# Patient Record
Sex: Female | Born: 1960 | Race: White | Hispanic: No | Marital: Married | Smoking: Never smoker
Health system: Southern US, Community
[De-identification: ages and names within clinical notes are randomized; demographics above are authoritative.]

## PROBLEM LIST (undated history)

## (undated) DIAGNOSIS — Z923 Personal history of irradiation: Secondary | ICD-10-CM

## (undated) DIAGNOSIS — Z803 Family history of malignant neoplasm of breast: Secondary | ICD-10-CM

## (undated) DIAGNOSIS — I7300 Raynaud's syndrome without gangrene: Secondary | ICD-10-CM

## (undated) DIAGNOSIS — Z9221 Personal history of antineoplastic chemotherapy: Secondary | ICD-10-CM

## (undated) DIAGNOSIS — F419 Anxiety disorder, unspecified: Secondary | ICD-10-CM

## (undated) DIAGNOSIS — Z808 Family history of malignant neoplasm of other organs or systems: Secondary | ICD-10-CM

## (undated) DIAGNOSIS — J3800 Paralysis of vocal cords and larynx, unspecified: Secondary | ICD-10-CM

## (undated) DIAGNOSIS — E039 Hypothyroidism, unspecified: Secondary | ICD-10-CM

## (undated) DIAGNOSIS — E7800 Pure hypercholesterolemia, unspecified: Secondary | ICD-10-CM

## (undated) HISTORY — DX: Pure hypercholesterolemia, unspecified: E78.00

## (undated) HISTORY — DX: Family history of malignant neoplasm of breast: Z80.3

## (undated) HISTORY — DX: Family history of malignant neoplasm of other organs or systems: Z80.8

## (undated) HISTORY — PX: KNEE SURGERY: SHX244

## (undated) HISTORY — DX: Anxiety disorder, unspecified: F41.9

## (undated) HISTORY — DX: Raynaud's syndrome without gangrene: I73.00

## (undated) HISTORY — DX: Hypothyroidism, unspecified: E03.9

---
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
## 1999-02-02 ENCOUNTER — Other Ambulatory Visit: Admission: RE | Admit: 1999-02-02 | Discharge: 1999-02-02 | Payer: Self-pay | Admitting: *Deleted

## 1999-04-18 ENCOUNTER — Other Ambulatory Visit: Admission: RE | Admit: 1999-04-18 | Discharge: 1999-04-18 | Payer: Self-pay | Admitting: *Deleted

## 2000-12-04 ENCOUNTER — Other Ambulatory Visit: Admission: RE | Admit: 2000-12-04 | Discharge: 2000-12-04 | Payer: Self-pay | Admitting: *Deleted

## 2001-06-25 ENCOUNTER — Emergency Department (HOSPITAL_COMMUNITY): Admission: EM | Admit: 2001-06-25 | Discharge: 2001-06-25 | Payer: Self-pay | Admitting: Emergency Medicine

## 2001-06-28 ENCOUNTER — Ambulatory Visit (HOSPITAL_COMMUNITY): Admission: RE | Admit: 2001-06-28 | Discharge: 2001-06-28 | Payer: Self-pay | Admitting: Emergency Medicine

## 2001-07-02 ENCOUNTER — Ambulatory Visit (HOSPITAL_COMMUNITY): Admission: RE | Admit: 2001-07-02 | Discharge: 2001-07-02 | Payer: Self-pay | Admitting: Emergency Medicine

## 2001-07-09 ENCOUNTER — Ambulatory Visit: Admission: RE | Admit: 2001-07-09 | Discharge: 2001-07-09 | Payer: Self-pay | Admitting: Emergency Medicine

## 2001-07-23 ENCOUNTER — Ambulatory Visit (HOSPITAL_COMMUNITY): Admission: RE | Admit: 2001-07-23 | Discharge: 2001-07-23 | Payer: Self-pay | Admitting: Emergency Medicine

## 2002-01-27 ENCOUNTER — Other Ambulatory Visit: Admission: RE | Admit: 2002-01-27 | Discharge: 2002-01-27 | Payer: Self-pay | Admitting: Obstetrics and Gynecology

## 2003-05-10 ENCOUNTER — Other Ambulatory Visit: Admission: RE | Admit: 2003-05-10 | Discharge: 2003-05-10 | Payer: Self-pay | Admitting: Obstetrics and Gynecology

## 2003-10-24 ENCOUNTER — Emergency Department (HOSPITAL_COMMUNITY): Admission: EM | Admit: 2003-10-24 | Discharge: 2003-10-24 | Payer: Self-pay | Admitting: Emergency Medicine

## 2003-12-16 ENCOUNTER — Encounter: Admission: RE | Admit: 2003-12-16 | Discharge: 2003-12-16 | Payer: Self-pay | Admitting: Obstetrics and Gynecology

## 2004-11-10 ENCOUNTER — Ambulatory Visit (HOSPITAL_BASED_OUTPATIENT_CLINIC_OR_DEPARTMENT_OTHER): Admission: RE | Admit: 2004-11-10 | Discharge: 2004-11-10 | Payer: Self-pay | Admitting: Orthopedic Surgery

## 2004-11-10 ENCOUNTER — Ambulatory Visit (HOSPITAL_COMMUNITY): Admission: RE | Admit: 2004-11-10 | Discharge: 2004-11-10 | Payer: Self-pay | Admitting: Orthopedic Surgery

## 2005-05-18 ENCOUNTER — Encounter: Admission: RE | Admit: 2005-05-18 | Discharge: 2005-05-18 | Payer: Self-pay | Admitting: Obstetrics and Gynecology

## 2006-02-28 ENCOUNTER — Other Ambulatory Visit: Admission: RE | Admit: 2006-02-28 | Discharge: 2006-02-28 | Payer: Self-pay | Admitting: Family Medicine

## 2006-05-24 ENCOUNTER — Encounter: Admission: RE | Admit: 2006-05-24 | Discharge: 2006-05-24 | Payer: Self-pay | Admitting: Family Medicine

## 2007-06-02 ENCOUNTER — Other Ambulatory Visit: Admission: RE | Admit: 2007-06-02 | Discharge: 2007-06-02 | Payer: Self-pay | Admitting: Family Medicine

## 2007-06-13 ENCOUNTER — Encounter: Admission: RE | Admit: 2007-06-13 | Discharge: 2007-06-13 | Payer: Self-pay | Admitting: Family Medicine

## 2008-07-20 ENCOUNTER — Encounter: Admission: RE | Admit: 2008-07-20 | Discharge: 2008-07-20 | Payer: Self-pay | Admitting: Family Medicine

## 2009-07-26 ENCOUNTER — Encounter: Admission: RE | Admit: 2009-07-26 | Discharge: 2009-07-26 | Payer: Self-pay | Admitting: Obstetrics and Gynecology

## 2010-08-14 ENCOUNTER — Encounter
Admission: RE | Admit: 2010-08-14 | Discharge: 2010-08-14 | Payer: Self-pay | Source: Home / Self Care | Attending: Obstetrics and Gynecology | Admitting: Obstetrics and Gynecology

## 2010-10-06 ENCOUNTER — Other Ambulatory Visit: Payer: Self-pay | Admitting: Obstetrics and Gynecology

## 2010-10-06 DIAGNOSIS — Z780 Asymptomatic menopausal state: Secondary | ICD-10-CM

## 2010-10-13 ENCOUNTER — Other Ambulatory Visit: Payer: Self-pay

## 2010-10-30 ENCOUNTER — Other Ambulatory Visit: Payer: Self-pay

## 2010-11-07 ENCOUNTER — Ambulatory Visit
Admission: RE | Admit: 2010-11-07 | Discharge: 2010-11-07 | Disposition: A | Discharge status: Home / Self Care | Payer: BC Managed Care – PPO | Source: Ambulatory Visit | Attending: Obstetrics and Gynecology | Admitting: Obstetrics and Gynecology

## 2010-11-07 DIAGNOSIS — Z780 Asymptomatic menopausal state: Secondary | ICD-10-CM

## 2011-01-12 NOTE — Op Note (Signed)
NAME:  Allison Hanna, Allison Hanna NO.:  [id number]   MEDICAL RECORD NO.:  [id number]          PATIENT TYPE:  AMB   LOCATION:  DSC                          FACILITY:  [facility]   PHYSICIAN:  Thera Flake., M.D.DATE OF BIRTH:  [birth year]   DATE OF PROCEDURE:  11/10/2004  DATE OF DISCHARGE:                                 OPERATIVE REPORT   INDICATIONS FOR PROCEDURE:  50 year old with a symptomatic MRI proven right  medial meniscal tear thought to be amenable to outpatient surgery.   PREOPERATIVE DIAGNOSIS:  Posterior horn complex tear, medial meniscus of the  right knee.   POSTOPERATIVE DIAGNOSIS:  1.  Posterior horn complex tear, medial meniscus of the right knee.  2.  Anterior cruciate ligament laxity.   OPERATION:  Partial medial meniscectomy.   SURGEON:  Dyke Brackett, M.D.   ANESTHESIA:  General.   DESCRIPTION OF PROCEDURE:  The patient was arthroscoped through an  inferomedial and inferolateral portal.  Systematic inspection of the knee  showed the patient to have a normal patellofemoral joint and a normal  lateral compartment with a normal lateral meniscus.  The ACL, I think,  showed some chronic laxity, I would not call this a complete tear, certainly  did not appear to be an acute, estimated under anesthesia, probably a 1-2+  Lachman with a firm end point.  The main pathology versus symptoms is  confined to the medial compartment showing normal articular cartilage with a  complex tear of the posterior horn of the medial meniscus describing  dissection of 30-40% of the meniscus substance back to a good stable rim.  There was, again, no chondral damage whatsoever.  Meniscectomy was checked  both from the inferior medial and inferior lateral portals, the knee drained  free of fluid, the portals were closed with nylon.  The knee was infiltrated  with Marcaine and morphine 20 mL, 4 mg morphine in the joint and an  additional 10 mL of Marcaine into the portals.  A  light compressive sterile  dressing applied and taken to the recovery room in stable condition.      WDC/MEDQ  D:  11/10/2004  T:  11/10/2004  Job:  [id number]

## 2011-03-18 ENCOUNTER — Inpatient Hospital Stay (INDEPENDENT_AMBULATORY_CARE_PROVIDER_SITE_OTHER)
Admission: RE | Admit: 2011-03-18 | Discharge: 2011-03-18 | Disposition: A | Discharge status: Home / Self Care | Payer: BC Managed Care – PPO | Source: Ambulatory Visit | Attending: Family Medicine | Admitting: Family Medicine

## 2011-03-18 DIAGNOSIS — L0100 Impetigo, unspecified: Secondary | ICD-10-CM

## 2011-07-27 ENCOUNTER — Other Ambulatory Visit: Payer: Self-pay | Admitting: Obstetrics and Gynecology

## 2011-07-27 DIAGNOSIS — N644 Mastodynia: Secondary | ICD-10-CM

## 2011-09-04 ENCOUNTER — Ambulatory Visit
Admission: RE | Admit: 2011-09-04 | Discharge: 2011-09-04 | Disposition: A | Discharge status: Home / Self Care | Payer: BC Managed Care – PPO | Source: Ambulatory Visit | Attending: Obstetrics and Gynecology | Admitting: Obstetrics and Gynecology

## 2011-09-04 DIAGNOSIS — N644 Mastodynia: Secondary | ICD-10-CM

## 2012-08-27 DIAGNOSIS — J3800 Paralysis of vocal cords and larynx, unspecified: Secondary | ICD-10-CM

## 2012-08-27 HISTORY — DX: Paralysis of vocal cords and larynx, unspecified: J38.00

## 2012-09-16 ENCOUNTER — Other Ambulatory Visit: Payer: Self-pay | Admitting: Obstetrics and Gynecology

## 2012-09-16 DIAGNOSIS — Z1231 Encounter for screening mammogram for malignant neoplasm of breast: Secondary | ICD-10-CM

## 2012-09-16 DIAGNOSIS — Z803 Family history of malignant neoplasm of breast: Secondary | ICD-10-CM

## 2012-10-13 ENCOUNTER — Ambulatory Visit: Payer: BC Managed Care – PPO

## 2012-12-03 ENCOUNTER — Ambulatory Visit
Admission: RE | Admit: 2012-12-03 | Discharge: 2012-12-03 | Disposition: A | Discharge status: Home / Self Care | Payer: BC Managed Care – PPO | Source: Ambulatory Visit | Attending: Obstetrics and Gynecology | Admitting: Obstetrics and Gynecology

## 2012-12-03 DIAGNOSIS — Z803 Family history of malignant neoplasm of breast: Secondary | ICD-10-CM

## 2012-12-03 DIAGNOSIS — Z1231 Encounter for screening mammogram for malignant neoplasm of breast: Secondary | ICD-10-CM

## 2013-11-10 ENCOUNTER — Ambulatory Visit (INDEPENDENT_AMBULATORY_CARE_PROVIDER_SITE_OTHER): Payer: BC Managed Care – PPO | Admitting: *Deleted

## 2013-11-10 DIAGNOSIS — I83893 Varicose veins of bilateral lower extremities with other complications: Secondary | ICD-10-CM

## 2013-11-10 DIAGNOSIS — I781 Nevus, non-neoplastic: Secondary | ICD-10-CM

## 2013-11-10 NOTE — Progress Notes (Signed)
Patient came in for consultation re her spider and reticular veins which are painful right over them. Options were a bilateral reflux study to r/o venous hypertension/insufficiency or to try sclerotherapy to see if that alone will treat the pain. Patient reports that by the end of the day she does have pain and heaviness. She would like to try the sclerotherapy first as she has had good results in the past (20 years ago). This treatment is medically necessary as she is experiencing pain. I have made an appt for her in a few weeks.

## 2013-11-25 ENCOUNTER — Ambulatory Visit: Payer: BC Managed Care – PPO | Admitting: *Deleted

## 2014-01-13 ENCOUNTER — Other Ambulatory Visit: Payer: Self-pay

## 2014-01-13 DIAGNOSIS — Z1231 Encounter for screening mammogram for malignant neoplasm of breast: Secondary | ICD-10-CM

## 2014-01-29 ENCOUNTER — Encounter (INDEPENDENT_AMBULATORY_CARE_PROVIDER_SITE_OTHER): Payer: Self-pay

## 2014-01-29 ENCOUNTER — Ambulatory Visit
Admission: RE | Admit: 2014-01-29 | Discharge: 2014-01-29 | Disposition: A | Discharge status: Home / Self Care | Payer: BC Managed Care – PPO | Source: Ambulatory Visit

## 2014-01-29 DIAGNOSIS — Z1231 Encounter for screening mammogram for malignant neoplasm of breast: Secondary | ICD-10-CM

## 2014-06-30 ENCOUNTER — Ambulatory Visit: Payer: BC Managed Care – PPO | Admitting: *Deleted

## 2014-07-09 ENCOUNTER — Encounter: Payer: Self-pay | Admitting: Podiatry

## 2014-07-09 ENCOUNTER — Ambulatory Visit (INDEPENDENT_AMBULATORY_CARE_PROVIDER_SITE_OTHER): Payer: BC Managed Care – PPO | Admitting: Podiatry

## 2014-07-09 VITALS — BP 133/71 | HR 73 | Resp 13

## 2014-07-09 DIAGNOSIS — M722 Plantar fascial fibromatosis: Secondary | ICD-10-CM

## 2014-07-09 DIAGNOSIS — M7741 Metatarsalgia, right foot: Secondary | ICD-10-CM

## 2014-07-09 DIAGNOSIS — M7742 Metatarsalgia, left foot: Secondary | ICD-10-CM

## 2014-07-09 NOTE — Progress Notes (Signed)
   Subjective:    Patient ID: Allison Hanna, female    DOB: [birth year], 53 y.o.   MRN: [id number]  HPI 53 year old female presents the office today requesting new orthotics. She presents a has been a patient of Dr. Gara Kroner, who treated her for plantar fasciitis and a possible tear. She has had orthotics made which he states her symptoms in she states that the orthotics are worn out and she is requesting a new pair. She states that she does get some pain intermittently to the heels particularly if she has not worn the orthotics. She previously on that this will therapy procedure performed ultrasounds of the area which seem to alleviate her symptoms. She states that she has an area of scar tissue and the bottom of her foot for which she points to the medial aspect of her foot near the insertion of the plantar fascia. Also states she gets pain to the ball of her foot bilaterally and she feels as if she has no fat pad and the area. She denies any acute injury or trauma to the area. Denies any pain at this time. No other complaints.   Review of Systems  All other systems reviewed and are negative.      Objective:   Physical Exam Objective: AAO x3, NAD DP/PT pulses palpable bilaterally, CRT less than 3 seconds Protective sensation intact with Simms Weinstein monofilament, vibratory sensation intact, Achilles tendon reflex intact Currently there is no tenderness to the plantar medial tubercle of the calcaneus at the insertion the plantar fascia. There is no pain with lateral compression of the calcaneus or with vibratory sensation. No pain on the posterior aspect or along the course of the Achilles tendon.  There is a small area of hypertrophic tissue within the proximal medial band of the plantar fascia which could correlate their scar tissue or a small plantar fibroma. Subjective the area has not changed in size.There is no overlying edema, erythema, increased warmth. MMT 5/5, ROM WNL Prominent  metatarsal heads plantarly 125 with atrophy of the fat pad. There is no specific areas of pinpoint bony tenderness on the metatarsals or the digits and no pain with vibratory sensation. No pain with range of motion of the digits. No calf pain, swelling, warmth, erythema. No open lesions.       Assessment & Plan:  53 year old female with chronic bilateral plantar fasciitis, currently without symptoms. -Patient does not want any x-rays performed at this time. -Treatment options were discussed including alternatives, risks, complications. -At this time the patient was scanned for new orthotic as the patient is requesting new ones. -Continue with stretching exercises as well as ice to the area. -Follow-up once the orthotics are made. The meantime, call the office with any questions, concerns, change in symptoms.

## 2014-07-14 ENCOUNTER — Ambulatory Visit: Payer: BC Managed Care – PPO | Admitting: *Deleted

## 2014-08-05 ENCOUNTER — Encounter: Payer: Self-pay | Admitting: Podiatry

## 2014-09-14 ENCOUNTER — Encounter: Payer: Self-pay | Admitting: *Deleted

## 2014-09-15 ENCOUNTER — Ambulatory Visit (INDEPENDENT_AMBULATORY_CARE_PROVIDER_SITE_OTHER): Payer: BLUE CROSS/BLUE SHIELD | Admitting: *Deleted

## 2014-09-15 DIAGNOSIS — I8393 Asymptomatic varicose veins of bilateral lower extremities: Secondary | ICD-10-CM

## 2014-09-15 NOTE — Progress Notes (Signed)
X=.3% Sotradecol administered with a 27g butterfly.  Patient received a total of 6cc.  Treated all areas of concern with sclero and CL ([place]). Easy access, tol well, follow prn.  Cutaneous Laser:pulsed mode  810j/cm2 400 ms delay  13 ms Duration 0.5 spot  Total pulses: 603 Total energy 954  Total time::)&  Photos: Yes.    Compression stockings applied: Yes.

## 2015-05-16 ENCOUNTER — Other Ambulatory Visit: Payer: Self-pay

## 2015-05-16 DIAGNOSIS — Z1231 Encounter for screening mammogram for malignant neoplasm of breast: Secondary | ICD-10-CM

## 2015-06-16 ENCOUNTER — Ambulatory Visit
Admission: RE | Admit: 2015-06-16 | Discharge: 2015-06-16 | Disposition: A | Discharge status: Home / Self Care | Payer: BLUE CROSS/BLUE SHIELD | Source: Ambulatory Visit

## 2015-06-16 DIAGNOSIS — Z1231 Encounter for screening mammogram for malignant neoplasm of breast: Secondary | ICD-10-CM

## 2015-11-03 ENCOUNTER — Ambulatory Visit: Payer: BLUE CROSS/BLUE SHIELD | Admitting: Sports Medicine

## 2016-06-05 ENCOUNTER — Other Ambulatory Visit: Payer: Self-pay | Admitting: Obstetrics and Gynecology

## 2016-06-05 DIAGNOSIS — Z1231 Encounter for screening mammogram for malignant neoplasm of breast: Secondary | ICD-10-CM

## 2016-06-18 ENCOUNTER — Ambulatory Visit
Admission: RE | Admit: 2016-06-18 | Discharge: 2016-06-18 | Disposition: A | Discharge status: Home / Self Care | Payer: BLUE CROSS/BLUE SHIELD | Source: Ambulatory Visit | Attending: Obstetrics and Gynecology | Admitting: Obstetrics and Gynecology

## 2016-06-18 DIAGNOSIS — Z1231 Encounter for screening mammogram for malignant neoplasm of breast: Secondary | ICD-10-CM

## 2017-05-14 ENCOUNTER — Other Ambulatory Visit: Payer: Self-pay | Admitting: Obstetrics and Gynecology

## 2017-05-14 DIAGNOSIS — Z1231 Encounter for screening mammogram for malignant neoplasm of breast: Secondary | ICD-10-CM

## 2017-05-16 ENCOUNTER — Other Ambulatory Visit: Payer: Self-pay | Admitting: Obstetrics and Gynecology

## 2017-05-16 DIAGNOSIS — N644 Mastodynia: Secondary | ICD-10-CM

## 2017-06-25 ENCOUNTER — Ambulatory Visit
Admission: RE | Admit: 2017-06-25 | Discharge: 2017-06-25 | Disposition: A | Discharge status: Home / Self Care | Payer: BLUE CROSS/BLUE SHIELD | Source: Ambulatory Visit | Attending: Obstetrics and Gynecology | Admitting: Obstetrics and Gynecology

## 2017-06-25 ENCOUNTER — Ambulatory Visit
Admission: RE | Admit: 2017-06-25 | Discharge: 2017-06-25 | Disposition: A | Discharge status: Home / Self Care | Payer: Managed Care, Other (non HMO) | Source: Ambulatory Visit | Attending: Obstetrics and Gynecology | Admitting: Obstetrics and Gynecology

## 2017-06-25 DIAGNOSIS — N644 Mastodynia: Secondary | ICD-10-CM

## 2018-11-04 ENCOUNTER — Other Ambulatory Visit: Payer: Self-pay | Admitting: Obstetrics and Gynecology

## 2018-11-04 DIAGNOSIS — Z1231 Encounter for screening mammogram for malignant neoplasm of breast: Secondary | ICD-10-CM

## 2018-12-02 ENCOUNTER — Ambulatory Visit: Payer: Managed Care, Other (non HMO)

## 2019-01-13 ENCOUNTER — Ambulatory Visit: Payer: Managed Care, Other (non HMO)

## 2019-01-28 ENCOUNTER — Other Ambulatory Visit: Payer: Self-pay | Admitting: Obstetrics and Gynecology

## 2019-01-28 DIAGNOSIS — N6320 Unspecified lump in the left breast, unspecified quadrant: Secondary | ICD-10-CM

## 2019-02-11 ENCOUNTER — Ambulatory Visit
Admission: RE | Admit: 2019-02-11 | Discharge: 2019-02-11 | Disposition: A | Discharge status: Home / Self Care | Payer: Managed Care, Other (non HMO) | Source: Ambulatory Visit | Attending: Obstetrics and Gynecology | Admitting: Obstetrics and Gynecology

## 2019-02-11 ENCOUNTER — Other Ambulatory Visit: Payer: Self-pay

## 2019-02-11 ENCOUNTER — Other Ambulatory Visit: Payer: Self-pay | Admitting: Obstetrics and Gynecology

## 2019-02-11 DIAGNOSIS — N6320 Unspecified lump in the left breast, unspecified quadrant: Secondary | ICD-10-CM

## 2019-02-12 ENCOUNTER — Ambulatory Visit
Admission: RE | Admit: 2019-02-12 | Discharge: 2019-02-12 | Disposition: A | Discharge status: Home / Self Care | Payer: Managed Care, Other (non HMO) | Source: Ambulatory Visit | Attending: Obstetrics and Gynecology | Admitting: Obstetrics and Gynecology

## 2019-02-12 ENCOUNTER — Other Ambulatory Visit: Payer: Self-pay

## 2019-02-12 DIAGNOSIS — N6320 Unspecified lump in the left breast, unspecified quadrant: Secondary | ICD-10-CM

## 2019-02-13 ENCOUNTER — Telehealth: Payer: Self-pay | Admitting: *Deleted

## 2019-02-13 NOTE — Telephone Encounter (Signed)
Confirmed [facility] for 02/18/19 at 1215.  Instructions and contact information given.

## 2019-02-17 ENCOUNTER — Other Ambulatory Visit: Payer: Self-pay | Admitting: *Deleted

## 2019-02-17 DIAGNOSIS — Z170 Estrogen receptor positive status [ER+]: Secondary | ICD-10-CM | POA: Insufficient documentation

## 2019-02-17 DIAGNOSIS — C50512 Malignant neoplasm of lower-outer quadrant of left female breast: Secondary | ICD-10-CM

## 2019-02-18 ENCOUNTER — Encounter: Payer: Self-pay | Admitting: Oncology

## 2019-02-18 ENCOUNTER — Inpatient Hospital Stay (HOSPITAL_BASED_OUTPATIENT_CLINIC_OR_DEPARTMENT_OTHER): Payer: Managed Care, Other (non HMO) | Admitting: Oncology

## 2019-02-18 ENCOUNTER — Encounter: Payer: Self-pay | Admitting: Physical Therapy

## 2019-02-18 ENCOUNTER — Inpatient Hospital Stay: Payer: Managed Care, Other (non HMO)

## 2019-02-18 ENCOUNTER — Ambulatory Visit: Payer: Managed Care, Other (non HMO) | Attending: General Surgery | Admitting: Physical Therapy

## 2019-02-18 ENCOUNTER — Other Ambulatory Visit: Payer: Self-pay

## 2019-02-18 ENCOUNTER — Other Ambulatory Visit: Payer: Self-pay | Admitting: General Surgery

## 2019-02-18 ENCOUNTER — Ambulatory Visit
Admission: RE | Admit: 2019-02-18 | Discharge: 2019-02-18 | Disposition: A | Discharge status: Home / Self Care | Payer: Managed Care, Other (non HMO) | Source: Ambulatory Visit | Attending: Radiation Oncology | Admitting: Radiation Oncology

## 2019-02-18 VITALS — BP 137/74 | HR 71 | Temp 98.2°F | Resp 18 | Ht 66.5 in | Wt 158.7 lb

## 2019-02-18 DIAGNOSIS — C50512 Malignant neoplasm of lower-outer quadrant of left female breast: Secondary | ICD-10-CM | POA: Insufficient documentation

## 2019-02-18 DIAGNOSIS — R293 Abnormal posture: Secondary | ICD-10-CM | POA: Insufficient documentation

## 2019-02-18 DIAGNOSIS — E039 Hypothyroidism, unspecified: Secondary | ICD-10-CM

## 2019-02-18 DIAGNOSIS — Z170 Estrogen receptor positive status [ER+]: Secondary | ICD-10-CM

## 2019-02-18 LAB — CBC WITH DIFFERENTIAL (CANCER CENTER ONLY)
Abs Immature Granulocytes: 0.01 10*3/uL (ref 0.00–0.07)
Basophils Absolute: 0.1 10*3/uL (ref 0.0–0.1)
Basophils Relative: 2 %
Eosinophils Absolute: 0.1 10*3/uL (ref 0.0–0.5)
Eosinophils Relative: 2 %
HCT: 36.7 % (ref 36.0–46.0)
Hemoglobin: 12.3 g/dL (ref 12.0–15.0)
Immature Granulocytes: 0 %
Lymphocytes Relative: 23 %
Lymphs Abs: 1 10*3/uL (ref 0.7–4.0)
MCH: 30.5 pg (ref 26.0–34.0)
MCHC: 33.5 g/dL (ref 30.0–36.0)
MCV: 91.1 fL (ref 80.0–100.0)
Monocytes Absolute: 0.4 10*3/uL (ref 0.1–1.0)
Monocytes Relative: 8 %
Neutro Abs: 2.9 10*3/uL (ref 1.7–7.7)
Neutrophils Relative %: 65 %
Platelet Count: 297 10*3/uL (ref 150–400)
RBC: 4.03 MIL/uL (ref 3.87–5.11)
RDW: 11.9 % (ref 11.5–15.5)
WBC Count: 4.4 10*3/uL (ref 4.0–10.5)
nRBC: 0 % (ref 0.0–0.2)

## 2019-02-18 LAB — CMP (CANCER CENTER ONLY)
ALT: 14 U/L (ref 0–44)
AST: 16 U/L (ref 15–41)
Albumin: 4.1 g/dL (ref 3.5–5.0)
Alkaline Phosphatase: 47 U/L (ref 38–126)
Anion gap: 8 (ref 5–15)
BUN: 16 mg/dL (ref 6–20)
CO2: 29 mmol/L (ref 22–32)
Calcium: 9.2 mg/dL (ref 8.9–10.3)
Chloride: 99 mmol/L (ref 98–111)
Creatinine: 0.95 mg/dL (ref 0.44–1.00)
GFR, Est AFR Am: >60 mL/min (ref >60)
GFR, Estimated: >60 mL/min (ref >60)
Glucose, Bld: 92 mg/dL (ref 70–99)
Potassium: 4 mmol/L (ref 3.5–5.1)
Sodium: 136 mmol/L (ref 135–145)
Total Bilirubin: 0.3 mg/dL (ref 0.3–1.2)
Total Protein: 7.5 g/dL (ref 6.5–8.1)

## 2019-02-18 NOTE — Progress Notes (Signed)
Radiation Oncology         [phone] ________________________________  Multidisciplinary Breast Oncology Clinic [facility]) Initial Outpatient Consultation  Name: Allison Hanna MRN: [id number]  Date: 02/18/2019  DOB: 03-Mar-1961  WE:[id number], Mauro Kaufmann, MD  Fanny Skates, MD   REFERRING PHYSICIAN: Fanny Skates, MD  DIAGNOSIS: The encounter diagnosis was Malignant neoplasm of lower-outer quadrant of left breast of female, estrogen receptor positive ([place]).  Stage IA (T1b, N0) Left Breast LOQ, Invasive Mammary Carcinoma, ER+ / PR- / Her2+, Grade- pending    ICD-10-CM   1. Malignant neoplasm of lower-outer quadrant of left breast of female, estrogen receptor positive ([place])  C50.512    Z17.0     HISTORY OF PRESENT ILLNESS::Allison Hanna is a 58 y.o. female who is presenting to the office today for evaluation of her newly diagnosed breast cancer. She is doing well overall.   Pt presented after palpating a mass in the left breast.  She underwent bilateral diagnostic mammography with tomography and left breast ultrasonography at [facility] on 02/11/19 showing: a suspicious left breast mass at 4 o'clock, 5cmfn, measuring 8 x 8 x 5 mm.  Biopsy on 02/12/19 showed: invasive mammary carcinoma in the left breast 4 o'clock position. Prognostic indicators significant for: estrogen receptor, 100% positive with strong staining intensity. Progesterone receptor negative. Proliferation marker Ki67 at 25%. HER2 positive.  Menarche: 58 years old Age at first live birth: 58 years old GP: GxP3 LMP: 2007 Contraceptive: yes, pill for about 8 years, no complications HRT: no   The patient was referred today for presentation in the multidisciplinary conference.  Radiology studies and pathology slides were presented there for review and discussion of treatment options.  A consensus was discussed regarding potential next steps.  PREVIOUS RADIATION THERAPY: No  PAST MEDICAL HISTORY:  has a past  medical history of Anxiety, High cholesterol, Hypothyroid, Paralyzed vocal cords, and Raynauds syndrome.    PAST SURGICAL HISTORY: Past Surgical History:  Procedure Laterality Date   KNEE SURGERY Bilateral    arthroscopy for menicus tears   WRIST SURGERY      FAMILY HISTORY: family history includes Breast cancer in her paternal aunt and paternal aunt; Breast cancer (age of onset: 96) in her mother; Heart attack in her father; Skin cancer in her brother; Uterine cancer in her paternal aunt.  SOCIAL HISTORY:  reports that she has never smoked. She has never used smokeless tobacco. She reports current alcohol use of about 2.0 standard drinks of alcohol per week. She reports that she does not use drugs.  ALLERGIES: Patient has no known allergies.  MEDICATIONS:  Current Outpatient Medications  Medication Sig Dispense Refill   calcium carbonate (OS-CAL) 600 MG TABS tablet Take 1,200 mg by mouth 2 (two) times daily with a meal.     Cholecalciferol (VITAMIN D3) 25 MCG (1000 UT) CAPS Take 2,000 Units by mouth daily.     estradiol (ESTRACE) 0.1 MG/GM vaginal cream Place 1 Applicatorful vaginally at bedtime.     Levothyroxine Sodium (SYNTHROID PO) Take 75 mcg by mouth daily.      Magnesium 250 MG TABS Take 250 mg by mouth daily.     vitamin C (ASCORBIC ACID) 500 MG tablet Take 500 mg by mouth daily.     No current facility-administered medications for this encounter.     REVIEW OF SYSTEMS:  REVIEW OF SYSTEMS: A 10+ POINT REVIEW OF SYSTEMS WAS OBTAINED including neurology, dermatology, psychiatry, cardiac, respiratory, lymph, extremities, GI, GU, musculoskeletal, constitutional, reproductive,  HEENTVinnie Hanna reports loss of sleep, breast soreness and pain with palpable lump, back pain, anxiety, and wearing glasses. The patient denies unusual headaches, visual changes, nausea, vomiting, stiff neck, dizziness, or gait imbalance. There has been no cough, phlegm production, or pleurisy, no chest  pain or pressure, and no change in bowel or bladder habits. The patient denies fever, rash, bleeding, unexplained fatigue or unexplained weight loss. A detailed review of systems was otherwise entirely negative.   PHYSICAL EXAM:  Vitals with BMI 02/18/2019  Height 5' 6.5"  Weight 158 lbs 11 oz  BMI 99.35  Systolic 701  Diastolic 74  Pulse 71  Respirations 18   Lungs are clear to auscultation bilaterally. Heart has regular rate and rhythm. No palpable cervical, supraclavicular, or axillary adenopathy. Abdomen soft, non-tender, normal bowel sounds. Breast: Right breast with no palpable mass, nipple discharge, or bleeding. LOQ of left breast pt has some bruising from biopsy as well as a small palpable mass measuring approximately 1 cm, which is superficial on exam but no skin involvement.     ECOG = 0  LABORATORY DATA:  Lab Results  Component Value Date   WBC 4.4 02/18/2019   HGB 12.3 02/18/2019   HCT 36.7 02/18/2019   MCV 91.1 02/18/2019   PLT 297 02/18/2019   Lab Results  Component Value Date   NA 136 02/18/2019   K 4.0 02/18/2019   CL 99 02/18/2019   CO2 29 02/18/2019   Lab Results  Component Value Date   ALT 14 02/18/2019   AST 16 02/18/2019   ALKPHOS 47 02/18/2019   BILITOT 0.3 02/18/2019    PULMONARY FUNCTION TEST:   Recent Review Flowsheet Data    There is no flowsheet data to display.      RADIOGRAPHY: US Breast Ltd Uni Left Inc Axilla  Result Date: 02/11/2019 CLINICAL DATA:  Patient complains of a palpable mass in the left breast. EXAM: DIGITAL DIAGNOSTIC BILATERAL MAMMOGRAM WITH CAD AND TOMO ULTRASOUND LEFT BREAST COMPARISON:  Previous exam(s). ACR Breast Density Category b: There are scattered areas of fibroglandular density. FINDINGS: There is a spiculated mass in the lower outer quadrant of the left breast. There are no malignant type microcalcifications in the left breast. No suspicious mass or malignant type microcalcifications identified in the right  breast. Mammographic images were processed with CAD. On physical exam, I palpate a discrete mass in the left breast at 4 o'clock 5 cm from the nipple. Targeted ultrasound is performed, showing an irregular hypoechoic mass in the left breast at 4 o'clock 5 cm from the nipple measuring 8 x 8 x 5 mm. Sonographic evaluation of the left axilla does not show any enlarged adenopathy. IMPRESSION: Suspicious left breast mass. RECOMMENDATION: Ultrasound-guided core biopsy of the mass in the 4 o'clock region of the left breast is recommended. The biopsy will be scheduled the patient's convenience. I have discussed the findings and recommendations with the patient. Results were also provided in writing at the conclusion of the visit. If applicable, a reminder letter will be sent to the patient regarding the next appointment. BI-RADS CATEGORY  5: Highly suggestive of malignancy. Electronically Signed   By: Lillia Mountain M.D.   On: 02/11/2019 13:52   Mm Diag Breast Tomo Bilateral  Result Date: 02/11/2019 CLINICAL DATA:  Patient complains of a palpable mass in the left breast. EXAM: DIGITAL DIAGNOSTIC BILATERAL MAMMOGRAM WITH CAD AND TOMO ULTRASOUND LEFT BREAST COMPARISON:  Previous exam(s). ACR Breast Density Category b: There are scattered  areas of fibroglandular density. FINDINGS: There is a spiculated mass in the lower outer quadrant of the left breast. There are no malignant type microcalcifications in the left breast. No suspicious mass or malignant type microcalcifications identified in the right breast. Mammographic images were processed with CAD. On physical exam, I palpate a discrete mass in the left breast at 4 o'clock 5 cm from the nipple. Targeted ultrasound is performed, showing an irregular hypoechoic mass in the left breast at 4 o'clock 5 cm from the nipple measuring 8 x 8 x 5 mm. Sonographic evaluation of the left axilla does not show any enlarged adenopathy. IMPRESSION: Suspicious left breast mass.  RECOMMENDATION: Ultrasound-guided core biopsy of the mass in the 4 o'clock region of the left breast is recommended. The biopsy will be scheduled the patient's convenience. I have discussed the findings and recommendations with the patient. Results were also provided in writing at the conclusion of the visit. If applicable, a reminder letter will be sent to the patient regarding the next appointment. BI-RADS CATEGORY  5: Highly suggestive of malignancy. Electronically Signed   By: Lillia Mountain M.D.   On: 02/11/2019 13:52   Mm Clip Placement Left  Result Date: 02/12/2019 CLINICAL DATA:  Post ultrasound-guided biopsy of the mass in the left breast at the 4 o'clock position. EXAM: DIAGNOSTIC LEFT MAMMOGRAM POST ULTRASOUND BIOPSY COMPARISON:  Previous exam(s). FINDINGS: Mammographic images were obtained following ultrasound guided biopsy of a mass in the left breast at the 4 o'clock position. A ribbon shaped biopsy marking clip is present at the site of the biopsied mass in the left breast at the 4 o'clock position. IMPRESSION: Ribbon shaped biopsy marking clip at site of biopsied mass in the left breast at the 4 o'clock position. Final Assessment: Post Procedure Mammograms for Marker Placement Electronically Signed   By: Everlean Alstrom M.D.   On: 02/12/2019 14:47   [place] Lt Breast Bx W Loc Dev 1st Lesion Img Bx Spec US Guide  Addendum Date: 02/17/2019   ADDENDUM REPORT: 02/17/2019 15:11 ADDENDUM: Pathology revealed INVASIVE MAMMARY CARCINOMA of the LEFT breast, 4 o'clock position. This was found to be concordant by Dr. Everlean Alstrom. Pathology results were discussed with the patient by telephone. The patient reported doing well after the biopsy with tenderness at the site. Post biopsy instructions and care were reviewed and questions were answered. The patient was encouraged to call [facility] for any additional concerns. The patient was referred to [facility] on February 18, 2019. Pathology results reported by Stacie Acres, RN on 02/17/2019. Electronically Signed   By: Everlean Alstrom M.D.   On: 02/17/2019 15:11   Result Date: 02/17/2019 CLINICAL DATA:  58 year old female with a suspicious mass in the left breast at the 4 o'clock position. EXAM: ULTRASOUND GUIDED LEFT BREAST CORE NEEDLE BIOPSY COMPARISON:  Previous exam(s). FINDINGS: I met with the patient and we discussed the procedure of ultrasound-guided biopsy, including benefits and alternatives. We discussed the high likelihood of a successful procedure. We discussed the risks of the procedure, including infection, bleeding, tissue injury, clip migration, and inadequate sampling. Informed written consent was given. The usual time-out protocol was performed immediately prior to the procedure. Lesion quadrant: Lower outer Using sterile technique and 1% Lidocaine as local anesthetic, under direct ultrasound visualization, a 12 gauge spring-loaded device was used to perform biopsy of the mass in the left breast at the 4 o'clock  position using a lateral to medial approach. At the conclusion of the procedure a ribbon shaped tissue marker clip was deployed into the biopsy cavity. Follow up 2 view mammogram was performed and dictated separately. IMPRESSION: Ultrasound guided biopsy of the mass in the left breast at the 4 o'clock position. No apparent complications. Electronically Signed: By: Everlean Alstrom M.D. On: 02/12/2019 14:49      IMPRESSION: Stage IA (T1b, N0) Left Breast LOQ, Invasive Mammary (ductal) Carcinoma, ER+ / PR- / Her2+, Grade- pending  Patient will be a good candidate for breast conservation with radiotherapy to left breast. We discussed the general course of radiation, potential side effects, and toxicities with radiation and the patient is interested in this approach.    PLAN:  1. Left lumpectomy with SLN 2. Adjuvant chemotherapy  with paclitaxel and trastuzumab (trastuzumab to be continued to complete 1 year) 3. Adjuvant radiation therapy 4. Aromatase inhibitor 5. Genetics testing   ------------------------------------------------  Blair Promise, PhD, MD This document serves as a record of services personally performed by Gery Pray, MD. It was created on his behalf by Mary-Margaret Loma Messing, a trained medical scribe. The creation of this record is based on the scribe's personal observations and the provider's statements to them. This document has been checked and approved by the attending provider.

## 2019-02-18 NOTE — Progress Notes (Signed)
START ON PATHWAY REGIMEN - Breast   Paclitaxel Weekly + Trastuzumab Weekly:   Administer weekly:     Paclitaxel      Trastuzumab-xxxx      Trastuzumab-xxxx   **Always confirm dose/schedule in your pharmacy ordering system**  Trastuzumab (Maintenance - NO Loading Dose):   A cycle is every 21 days:     Trastuzumab-xxxx   **Always confirm dose/schedule in your pharmacy ordering system**  Patient Characteristics: Postoperative without Neoadjuvant Therapy (Pathologic Staging), Invasive Disease, Adjuvant Therapy, HER2 Positive, ER Positive, Node Negative, pT1b, pN0/N46m, Chemotherapy Indicated Therapeutic Status: Postoperative without Neoadjuvant Therapy (Pathologic Staging) AJCC Grade: G2 AJCC N Category: pN0 AJCC M Category: cM0 ER Status: Positive (+) AJCC 8 Stage Grouping: IA HER2 Status: Positive (+) Oncotype Dx Recurrence Score: Not Appropriate AJCC T Category: pT1 PR Status: Negative (-) Intervention Indicated: Chemotherapy Intent of Therapy: Curative Intent, Not Discussed with Patient

## 2019-02-18 NOTE — Progress Notes (Signed)
[facility]  [phone] [phone]     ID: BOBBI YOUNT DOB: 1960/09/21  MR#: [id number]  CBJ#:[id number]  Patient Care Team: Merrilee Seashore, MD as PCP - General (Internal Medicine) Mauro Kaufmann, RN as Oncology Nurse Navigator Rockwell Germany, RN as Oncology Nurse Navigator Yerick Eggebrecht, Virgie Dad, MD as Consulting Physician (Oncology) Gery Pray, MD as Consulting Physician (Radiation Oncology) Fanny Skates, MD as Consulting Physician (General Surgery) Brien Few, MD as Consulting Physician (Obstetrics and Gynecology) Chauncey Cruel, MD OTHER MD:  CHIEF COMPLAINT: Estrogen receptor positive breast cancer  CURRENT TREATMENT: Awaiting definitive surgery   HISTORY OF CURRENT ILLNESS: "Katrinka" presented with a palpable left breast mass. She initially experienced soreness to her left breast around April 2020 and tried to get a mammogram at this point, but due to pandemic concerns, the scan was delayed.  She did not actually feel a mass in the breast until June 1.  She underwent bilateral diagnostic mammography with tomography and left breast ultrasonography at [facility] on 02/11/2019 showing: breast density category B; irregular hyperechoic mass in the left breast at 4 o'clock 5 cm from the nipple measuring 8 mm; no enlarged adenopathy in the left axilla.  Accordingly on 02/12/2019 she proceeded to biopsy of the left breast area in question. The pathology from this procedure ([id number]) showed: invasive mammary carcinoma, e-cadherin positive. Prognostic indicators significant for: estrogen receptor, 100% positive with strong staining intensity and progesterone receptor, 0% negative. Proliferation marker Ki67 at 25%. HER2 positive by immunohistochemistry, (3+).  The patient's subsequent history is as detailed below.   INTERVAL HISTORY: Lu was evaluated in the multidisciplinary breast cancer clinic on 02/18/2019. Her case  was also presented at the multidisciplinary breast cancer conference on the same day. At that time a preliminary plan was proposed: Breast conserving surgery with sentinel lymph node sampling, port placement, adjuvant chemotherapy to consist of paclitaxel and trastuzumab, adjuvant radiation, antiestrogens, and genetics   REVIEW OF SYSTEMS: Beata reports loss of sleep, breast soreness and pain with palpable lump, back pain, anxiety, and wearing glasses. The patient denies unusual headaches, visual changes, nausea, vomiting, stiff neck, dizziness, or gait imbalance. There has been no cough, phlegm production, or pleurisy, no chest pain or pressure, and no change in bowel or bladder habits. The patient denies fever, rash, bleeding, unexplained fatigue or unexplained weight loss. A detailed review of systems was otherwise entirely negative.   PAST MEDICAL HISTORY: Past Medical History:  Diagnosis Date   Anxiety    High cholesterol    Hypothyroid    Paralyzed vocal cords    Raynauds syndrome   History of palpitations (resolved)   PAST SURGICAL HISTORY: Past Surgical History:  Procedure Laterality Date   KNEE SURGERY Bilateral    arthroscopy for menicus tears   WRIST SURGERY      FAMILY HISTORY: Family History  Problem Relation Age of Onset   Breast cancer Mother 51   Heart attack Father    Skin cancer Brother    Breast cancer Paternal Aunt    Breast cancer Paternal Aunt    Uterine cancer Paternal 65   Patient's father was 60 years old when he died from heart attack. Patient's mother died at age 75. She was diagnosed with breast cancer at age 66. Two paternal aunts were diagnosed with breast cancer at older ages, one of which also had uterine cancer. She has 1 brother. He has a history of skin cancer.    GYNECOLOGIC  HISTORY:  No LMP recorded. Patient is postmenopausal. Menarche: 58 years old Age at first live birth: 58 years old [place] P 3 LMP 2007 Contraceptive:  yes, pill for about 8 years, no complications HRT no  Hysterectomy? no BSO? 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 She is married. [name] is in charge of operations for [employer]. She lives at home with her husband. [name], age 24, lives in [place], [place] as a Research scientist (life sciences). [name], age 44, lives in [place] as a Careers adviser. [name], age 21, lives in [place] as a Ship broker and works part-time in [employer]. She has no grandchildren.     ADVANCED DIRECTIVES: [name] is automatically her HCPOA.   HEALTH MAINTENANCE: Social History   Tobacco Use   Smoking status: Never Smoker   Smokeless tobacco: Never Used  Substance Use Topics   Alcohol use: Yes    Alcohol/week: 2.0 standard drinks    Types: 2 Standard drinks or equivalent per week   Drug use: Never     Colonoscopy: 2010  PAP: 08/2018  Bone density: 2019, osteopenia   No Known Allergies  Current Outpatient Medications  Medication Sig Dispense Refill   calcium carbonate (OS-CAL) 600 MG TABS tablet Take 1,200 mg by mouth 2 (two) times daily with a meal.     Cholecalciferol (VITAMIN D3) 25 MCG (1000 UT) CAPS Take 2,000 Units by mouth daily.     estradiol (ESTRACE) 0.1 MG/GM vaginal cream Place 1 Applicatorful vaginally at bedtime.     Levothyroxine Sodium (SYNTHROID PO) Take 75 mcg by mouth daily.      Magnesium 250 MG TABS Take 250 mg by mouth daily.     vitamin C (ASCORBIC ACID) 500 MG tablet Take 500 mg by mouth daily.     No current facility-administered medications for this visit.     OBJECTIVE: Middle-aged white woman in no acute distress  Vitals:   02/18/19 1256  BP: 137/74  Pulse: 71  Resp: 18  Temp: 98.2 F (36.8 C)  SpO2: 98%     Body mass index is 25.23 kg/m.   Wt Readings from Last 3 Encounters:  02/18/19 158 lb 11.2 oz (72 kg)      ECOG FS:1 -  Symptomatic but completely ambulatory  Ocular: Sclerae unicteric, pupils round and equal Ear-nose-throat: Oropharynx clear and moist Lymphatic: No cervical or supraclavicular adenopathy Lungs no rales or rhonchi Heart regular rate and rhythm Abd soft, nontender, positive bowel sounds MSK no focal spinal tenderness, no joint edema Neuro: non-focal, well-oriented, appropriate affect Breasts: The right breast is status post recent biopsy.  There is a minimal ecchymosis.  There is no skin or nipple change of concern.  The left breast is benign.  Both axillae are benign.  LAB RESULTS:  CMP     Component Value Date/Time   NA 136 02/18/2019 1242   K 4.0 02/18/2019 1242   CL 99 02/18/2019 1242   CO2 29 02/18/2019 1242   GLUCOSE 92 02/18/2019 1242   BUN 16 02/18/2019 1242   CREATININE 0.95 02/18/2019 1242   CALCIUM 9.2 02/18/2019 1242   PROT 7.5 02/18/2019 1242   ALBUMIN 4.1 02/18/2019 1242   AST 16 02/18/2019 1242   ALT 14 02/18/2019 1242   ALKPHOS 47 02/18/2019 1242   BILITOT 0.3 02/18/2019 1242   GFRNONAA >60 02/18/2019 1242   GFRAA >60 02/18/2019 1242  No results found for: TOTALPROTELP, ALBUMINELP, A1GS, A2GS, BETS, BETA2SER, GAMS, MSPIKE, SPEI  No results found for: KPAFRELGTCHN, LAMBDASER, [facility]  Lab Results  Component Value Date   WBC 4.4 02/18/2019   NEUTROABS 2.9 02/18/2019   HGB 12.3 02/18/2019   HCT 36.7 02/18/2019   MCV 91.1 02/18/2019   PLT 297 02/18/2019    @LASTCHEMISTRY @  No results found for: LABCA2  No components found for: [id number]  No results for input(s): INR in the last 168 hours.  No results found for: LABCA2  No results found for: NFA213  No results found for: YQM578  No results found for: ION629  No results found for: CA2729  No components found for: HGQUANT  No results found for: CEA1 / No results found for: CEA1   No results found for: AFPTUMOR  No results found for: CHROMOGRNA  No results found for:  PSA1  Appointment on 02/18/2019  Component Date Value Ref Range Status   Sodium 02/18/2019 136  135 - 145 mmol/L Final   Potassium 02/18/2019 4.0  3.5 - 5.1 mmol/L Final   Chloride 02/18/2019 99  98 - 111 mmol/L Final   CO2 02/18/2019 29  22 - 32 mmol/L Final   Glucose, Bld 02/18/2019 92  70 - 99 mg/dL Final   BUN 02/18/2019 16  6 - 20 mg/dL Final   Creatinine 02/18/2019 0.95  0.44 - 1.00 mg/dL Final   Calcium 02/18/2019 9.2  8.9 - 10.3 mg/dL Final   Total Protein 02/18/2019 7.5  6.5 - 8.1 g/dL Final   Albumin 02/18/2019 4.1  3.5 - 5.0 g/dL Final   AST 02/18/2019 16  15 - 41 U/L Final   ALT 02/18/2019 14  0 - 44 U/L Final   Alkaline Phosphatase 02/18/2019 47  38 - 126 U/L Final   Total Bilirubin 02/18/2019 0.3  0.3 - 1.2 mg/dL Final   GFR, Est Non Af Am 02/18/2019 >60  >60 mL/min Final   GFR, Est AFR Am 02/18/2019 >60  >60 mL/min Final   Anion gap 02/18/2019 8  5 - 15 Final   Performed at [facility], [place] [address] [place], [place] [address]   WBC Count 02/18/2019 4.4  4.0 - 10.5 K/uL Final   RBC 02/18/2019 4.03  3.87 - 5.11 MIL/uL Final   Hemoglobin 02/18/2019 12.3  12.0 - 15.0 g/dL Final   HCT 02/18/2019 36.7  36.0 - 46.0 % Final   MCV 02/18/2019 91.1  80.0 - 100.0 fL Final   MCH 02/18/2019 30.5  26.0 - 34.0 pg Final   MCHC 02/18/2019 33.5  30.0 - 36.0 g/dL Final   RDW 02/18/2019 11.9  11.5 - 15.5 % Final   Platelet Count 02/18/2019 297  150 - 400 K/uL Final   nRBC 02/18/2019 0.0  0.0 - 0.2 % Final   Neutrophils Relative % 02/18/2019 65  % Final   Neutro Abs 02/18/2019 2.9  1.7 - 7.7 K/uL Final   Lymphocytes Relative 02/18/2019 23  % Final   Lymphs Abs 02/18/2019 1.0  0.7 - 4.0 K/uL Final   Monocytes Relative 02/18/2019 8  % Final   Monocytes Absolute 02/18/2019 0.4  0.1 - 1.0 K/uL Final   Eosinophils Relative 02/18/2019 2  % Final   Eosinophils Absolute 02/18/2019 0.1  0.0 - 0.5 K/uL Final   Basophils Relative  02/18/2019 2  % Final   Basophils Absolute 02/18/2019 0.1  0.0 - 0.1 K/uL Final   Immature Granulocytes 02/18/2019 0  %  Final   Abs Immature Granulocytes 02/18/2019 0.01  0.00 - 0.07 K/uL Final   Performed at [facility], [place] [address], [place], [place] [address]    (this displays the last labs from the last 3 days)  No results found for: TOTALPROTELP, ALBUMINELP, A1GS, A2GS, BETS, BETA2SER, GAMS, MSPIKE, SPEI (this displays SPEP labs)  No results found for: KPAFRELGTCHN, LAMBDASER, KAPLAMBRATIO (kappa/lambda light chains)  No results found for: HGBA, HGBA2QUANT, HGBFQUANT, HGBSQUAN (Hemoglobinopathy evaluation)   No results found for: LDH  No results found for: IRON, TIBC, IRONPCTSAT (Iron and TIBC)  No results found for: FERRITIN  Urinalysis No results found for: COLORURINE, APPEARANCEUR, LABSPEC, PHURINE, GLUCOSEU, HGBUR, BILIRUBINUR, KETONESUR, PROTEINUR, UROBILINOGEN, NITRITE, LEUKOCYTESUR   STUDIES: US Breast Ltd Uni Left Inc Axilla  Result Date: 02/11/2019 CLINICAL DATA:  Patient complains of a palpable mass in the left breast. EXAM: DIGITAL DIAGNOSTIC BILATERAL MAMMOGRAM WITH CAD AND TOMO ULTRASOUND LEFT BREAST COMPARISON:  Previous exam(s). ACR Breast Density Category b: There are scattered areas of fibroglandular density. FINDINGS: There is a spiculated mass in the lower outer quadrant of the left breast. There are no malignant type microcalcifications in the left breast. No suspicious mass or malignant type microcalcifications identified in the right breast. Mammographic images were processed with CAD. On physical exam, I palpate a discrete mass in the left breast at 4 o'clock 5 cm from the nipple. Targeted ultrasound is performed, showing an irregular hypoechoic mass in the left breast at 4 o'clock 5 cm from the nipple measuring 8 x 8 x 5 mm. Sonographic evaluation of the left axilla does not show any enlarged adenopathy. IMPRESSION: Suspicious  left breast mass. RECOMMENDATION: Ultrasound-guided core biopsy of the mass in the 4 o'clock region of the left breast is recommended. The biopsy will be scheduled the patient's convenience. I have discussed the findings and recommendations with the patient. Results were also provided in writing at the conclusion of the visit. If applicable, a reminder letter will be sent to the patient regarding the next appointment. BI-RADS CATEGORY  5: Highly suggestive of malignancy. Electronically Signed   By: Lillia Mountain M.D.   On: 02/11/2019 13:52   Mm Diag Breast Tomo Bilateral  Result Date: 02/11/2019 CLINICAL DATA:  Patient complains of a palpable mass in the left breast. EXAM: DIGITAL DIAGNOSTIC BILATERAL MAMMOGRAM WITH CAD AND TOMO ULTRASOUND LEFT BREAST COMPARISON:  Previous exam(s). ACR Breast Density Category b: There are scattered areas of fibroglandular density. FINDINGS: There is a spiculated mass in the lower outer quadrant of the left breast. There are no malignant type microcalcifications in the left breast. No suspicious mass or malignant type microcalcifications identified in the right breast. Mammographic images were processed with CAD. On physical exam, I palpate a discrete mass in the left breast at 4 o'clock 5 cm from the nipple. Targeted ultrasound is performed, showing an irregular hypoechoic mass in the left breast at 4 o'clock 5 cm from the nipple measuring 8 x 8 x 5 mm. Sonographic evaluation of the left axilla does not show any enlarged adenopathy. IMPRESSION: Suspicious left breast mass. RECOMMENDATION: Ultrasound-guided core biopsy of the mass in the 4 o'clock region of the left breast is recommended. The biopsy will be scheduled the patient's convenience. I have discussed the findings and recommendations with the patient. Results were also provided in writing at the conclusion of the visit. If applicable, a reminder letter will be sent to the patient regarding the next appointment. BI-RADS  CATEGORY  5: Highly suggestive of malignancy. Electronically Signed   By: Lillia Mountain M.D.   On: 02/11/2019 13:52   Mm Clip Placement Left  Result Date: 02/12/2019 CLINICAL DATA:  Post ultrasound-guided biopsy of the mass in the left breast at the 4 o'clock position. EXAM: DIAGNOSTIC LEFT MAMMOGRAM POST ULTRASOUND BIOPSY COMPARISON:  Previous exam(s). FINDINGS: Mammographic images were obtained following ultrasound guided biopsy of a mass in the left breast at the 4 o'clock position. A ribbon shaped biopsy marking clip is present at the site of the biopsied mass in the left breast at the 4 o'clock position. IMPRESSION: Ribbon shaped biopsy marking clip at site of biopsied mass in the left breast at the 4 o'clock position. Final Assessment: Post Procedure Mammograms for Marker Placement Electronically Signed   By: Everlean Alstrom M.D.   On: 02/12/2019 14:47   [place] Lt Breast Bx W Loc Dev 1st Lesion Img Bx Spec US Guide  Addendum Date: 02/17/2019   ADDENDUM REPORT: 02/17/2019 15:11 ADDENDUM: Pathology revealed INVASIVE MAMMARY CARCINOMA of the LEFT breast, 4 o'clock position. This was found to be concordant by Dr. Everlean Alstrom. Pathology results were discussed with the patient by telephone. The patient reported doing well after the biopsy with tenderness at the site. Post biopsy instructions and care were reviewed and questions were answered. The patient was encouraged to call [facility] for any additional concerns. The patient was referred to [facility] on February 18, 2019. Pathology results reported by Stacie Acres, RN on 02/17/2019. Electronically Signed   By: Everlean Alstrom M.D.   On: 02/17/2019 15:11   Result Date: 02/17/2019 CLINICAL DATA:  58 year old female with a suspicious mass in the left breast at the 4 o'clock position. EXAM: ULTRASOUND GUIDED LEFT BREAST CORE NEEDLE BIOPSY COMPARISON:   Previous exam(s). FINDINGS: I met with the patient and we discussed the procedure of ultrasound-guided biopsy, including benefits and alternatives. We discussed the high likelihood of a successful procedure. We discussed the risks of the procedure, including infection, bleeding, tissue injury, clip migration, and inadequate sampling. Informed written consent was given. The usual time-out protocol was performed immediately prior to the procedure. Lesion quadrant: Lower outer Using sterile technique and 1% Lidocaine as local anesthetic, under direct ultrasound visualization, a 12 gauge spring-loaded device was used to perform biopsy of the mass in the left breast at the 4 o'clock position using a lateral to medial approach. At the conclusion of the procedure a ribbon shaped tissue marker clip was deployed into the biopsy cavity. Follow up 2 view mammogram was performed and dictated separately. IMPRESSION: Ultrasound guided biopsy of the mass in the left breast at the 4 o'clock position. No apparent complications. Electronically Signed: By: Everlean Alstrom M.D. On: 02/12/2019 14:49    ELIGIBLE FOR AVAILABLE RESEARCH PROTOCOL: no  ASSESSMENT: 58 y.o. [place] woman status post left breast biopsy 02/12/2019 for a clinical T1b N0, stage IA invasive ductal carcinoma, estrogen receptor positive, progesterone receptor negative, HER-2 amplified, with an MIB-1-1 of 25%  (1) definitive surgery pending  (2) adjuvant chemotherapy with paclitaxel and trastuzumab  (3) trastuzumab to be continued to complete 1 year  (4) adjuvant radiation  (5) antiestrogens  (6) genetics    PLAN: I spent approximately 60 minutes face to face with Psalm with more than 50% of that time spent in counseling and coordination of care. Specifically we reviewed the biology of the patient's diagnosis and  the specifics of her situation.  We first reviewed the fact that cancer is not one disease but more than 100 different diseases  and that it is important to keep them separate-- otherwise when friends and relatives discuss their own cancer experiences with [place] confusion can result. Similarly we explained that if breast cancer spreads to the bone or liver, the patient would not have bone cancer or liver cancer, but breast cancer in the bone and breast cancer in the liver: one cancer in three places-- not 3 different cancers which otherwise would have to be treated in 3 different ways.  We discussed the difference between local and systemic therapy. In terms of loco-regional treatment, lumpectomy plus radiation is equivalent to mastectomy as far as survival is concerned. For this reason, and because the cosmetic results are generally superior, we recommend breast conserving surgery.   We then discussed the rationale for systemic therapy. There is some risk that this cancer may have already spread to other parts of her body. Patients frequently ask at this point about bone scans, CAT scans and PET scans to find out if they have occult breast cancer somewhere else. The problem is that in early stage disease we are much more likely to find false positives then true cancers and this would expose the patient to unnecessary procedures as well as unnecessary radiation. Scans cannot answer the question the patient really would like to know, which is whether she has microscopic disease elsewhere in her body. For those reasons we do not recommend them.  Of course we would proceed to aggressive evaluation of any symptoms that might suggest metastatic disease, but that is not the case here.  Next we went over the options for systemic therapy which are anti-estrogens, anti-HER-2 immunotherapy, and chemotherapy. Penina is a good candidate for all 3, which is my recommendation.  She understands that because of the algebra of chemo/immunotherapy/antiestrogen therapy if her risk of recurrence after optimal local treatment is 30% it would be down to  5% with these agents.  Assuming no surprises, and specifically no lymph node positivity, I would recommend weekly paclitaxel x12 together with trastuzumab weekly x12, followed by trastuzumab every 21 days to complete 1 year.  We discussed the possible toxicities side effects and complications of these agents including concerns regarding peripheral neuropathy with Taxol and weakening of the heart muscle with trastuzumab.  In addition she meets criteria for genetics. In patients who carry a deleterious mutation [for example in a  BRCA gene], the risk of a new breast cancer developing in the future may be sufficiently great that the patient may choose bilateral mastectomies. However if she wishes to keep her breasts in that situation it is safe to do so. That would require intensified screening, which generally means not only yearly mammography but a yearly breast MRI as well.  The plan then is to start with breast conserving surgery and sentinel lymph node sampling, with port placement, to be followed by chemotherapy, with anti-HER-2 immunotherapy, then radiation after chemotherapy, and antiestrogens.  Makai has a good understanding of the overall plan. She agrees with it. She knows the goal of treatment in her case is cure. She will call with any problems that may develop before her next visit here.   Chauncey Cruel, MD   02/18/2019 2:49 PM Medical Oncology and Hematology [facility] [address] [place], [place] [address] Tel. [phone]    Fax. [phone]   This document serves as a  record of services personally performed by [name], MD. It was created on his behalf by Wilburn Mylar, a trained medical scribe. The creation of this record is based on the scribe's personal observations and the provider's statements to them.   I, [name] MD, have reviewed the above documentation for accuracy and completeness, and I agree with the above.

## 2019-02-18 NOTE — Therapy (Signed)
[place], [place], [address] Phone: [phone]   Fax:  [phone]  Physical Therapy Evaluation  Patient Details  Name: Allison Hanna MRN: [id number] Date of Birth: [birth year] Referring Provider (PT): Dr. Fanny Skates   Encounter Date: 02/18/2019  PT End of Session - 02/18/19 1512    Visit Number  1    Number of Visits  2    Date for PT Re-Evaluation  04/15/19    PT Start Time  1545    PT Stop Time  1612    PT Time Calculation (min)  27 min    Activity Tolerance  Patient tolerated treatment well    Behavior During Therapy  [facility] for tasks assessed/performed       Past Medical History:  Diagnosis Date  . Anxiety   . High cholesterol   . Hypothyroid   . Paralyzed vocal cords   . Raynauds syndrome     Past Surgical History:  Procedure Laterality Date  . KNEE SURGERY Bilateral    arthroscopy for menicus tears  . WRIST SURGERY      There were no vitals filed for this visit.   Subjective Assessment - 02/18/19 1457    Subjective  Patient reports she is here today to be seen by her medical team for her newly diagnosed left breast cancer.    Pertinent History  Patient was diagnosed on 02/11/2019 with left invasive ductal carcinoma breast cancer. It measures 8 mm and is located in the lower outer quadrant. It is ER positive, PR negative, and HER2 positive with a Ki67 of 25%. She is otherwise healthy.    Patient Stated Goals  Reduce lymphedema risk and learn post op shoulder ROM HEP    Currently in Pain?  No/denies         [facility] PT Assessment - 02/18/19 0001      Assessment   Medical Diagnosis  Left breast cancer    Referring Provider (PT)  Dr. Fanny Skates    Onset Date/Surgical Date  02/11/19    Hand Dominance  Right    Prior Therapy  none      Precautions   Precautions  Other (comment)    Precaution Comments  Active cancer      Restrictions   Weight Bearing Restrictions  No       Balance Screen   Has the patient fallen in the past 6 months  No    Has the patient had a decrease in activity level because of a fear of falling?   No    Is the patient reluctant to leave their home because of a fear of falling?   No      Home Environment   Living Environment  Private residence    Living Arrangements  Spouse/significant other    Available Help at Discharge  Family      Prior Function   Level of Independence  Independent    Vocation  Full time employment    Vocation Requirements  VP of Forensic psychologist at [employer]    Leisure  She does not exercise      Cognition   Overall Cognitive Status  Within Functional Limits for tasks assessed      Posture/Postural Control   Posture/Postural Control  Postural limitations    Postural Limitations  Rounded Shoulders;Forward head      ROM / Strength   AROM / PROM / Strength  AROM;Strength  AROM   AROM Assessment Site  Shoulder;Cervical    Right/Left Shoulder  Right;Left    Right Shoulder Extension  50 Degrees    Right Shoulder Flexion  153 Degrees    Right Shoulder ABduction  173 Degrees    Right Shoulder Internal Rotation  57 Degrees    Right Shoulder External Rotation  87 Degrees    Left Shoulder Extension  39 Degrees    Left Shoulder Flexion  154 Degrees    Left Shoulder ABduction  172 Degrees    Left Shoulder Internal Rotation  58 Degrees    Left Shoulder External Rotation  90 Degrees    Cervical Flexion  WNL    Cervical Extension  WNL    Cervical - Right Side Bend  50% limited    Cervical - Left Side Bend  25% limited    Cervical - Right Rotation  25% limited    Cervical - Left Rotation  25% limited      Strength   Overall Strength  Within functional limits for tasks performed        LYMPHEDEMA/ONCOLOGY QUESTIONNAIRE - 02/18/19 1510      Type   Cancer Type  Left breast cancer      Lymphedema Assessments   Lymphedema Assessments  Upper extremities      Right Upper Extremity  Lymphedema   10 cm Proximal to Olecranon Process  26.9 cm    Olecranon Process  24.6 cm    10 cm Proximal to Ulnar Styloid Process  22 cm    Just Proximal to Ulnar Styloid Process  15.6 cm    Across Hand at [employer]  19.3 cm    At [place] of 2nd Digit  6.2 cm      Left Upper Extremity Lymphedema   10 cm Proximal to Olecranon Process  25.7 cm    Olecranon Process  23.9 cm    10 cm Proximal to Ulnar Styloid Process  20.9 cm    Just Proximal to Ulnar Styloid Process  14.6 cm    Across Hand at [employer]  18.3 cm    At [place] of 2nd Digit  5.7 cm             Objective measurements completed on examination: See above findings.       Patient was instructed today in a home exercise program today for post op shoulder range of motion. These included active assist shoulder flexion in sitting, scapular retraction, wall walking with shoulder abduction, and hands behind head external rotation.  She was encouraged to do these twice a day, holding 3 seconds and repeating 5 times when permitted by her physician.           PT Education - 02/18/19 1510    Education Details  Lymphedema risk reduction and post op shoulder ROM HEP    Person(s) Educated  Patient    Methods  Explanation;Demonstration;Handout    Comprehension  Returned demonstration;Verbalized understanding          PT Long Term Goals - 02/18/19 1518      PT LONG TERM GOAL #1   Title  Patient will demonstrate she has regained full shoulder ROM and function post operatively compared to baselines.    Time  8    Period  Weeks    Status  New      Breast Clinic Goals - 02/18/19 1518      Patient will be able to verbalize understanding of  pertinent lymphedema risk reduction practices relevant to her diagnosis specifically related to skin care.   Time  1    Period  Weeks    Status  Achieved      Patient will be able to return demonstrate and/or verbalize understanding of the post-op home exercise program  related to regaining shoulder range of motion.   Time  1    Period  Days    Status  Achieved      Patient will be able to verbalize understanding of the importance of attending the postoperative After Breast Cancer Class for further lymphedema risk reduction education and therapeutic exercise.   Time  1    Period  Days    Status  Achieved            Plan - 02/18/19 1514    Clinical Impression Statement  Patient was diagnosed on 02/11/2019 with left invasive ductal carcinoma breast cancer. It measures 8 mm and is located in the lower outer quadrant. It is ER positive, PR negative, and HER2 positive with a Ki67 of 25%. She is otherwise healthy. Her multidisciplinary medical team met prior to her assessments to determine a recommended treatment plan. She is planning to have a left lumpectomy and sentinel node biopsy followed by chemotherapy, radiation, and anti-estrogen therapy. She will benefit from a post op PT assessment to determine needs.    Stability/Clinical Decision Making  Stable/Uncomplicated    Clinical Decision Making  Low    Rehab Potential  Excellent    PT Frequency  --   Eval and 1 f/u visit   PT Treatment/Interventions  ADLs/Self Care Home Management;Therapeutic exercise;Patient/family education    PT Next Visit Plan  Will reassess 3-4 weeks post op to determine needs    PT Home Exercise Plan  Post op shoulder ROM HEP    Consulted and Agree with Plan of Care  Patient       Patient will benefit from skilled therapeutic intervention in order to improve the following deficits and impairments:  Pain, Impaired UE functional use, Postural dysfunction, Decreased range of motion, Decreased knowledge of precautions  Visit Diagnosis: 1. Malignant neoplasm of lower-outer quadrant of left breast of female, estrogen receptor positive ([place])   2. Abnormal posture      Patient will follow up at outpatient cancer rehab 3-4 weeks following surgery.  If the patient requires physical  therapy at that time, a specific plan will be dictated and sent to the referring physician for approval. The patient was educated today on appropriate basic range of motion exercises to begin post operatively and the importance of attending the After Breast Cancer class following surgery.  Patient was educated today on lymphedema risk reduction practices as it pertains to recommendations that will benefit the patient immediately following surgery.  She verbalized good understanding.     Problem List Patient Active Problem List   Diagnosis Date Noted  . Malignant neoplasm of lower-outer quadrant of left breast of female, estrogen receptor positive ([place]) 02/17/2019   Annia Friendly, PT 02/18/19 4:15 PM  [place], [place], [address] Phone: [phone]   Fax:  [phone]  Name: Allison Hanna MRN: [id number] Date of Birth: 06/28/1961

## 2019-02-18 NOTE — Patient Instructions (Signed)

## 2019-02-19 ENCOUNTER — Ambulatory Visit: Payer: Managed Care, Other (non HMO) | Admitting: Genetic Counselor

## 2019-02-19 ENCOUNTER — Ambulatory Visit (HOSPITAL_BASED_OUTPATIENT_CLINIC_OR_DEPARTMENT_OTHER): Payer: Managed Care, Other (non HMO) | Admitting: Genetic Counselor

## 2019-02-19 DIAGNOSIS — Z808 Family history of malignant neoplasm of other organs or systems: Secondary | ICD-10-CM | POA: Diagnosis not present

## 2019-02-19 DIAGNOSIS — Z1379 Encounter for other screening for genetic and chromosomal anomalies: Secondary | ICD-10-CM

## 2019-02-19 DIAGNOSIS — C50512 Malignant neoplasm of lower-outer quadrant of left female breast: Secondary | ICD-10-CM | POA: Diagnosis not present

## 2019-02-19 DIAGNOSIS — Z170 Estrogen receptor positive status [ER+]: Secondary | ICD-10-CM

## 2019-02-19 DIAGNOSIS — Z803 Family history of malignant neoplasm of breast: Secondary | ICD-10-CM

## 2019-02-20 ENCOUNTER — Encounter: Payer: Self-pay | Admitting: Genetic Counselor

## 2019-02-20 ENCOUNTER — Telehealth: Payer: Self-pay | Admitting: *Deleted

## 2019-02-20 DIAGNOSIS — Z808 Family history of malignant neoplasm of other organs or systems: Secondary | ICD-10-CM | POA: Insufficient documentation

## 2019-02-20 DIAGNOSIS — Z803 Family history of malignant neoplasm of breast: Secondary | ICD-10-CM | POA: Insufficient documentation

## 2019-02-20 NOTE — Progress Notes (Signed)
REFERRING PROVIDER: Chauncey Cruel, MD [address] [place],  [place] [address]  PRIMARY PROVIDER:  Merrilee Seashore, MD  PRIMARY REASON FOR VISIT:  1. Malignant neoplasm of lower-outer quadrant of left breast of female, estrogen receptor positive ([place])   2. Family history of breast cancer   3. Family history of melanoma      HISTORY OF PRESENT ILLNESS:    I connected with Allison Hanna on 02/20/2019 at 1 PM EDT by [facility] video conference and verified that I am speaking with the correct person using two identifiers.   Patient location: Home Provider location: Office  Allison Hanna, a 58 y.o. female, was seen for a [place] cancer genetics consultation at the request of Dr. Jana Hakim due to a personal and family history of breast cancer.  Allison Hanna presents to clinic today to discuss the possibility of a hereditary predisposition to cancer, genetic testing, and to further clarify her future cancer risks, as well as potential cancer risks for family members.   In June 2020, at the age of 74, Allison Hanna was diagnosed with invasive ductal carcinoma of the left breast. The treatment plan lumpectomy, chemotherapy, radiation and herceptin.    CANCER HISTORY:  Oncology History  Malignant neoplasm of lower-outer quadrant of left breast of female, estrogen receptor positive ([place])  02/17/2019 Initial Diagnosis   Malignant neoplasm of lower-outer quadrant of left breast of female, estrogen receptor positive ([place])   03/17/2019 -  Chemotherapy   The patient had PALONOSETRON HCL INJECTION 0.25 MG/5ML, 0.25 mg, Intravenous,  Once, 0 of 3 cycles CARBOplatin (PARAPLATIN) in sodium chloride 0.9 % 100 mL chemo infusion, , Intravenous,  Once, 0 of 3 cycles PACLitaxel (TAXOL) 150 mg in sodium chloride 0.9 % 250 mL chemo infusion (</= 14m/m2), 80 mg/m2, Intravenous,  Once, 0 of 3 cycles  for chemotherapy treatment.       RISK FACTORS:  Menarche was at age 58  First live birth at age  58  OCP use for approximately 0 years.  Ovaries intact: yes.  Hysterectomy: no.  Menopausal status: postmenopausal.  HRT use: 0 years. Colonoscopy: yes; normal. Mammogram within the last year: yes. Number of breast biopsies: 1. Up to date with pelvic exams: yes. Any excessive radiation exposure in the past: no  Past Medical History:  Diagnosis Date  . Anxiety   . Family history of breast cancer   . Family history of melanoma   . High cholesterol   . Hypothyroid   . Paralyzed vocal cords   . Raynauds syndrome     Past Surgical History:  Procedure Laterality Date  . KNEE SURGERY Bilateral    arthroscopy for menicus tears  . WRIST SURGERY      Social History   Socioeconomic History  . Marital status: Married    Spouse name: Not on file  . Number of children: Not on file  . Years of education: Not on file  . Highest education level: Not on file  Occupational History  . Not on file  Social Needs  . Financial resource strain: Not on file  . Food insecurity    Worry: Not on file    Inability: Not on file  . Transportation needs    Medical: Not on file    Non-medical: Not on file  Tobacco Use  . Smoking status: Never Smoker  . Smokeless tobacco: Never Used  Substance and Sexual Activity  . Alcohol use: Yes    Alcohol/week: 2.0 standard drinks  Types: 2 Standard drinks or equivalent per week  . Drug use: Never  . Sexual activity: Not on file  Lifestyle  . Physical activity    Days per week: Not on file    Minutes per session: Not on file  . Stress: Not on file  Relationships  . Social Herbalist on phone: Not on file    Gets together: Not on file    Attends religious service: Not on file    Active member of club or organization: Not on file    Attends meetings of clubs or organizations: Not on file    Relationship status: Not on file  Other Topics Concern  . Not on file  Social History Narrative  . Not on file     FAMILY HISTORY:  We  obtained a detailed, 4-generation family history.  Significant diagnoses are listed below: Family History  Problem Relation Age of Onset  . Breast cancer Mother 48  . Cervical cancer Mother   . Heart attack Father   . Skin cancer Brother   . Breast cancer Paternal Aunt        dx over 79s  . Breast cancer Paternal Aunt        dx over 57  . Uterine cancer Paternal Aunt 26  . Cervical cancer Maternal Aunt   . Liver cancer Paternal Uncle 75  . Stroke Maternal Grandmother   . Parkinson's disease Paternal Grandfather   . Breast cancer Cousin 75       mother's maternal first cousin  . Melanoma Cousin 86       pat first cousin    The patient has three children who are cancer free.  She has a brother who had skin cancer on the top of his head.  Both parents are deceased.  The patient's mother had breast cancer at 31, and cervical cancer at 19.  She had two sisters, one sister also had cervical cancer.  The maternal grandparents died of non cancer related issues. The patient reports that her grandmother's sibling had a daughter with breast cancer around age 49.  The patient's father died of a heart attack at 23.  He had a brother and two sisters.  The brother had liver cancer at 66.  The sisters both had breast cancer, one died of her breast cancer the other is living and was just diagnosed with uterine cancer at 67.  This sister has a daughter with melanoma.  The paternal grandparents are deceased.  Allison Hanna is unaware of previous family history of genetic testing for hereditary cancer risks. Patient's maternal ancestors are of [place], [place] and [place] descent, and paternal ancestors are of [place] and [place] descent. There is no reported Ashkenazi Jewish ancestry. There is no known consanguinity.  GENETIC COUNSELING ASSESSMENT: Ms. Greening is a 58 y.o. female with a personal and family history of breast cancer which is somewhat suggestive of a hereditary cancer syndrome and predisposition to  cancer. We, therefore, discussed and recommended the following at today's visit.   DISCUSSION: We discussed that 5 - 10% of breast cancer is hereditary, with most cases associated with BRCA mutations.  There are other genes that can be associated with hereditary breast cancer syndromes.  These include ATM, CHEK2 and PALB2.  We discussed that melanoma can sometimes be seen with hereditary breast cancer syndromes, specifically in BRCA2 or PTEN.  However, there are about 5-10% of melanomas that are hereditary and due to other genes, such  as CDKN2A, MITF or POT1.  We discussed that testing is beneficial for several reasons including knowing how to follow individuals after completing their treatment, identifying whether potential treatment options such as PARP inhibitors would be beneficial, and understand if other family members could be at risk for cancer and allow them to undergo genetic testing.   We reviewed the characteristics, features and inheritance patterns of hereditary cancer syndromes. We also discussed genetic testing, including the appropriate family members to test, the process of testing, insurance coverage and turn-around-time for results. We discussed the implications of a negative, positive and/or variant of uncertain significant result. In order to get genetic test results in a timely manner so that Ms. Limpert can use these genetic test results for surgical decisions, we recommended Ms. Hoar pursue genetic testing for the STAT panel. Once complete, we recommend Ms. Mcshea pursue reflex genetic testing to the Multi cancer gene panel. The Common Hereditary Gene Panel offered by [facility] includes sequencing and/or deletion duplication testing of the following 48 genes: APC, ATM, AXIN2, BARD1, BMPR1A, BRCA1, BRCA2, BRIP1, CDH1, CDK4, CDKN2A (p14ARF), CDKN2A (p16INK4a), CHEK2, CTNNA1, DICER1, EPCAM (Deletion/duplication testing only), GREM1 (promoter region deletion/duplication testing only), KIT,  MEN1, MLH1, MSH2, MSH3, MSH6, MUTYH, NBN, NF1, NHTL1, PALB2, PDGFRA, PMS2, POLD1, POLE, PTEN, RAD50, RAD51C, RAD51D, RNF43, SDHB, SDHC, SDHD, SMAD4, SMARCA4. STK11, TP53, TSC1, TSC2, and VHL.  The following genes were evaluated for sequence changes only: SDHA and HOXB13 c.251G>A variant only.   Based on Ms. Lantry's personal and family history of cancer, she meets medical criteria for genetic testing. Despite that she meets criteria, she may still have an out of pocket cost.   PLAN: After considering the risks, benefits, and limitations, Ms. Honeycutt provided informed consent to pursue genetic testing and the blood sample was sent to [facility] for analysis of the STAT and Multi-cancer gene panel. Results should be available within approximately 5-10 days for the stat panel and up to an additional  2 weeks' time, at which point they will be disclosed by telephone to Ms. Scritchfield, as will any additional recommendations warranted by these results. Ms. Nygard will receive a summary of her genetic counseling visit and a copy of her results once available. This information will also be available in Epic.   Lastly, we encouraged Ms. Andreoni to remain in contact with cancer genetics annually so that we can continuously update the family history and inform her of any changes in cancer genetics and testing that may be of benefit for this family.   Ms. Vittitow questions were answered to her satisfaction today. Our contact information was provided should additional questions or concerns arise. Thank you for the referral and allowing [place] to share in the care of your patient.   Britzy Graul P. Florene Glen, [place], [facility] Certified Genetic Counselor [email or web address] phone: [phone]  The patient was seen for a total of 45 minutes in face-to-face genetic counseling.  This patient was discussed with Drs. Magrinat, Lindi Adie and/or Burr Medico who agrees with the above.     _______________________________________________________________________ For Office Staff:  Number of people involved in session: 1 Was an Intern/ student involved with case: no

## 2019-02-20 NOTE — Telephone Encounter (Signed)
  Oncology Nurse Navigator Documentation  Navigator Location: [facility] (02/20/19 1900)   )Navigator Encounter Type: [place] Follow-up;Telephone (02/20/19 1900) Telephone: [facility] Follow-up;Incoming Call (02/20/19 1900)       Genetic Counseling Date: 02/19/19 (02/20/19 1900) Genetic Counseling Type: Urgent (02/20/19 1900)                                        Time Spent with Patient: 15 (02/20/19 1900)

## 2019-02-23 ENCOUNTER — Other Ambulatory Visit: Payer: Self-pay

## 2019-02-23 ENCOUNTER — Other Ambulatory Visit: Payer: Self-pay | Admitting: General Surgery

## 2019-02-23 ENCOUNTER — Encounter (HOSPITAL_BASED_OUTPATIENT_CLINIC_OR_DEPARTMENT_OTHER): Payer: Self-pay

## 2019-02-23 ENCOUNTER — Encounter: Payer: Self-pay | Admitting: General Practice

## 2019-02-23 DIAGNOSIS — C50512 Malignant neoplasm of lower-outer quadrant of left female breast: Secondary | ICD-10-CM

## 2019-02-23 NOTE — Progress Notes (Signed)
[place] Psychosocial Distress Screening Spiritual Care  Met with Allison Hanna by phone following Breast Multidisciplinary Clinic to introduce [facility] team/resources, reviewing distress screen per protocol.  The patient scored a 8 on the Psychosocial Distress Thermometer which indicates severe distress. Also assessed for distress and other psychosocial needs.   ONCBCN DISTRESS SCREENING 02/23/2019  Screening Type Initial Screening  Distress experienced in past week (1-10) 8  Emotional problem type Depression;Nervousness/Anxiety;Adjusting to illness;Feeling hopeless  Spiritual/Religous concerns type Facing my mortality  Information Concerns Type Lack of info about diagnosis;Lack of info about treatment;Lack of info about complementary therapy choices;Lack of info about maintaining fitness  Physical Problem type Sleep/insomnia;Constipation/diarrhea;Changes in urination  Referral to support programs Yes   Allison Hanna states that she is overall feeling better after [facility] (distress now at 4), though understandably she has some anxiety about treatment and recurrence. Per pt, her husband is very supportive. Her biggest current stress is preparing work for her absence. We talked in detail about mindfulness and coping with stress by returning to the present moment, where our living actually happens.    Follow up needed: No.  Per pt, no other needs now. She is aware of ongoing [facility] team and programming resources and plans to reach out to chaplain as needed/desired.  [place], [place], [facility] Pager [phone] Voicemail [phone]

## 2019-02-24 ENCOUNTER — Telehealth: Payer: Self-pay | Admitting: *Deleted

## 2019-02-24 NOTE — Telephone Encounter (Signed)
Left detailed vm with instructions for wig as well as contact information for [facility].

## 2019-02-25 HISTORY — PX: BREAST LUMPECTOMY: SHX2

## 2019-02-25 NOTE — Progress Notes (Signed)
Ensure pre surgery drink given with instructions to complete by 0800 dos, pt verbalized understanding.

## 2019-02-26 ENCOUNTER — Ambulatory Visit (HOSPITAL_COMMUNITY)
Admission: RE | Admit: 2019-02-26 | Discharge: 2019-02-26 | Disposition: A | Discharge status: Home / Self Care | Payer: Managed Care, Other (non HMO) | Source: Ambulatory Visit | Attending: Oncology | Admitting: Oncology

## 2019-02-26 ENCOUNTER — Telehealth: Payer: Self-pay | Admitting: Genetic Counselor

## 2019-02-26 ENCOUNTER — Other Ambulatory Visit (HOSPITAL_COMMUNITY): Payer: Managed Care, Other (non HMO)

## 2019-02-26 ENCOUNTER — Ambulatory Visit: Payer: Self-pay | Admitting: Genetic Counselor

## 2019-02-26 ENCOUNTER — Other Ambulatory Visit: Payer: Self-pay

## 2019-02-26 ENCOUNTER — Encounter: Payer: Self-pay | Admitting: Genetic Counselor

## 2019-02-26 DIAGNOSIS — E785 Hyperlipidemia, unspecified: Secondary | ICD-10-CM | POA: Diagnosis not present

## 2019-02-26 DIAGNOSIS — Z170 Estrogen receptor positive status [ER+]: Secondary | ICD-10-CM | POA: Insufficient documentation

## 2019-02-26 DIAGNOSIS — C50512 Malignant neoplasm of lower-outer quadrant of left female breast: Secondary | ICD-10-CM | POA: Diagnosis not present

## 2019-02-26 DIAGNOSIS — I7300 Raynaud's syndrome without gangrene: Secondary | ICD-10-CM | POA: Insufficient documentation

## 2019-02-26 DIAGNOSIS — Z1379 Encounter for other screening for genetic and chromosomal anomalies: Secondary | ICD-10-CM | POA: Insufficient documentation

## 2019-02-26 NOTE — Progress Notes (Signed)
  Echocardiogram 2D Echocardiogram has been performed.  Darlina Sicilian M 02/26/2019, 10:54 AM

## 2019-02-26 NOTE — Telephone Encounter (Signed)
Revealed negative genetic testing.  Discussed that we do not know why she has breast cancer or why there is cancer in the family. It could be due to a different gene that we are not testing, or maybe our current technology may not be able to pick something up.  It will be important for her to keep in contact with genetics to keep up with whether additional testing may be needed. 

## 2019-02-26 NOTE — Progress Notes (Signed)
HPI:  Ms. Rhines was previously seen in the [place] clinic due to a personal and family history of cancer and concerns regarding a hereditary predisposition to cancer. Please refer to our prior cancer genetics clinic note for more information regarding our discussion, assessment and recommendations, at the time. Ms. Loring recent genetic test results were disclosed to her, as were recommendations warranted by these results. These results and recommendations are discussed in more detail below.  CANCER HISTORY:  Oncology History  Malignant neoplasm of lower-outer quadrant of left breast of female, estrogen receptor positive ([place])  02/17/2019 Initial Diagnosis   Malignant neoplasm of lower-outer quadrant of left breast of female, estrogen receptor positive ([place])   02/25/2019 Genetic Testing   Negative genetic testing on the multicancer panel.  The Multi-Gene Panel offered by [facility] includes sequencing and/or deletion duplication testing of the following 85 genes: AIP, ALK, APC, ATM, AXIN2,BAP1,  BARD1, BLM, BMPR1A, BRCA1, BRCA2, BRIP1, CASR, CDC73, CDH1, CDK4, CDKN1B, CDKN1C, CDKN2A (p14ARF), CDKN2A (p16INK4a), CEBPA, CHEK2, CTNNA1, DICER1, DIS3L2, EGFR (c.2369C>T, p.Thr790Met variant only), EPCAM (Deletion/duplication testing only), FH, FLCN, GATA2, GPC3, GREM1 (Promoter region deletion/duplication testing only), HOXB13 (c.251G>A, p.Gly84Glu), HRAS, KIT, MAX, MEN1, MET, MITF (c.952G>A, p.Glu318Lys variant only), MLH1, MSH2, MSH3, MSH6, MUTYH, NBN, NF1, NF2, NTHL1, PALB2, PDGFRA, PHOX2B, PMS2, POLD1, POLE, POT1, PRKAR1A, PTCH1, PTEN, RAD50, RAD51C, RAD51D, RB1, RECQL4, RET, RNF43, RUNX1, SDHAF2, SDHA (sequence changes only), SDHB, SDHC, SDHD, SMAD4, SMARCA4, SMARCB1, SMARCE1, STK11, SUFU, TERC, TERT, TMEM127, TP53, TSC1, TSC2, VHL, WRN and WT1.  The report date is February 25, 2019.   03/17/2019 -  Chemotherapy   The patient had PALONOSETRON HCL INJECTION 0.25 MG/5ML, 0.25 mg, Intravenous,   Once, 0 of 3 cycles CARBOplatin (PARAPLATIN) in sodium chloride 0.9 % 100 mL chemo infusion, , Intravenous,  Once, 0 of 3 cycles PACLitaxel (TAXOL) 150 mg in sodium chloride 0.9 % 250 mL chemo infusion (</= 66m/m2), 80 mg/m2, Intravenous,  Once, 0 of 3 cycles  for chemotherapy treatment.      FAMILY HISTORY:  We obtained a detailed, 4-generation family history.  Significant diagnoses are listed below: Family History  Problem Relation Age of Onset  . Breast cancer Mother 522 . Cervical cancer Mother   . Heart attack Father   . Skin cancer Brother   . Breast cancer Paternal Aunt        dx over 5101s . Breast cancer Paternal Aunt        dx over 523 . Uterine cancer Paternal Aunt 834 . Cervical cancer Maternal Aunt   . Liver cancer Paternal Uncle 948 . Stroke Maternal Grandmother   . Parkinson's disease Paternal Grandfather   . Breast cancer Cousin 479      mother's maternal first cousin  . Melanoma Cousin 424      pat first cousin    The patient has three children who are cancer free.  She has a brother who had skin cancer on the top of his head.  Both parents are deceased.  The patient's mother had breast cancer at 569 and cervical cancer at 69  She had two sisters, one sister also had cervical cancer.  The maternal grandparents died of non cancer related issues. The patient reports that her grandmother's sibling had a daughter with breast cancer around age 60  The patient's father died of a heart attack at 566  He had a brother and two sisters.  The brother had liver cancer  at 64.  The sisters both had breast cancer, one died of her breast cancer the other is living and was just diagnosed with uterine cancer at 65.  This sister has a daughter with melanoma.  The paternal grandparents are deceased.  Ms. Gangwer is unaware of previous family history of genetic testing for hereditary cancer risks. Patient's maternal ancestors are of [place], [place] and [place] descent, and paternal  ancestors are of [place] and [place] descent. There is no reported Ashkenazi Jewish ancestry. There is no known consanguinity.    GENETIC TEST RESULTS: Genetic testing reported out on February 25, 2019 through the multi-cancer panel found no pathogenic mutations. The Multi-Gene Panel offered by [facility] includes sequencing and/or deletion duplication testing of the following 85 genes: AIP, ALK, APC, ATM, AXIN2,BAP1,  BARD1, BLM, BMPR1A, BRCA1, BRCA2, BRIP1, CASR, CDC73, CDH1, CDK4, CDKN1B, CDKN1C, CDKN2A (p14ARF), CDKN2A (p16INK4a), CEBPA, CHEK2, CTNNA1, DICER1, DIS3L2, EGFR (c.2369C>T, p.Thr790Met variant only), EPCAM (Deletion/duplication testing only), FH, FLCN, GATA2, GPC3, GREM1 (Promoter region deletion/duplication testing only), HOXB13 (c.251G>A, p.Gly84Glu), HRAS, KIT, MAX, MEN1, MET, MITF (c.952G>A, p.Glu318Lys variant only), MLH1, MSH2, MSH3, MSH6, MUTYH, NBN, NF1, NF2, NTHL1, PALB2, PDGFRA, PHOX2B, PMS2, POLD1, POLE, POT1, PRKAR1A, PTCH1, PTEN, RAD50, RAD51C, RAD51D, RB1, RECQL4, RET, RNF43, RUNX1, SDHAF2, SDHA (sequence changes only), SDHB, SDHC, SDHD, SMAD4, SMARCA4, SMARCB1, SMARCE1, STK11, SUFU, TERC, TERT, TMEM127, TP53, TSC1, TSC2, VHL, WRN and WT1.  The test report has been scanned into EPIC and is located under the Molecular Pathology section of the Results Review tab.  A portion of the result report is included below for reference.     We discussed with Ms. Hoots that because current genetic testing is not perfect, it is possible there may be a gene mutation in one of these genes that current testing cannot detect, but that chance is small.  We also discussed, that there could be another gene that has not yet been discovered, or that we have not yet tested, that is responsible for the cancer diagnoses in the family. It is also possible there is a hereditary cause for the cancer in the family that Ms. Patient did not inherit and therefore was not identified in her testing.  Therefore, it is  important to remain in touch with cancer genetics in the future so that we can continue to offer Ms. Cosey the most up to date genetic testing.   ADDITIONAL GENETIC TESTING: We discussed with Ms. Gruner that her genetic testing was fairly extensive.  If there are genes identified to increase cancer risk that can be analyzed in the future, we would be happy to discuss and coordinate this testing at that time.    CANCER SCREENING RECOMMENDATIONS: Ms. Sanderford test result is considered negative (normal).  This means that we have not identified a hereditary cause for her personal and family history of cancer at this time. Most cancers happen by chance and this negative test suggests that her cancer may fall into this category.    While reassuring, this does not definitively rule out a hereditary predisposition to cancer. It is still possible that there could be genetic mutations that are undetectable by current technology. There could be genetic mutations in genes that have not been tested or identified to increase cancer risk.  Therefore, it is recommended she continue to follow the cancer management and screening guidelines provided by her oncology and primary healthcare provider.   An individual's cancer risk and medical management are not determined by genetic test results alone. Overall  cancer risk assessment incorporates additional factors, including personal medical history, family history, and any available genetic information that may result in a personalized plan for cancer prevention and surveillance  RECOMMENDATIONS FOR FAMILY MEMBERS:  Individuals in this family might be at some increased risk of developing cancer, over the general population risk, simply due to the family history of cancer.  We recommended women in this family have a yearly mammogram beginning at age 81, or 63 years younger than the earliest onset of cancer, an annual clinical breast exam, and perform monthly breast  self-exams. Women in this family should also have a gynecological exam as recommended by their primary provider. All family members should have a colonoscopy by age 53.  FOLLOW-UP: Lastly, we discussed with Ms. Oelkers that cancer genetics is a rapidly advancing field and it is possible that new genetic tests will be appropriate for her and/or her family members in the future. We encouraged her to remain in contact with cancer genetics on an annual basis so we can update her personal and family histories and let her know of advances in cancer genetics that may benefit this family.   Our contact number was provided. Ms. Mcquown questions were answered to her satisfaction, and she knows she is welcome to call us at anytime with additional questions or concerns.   Roma Kayser, MS, [facility] Certified Genetic Counselor [email or web address]

## 2019-03-01 NOTE — H&P (Signed)
Allison Hanna Location: [facility] Patient #: [id number] DOB: 1961/03/12 Undefined / Language: Cleophus Molt / Race: White Female       History of Present Illness The patient is a 58 year old female who presents with breast cancer. This is a pleasant 58 year old female from [place]. Dr. Gayla Doss is her PCP. Dr. Ronita Hipps is her gynecologist. She was referred to the [facility] today by Dr. Ike Bene at the the [facility] for evaluation of left breast cancer. She is seen in the [facility] today Dr. Jana Hakim, Dr. Sondra Come, and me. Iris Pert, RN served as my Producer, television/film/video. Her husband was present throughout the encounter by phone conference.      Last mammogram was October 2018. She has no prior breast problems. She noticed a little soreness in her left breast laterally for a few months. She noticed a small lump in her left breast on June 1, 3 weeks ago. Mammogram and ultrasound were performed showing category B density. 8 mm mass in the left breast at the 4 o'clock position, 5 cm from the nipple. Spiculated and palpable. Looks fairly superficial on imaging. 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 No grade was given. ER 100%. PR 0. HER-2 positive. Cast 6725% Past history reveals she is healthy. Hypothyroidism. Family history reveals mother was diagnosed with breast cancer at age 23. She developed a recurrence at age 65. Also a paternal aunt had breast cancer in her 57s. Social history reveals she is married. 2 daughters and 1 son. 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 Denies tobacco.      We had a long discussion about management of breast cancer in general and especially surgical management. Dr. Jana Hakim has advised adjuvant chemotherapy with Taxol and Herceptin and she is agreeable to that.  She is interested in genetics and has had blood drawn today. She does not think that results of her genetic testing  would influence her decisions regarding extent of surgery, however.. We spent a long time discussing the options for surgery including lumpectomy, sentinel lymph node biopsy, Port-A-Cath, adjuvant chemotherapy and radiation therapy. We compared that to mastectomy and sentinel lymph node biopsy with or without reconstruction. She is in favor of breast conservation surgery and think she is a good candidate for that. She knows that she can always change her mind if she desires more risk reducing surgery in the future, in the setting of a deleterious mutation. Right now she just wants to go ahead and schedule surgery as soon as possible      She'll be scheduled for Port-A-Cath insertion with ultrasound, inject blue dye left breast, left breast lumpectomy with radioactive seed localization, left axillary sentinel lymph node biopsy. I discussed the indications, details, techniques, numerous risk of the surgery with her and her husband. She is aware of the risk of bleeding, infection, cosmetic deformity, arm swelling, arm numbness, reoperation for positive margins or multiple positive nodes. She is aware of the risk of pneumothorax, cardiac arrhythmia, bilateral testis or port, failed attempts and other unforeseen problems. She understands all these issues. All her questions are answered. She agrees with this plan.  Her main goal is to get on the operating room schedule as soon as possible    Addendum Note: Genetic testing negative   Past Surgical History  Breast Biopsy  Left. Knee Surgery  Bilateral. Oral Surgery   Diagnostic Studies History  Colonoscopy  5-10 years ago Mammogram  within last year Pap  Smear  1-5 years ago  Medication History  Medications Reconciled  Social History  Caffeine use  Coffee. No drug use  Tobacco use  Never smoker.  Family History  Arthritis  Mother. Breast Cancer  Family Members In General, Mother. Cancer  Family Members In General,  Mother. Cerebrovascular Accident  Family Members In General. Cervical Cancer  Mother. Depression  Mother. Heart Disease  Father, Mother. Hypertension  Father, Mother. Ischemic Bowel Disease  Family Members In General, Mother. Melanoma  Brother. Migraine Headache  Son. Seizure disorder  Family Members In General.  Pregnancy / Birth History Age at menarche  80 years. Age of menopause  43-50 Gravida  4 Irregular periods  Length (months) of breastfeeding  >26 Maternal age  86-35 Para  3  Other Problems  Hemorrhoids  Hypercholesterolemia  Other disease, cancer, significant illness  Thyroid Disease     Review of Systems  General Not Present- Appetite Loss, Chills, Fatigue, Fever, Night Sweats, Weight Gain and Weight Loss. Skin Not Present- Change in Wart/Mole, Dryness, Hives, Jaundice, New Lesions, Non-Healing Wounds, Rash and Ulcer. HEENT Not Present- Earache, Hearing Loss, Hoarseness, Nose Bleed, Oral Ulcers, Ringing in the Ears, Seasonal Allergies, Sinus Pain, Sore Throat, Visual Disturbances, Wears glasses/contact lenses and Yellow Eyes. Respiratory Not Present- Bloody sputum, Chronic Cough, Difficulty Breathing, Snoring and Wheezing. Breast Not Present- Breast Mass, Breast Pain, Nipple Discharge and Skin Changes. Cardiovascular Not Present- Chest Pain, Difficulty Breathing Lying Down, Leg Cramps, Palpitations, Rapid Heart Rate, Shortness of Breath and Swelling of Extremities. Gastrointestinal Present- Change in Bowel Habits. Not Present- Abdominal Pain, Bloating, Bloody Stool, Chronic diarrhea, Constipation, Difficulty Swallowing, Excessive gas, Gets full quickly at meals, Hemorrhoids, Indigestion, Nausea, Rectal Pain and Vomiting. Female Genitourinary Present- Frequency. Not Present- Nocturia, Painful Urination, Pelvic Pain and Urgency. Musculoskeletal Not Present- Back Pain, Joint Pain, Joint Stiffness, Muscle Pain, Muscle Weakness and Swelling of  Extremities. Neurological Not Present- Decreased Memory, Fainting, Headaches, Numbness, Seizures, Tingling, Tremor, Trouble walking and Weakness. Psychiatric Present- Anxiety. Not Present- Bipolar, Change in Sleep Pattern, Depression, Fearful and Frequent crying. Endocrine Not Present- Cold Intolerance, Excessive Hunger, Hair Changes, Heat Intolerance, Hot flashes and New Diabetes. Hematology Not Present- Blood Thinners, Easy Bruising, Excessive bleeding, Gland problems, HIV and Persistent Infections.   Physical Exam General Mental Status-Alert. General Appearance-Consistent with stated age. Hydration-Well hydrated. Voice-Normal.  Head and Neck Head-normocephalic, atraumatic with no lesions or palpable masses. Trachea-midline. Thyroid Gland Characteristics - normal size and consistency.  Eye Eyeball - Bilateral-Extraocular movements intact. Sclera/Conjunctiva - Bilateral-No scleral icterus.  Chest and Lung Exam Chest and lung exam reveals -quiet, even and easy respiratory effort with no use of accessory muscles and on auscultation, normal breath sounds, no adventitious sounds and normal vocal resonance. Inspection Chest Wall - Normal. Back - normal.  Breast Note: Breasts are symmetrical. Medium size. Bra size 30 6B to C according to her. In the left breast at the 4 o'clock position I can feel a small palpable density. There is no overlying skin change. There is no other mass or skin changes in either breast. There is no axillary adenopathy on either side.   Cardiovascular Cardiovascular examination reveals -normal heart sounds, regular rate and rhythm with no murmurs and normal pedal pulses bilaterally.  Abdomen Inspection Inspection of the abdomen reveals - No Hernias. Skin - Scar - no surgical scars. Palpation/Percussion Palpation and Percussion of the abdomen reveal - Soft, Non Tender, No Rebound tenderness, No Rigidity (guarding) and No  hepatosplenomegaly. Auscultation Auscultation of the  abdomen reveals - Bowel sounds normal.  Neurologic Neurologic evaluation reveals -alert and oriented x 3 with no impairment of recent or remote memory. Mental Status-Normal.  Musculoskeletal Normal Exam - Left-Upper Extremity Strength Normal and Lower Extremity Strength Normal. Normal Exam - Right-Upper Extremity Strength Normal and Lower Extremity Strength Normal.  Lymphatic Head & Neck  General Head & Neck Lymphatics: Bilateral - Description - Normal. Axillary  General Axillary Region: Bilateral - Description - Normal. Tenderness - Non Tender. Femoral & Inguinal  Generalized Femoral & Inguinal Lymphatics: Bilateral - Description - Normal. Tenderness - Non Tender.    Assessment & Plan   PRIMARY CANCER OF LOWER OUTER QUADRANT OF LEFT FEMALE BREAST (C50.512)   You recently felt a lump in your left breast laterally. Recent imaging studies and biopsy show an 8 mm cancer in the lower outer quadrant of the left breast The biopsy shows invasive ductal carcinoma. Estrogen receptor 100%, progesterone receptor negative. HER-2 positive. Proliferation index 25%. Ultrasound of the left axilla shows no abnormal lymph nodes  Because this tumor is HER-2 positive, you will benefit from chemotherapy, as described by Dr. Jana Hakim Because your mother and paternal aunt had breast cancer, you have had blood drawn for genetic testing We have discussed how the information from genetic testing may be used both in your personal risk assessment and in siblings and offspring  We've discussed surgical options which include lumpectomy and sentinel lymph node biopsy and we compared that to mastectomy with or without reconstruction. you will also need to have a Port-A-Cath inserted you state that your preference is breast conservation surgery and I think you are a good candidate for that  you will be scheduled for insertion of Port-A-Cath  with ultrasound, inject blue dye left breast, left breast lumpectomy with radioactive seed localization, left axillary sentinel lymph node biopsy I discussed the indications, techniques, and risks of the surgery with you and your husband    HYPOTHYROIDISM, ADULT (E03.9)  FAMILY HISTORY OF BREAST CANCER IN MOTHER (Z80.3)     Edsel Petrin. Dalbert Batman, M.D., [facility] General and Minimally invasive Surgery Breast and Colorectal Surgery Office:   [phone] Pager:   [phone]

## 2019-03-02 ENCOUNTER — Ambulatory Visit (HOSPITAL_COMMUNITY): Payer: Managed Care, Other (non HMO)

## 2019-03-02 ENCOUNTER — Other Ambulatory Visit (HOSPITAL_COMMUNITY)
Admission: RE | Admit: 2019-03-02 | Discharge: 2019-03-02 | Disposition: A | Discharge status: Home / Self Care | Payer: Managed Care, Other (non HMO) | Source: Ambulatory Visit | Attending: General Surgery | Admitting: General Surgery

## 2019-03-02 DIAGNOSIS — Z1159 Encounter for screening for other viral diseases: Secondary | ICD-10-CM | POA: Insufficient documentation

## 2019-03-03 LAB — SARS CORONAVIRUS 2 (TAT 6-24 HRS): SARS Coronavirus 2: NEGATIVE

## 2019-03-04 ENCOUNTER — Other Ambulatory Visit: Payer: Self-pay | Admitting: Oncology

## 2019-03-04 ENCOUNTER — Other Ambulatory Visit: Payer: Self-pay

## 2019-03-04 ENCOUNTER — Telehealth: Payer: Self-pay

## 2019-03-04 ENCOUNTER — Ambulatory Visit
Admission: RE | Admit: 2019-03-04 | Discharge: 2019-03-04 | Disposition: A | Discharge status: Home / Self Care | Payer: Managed Care, Other (non HMO) | Source: Ambulatory Visit | Attending: General Surgery | Admitting: General Surgery

## 2019-03-04 DIAGNOSIS — Z170 Estrogen receptor positive status [ER+]: Secondary | ICD-10-CM

## 2019-03-04 DIAGNOSIS — C50512 Malignant neoplasm of lower-outer quadrant of left female breast: Secondary | ICD-10-CM

## 2019-03-04 NOTE — Telephone Encounter (Signed)
Nutrition  Patient with new diagnosis of breast cancer.  Patient attended [facility] on 6/24 and received nutrition packet of information.   RD working remotely.  Chart reviewed  Called patient to introduce self and service at [facility].  Patient with questions about nutrition.  Questions answered.  Encouraged patient to reach out with further questions or concerns.      Symphani Eckstrom B. Zenia Resides, [place], [place] Registered Dietitian [phone] (pager)

## 2019-03-05 ENCOUNTER — Ambulatory Visit (HOSPITAL_BASED_OUTPATIENT_CLINIC_OR_DEPARTMENT_OTHER): Payer: Managed Care, Other (non HMO) | Admitting: Anesthesiology

## 2019-03-05 ENCOUNTER — Ambulatory Visit (HOSPITAL_COMMUNITY): Payer: Managed Care, Other (non HMO)

## 2019-03-05 ENCOUNTER — Ambulatory Visit (HOSPITAL_BASED_OUTPATIENT_CLINIC_OR_DEPARTMENT_OTHER)
Admission: RE | Admit: 2019-03-05 | Discharge: 2019-03-05 | Disposition: A | Discharge status: Home / Self Care | Payer: Managed Care, Other (non HMO) | Source: Ambulatory Visit | Attending: General Surgery | Admitting: General Surgery

## 2019-03-05 ENCOUNTER — Encounter (HOSPITAL_BASED_OUTPATIENT_CLINIC_OR_DEPARTMENT_OTHER): Payer: Self-pay | Admitting: *Deleted

## 2019-03-05 ENCOUNTER — Encounter (HOSPITAL_COMMUNITY)
Admission: RE | Admit: 2019-03-05 | Discharge: 2019-03-05 | Disposition: A | Discharge status: Home / Self Care | Payer: Managed Care, Other (non HMO) | Source: Ambulatory Visit | Attending: General Surgery | Admitting: General Surgery

## 2019-03-05 ENCOUNTER — Other Ambulatory Visit: Payer: Self-pay

## 2019-03-05 ENCOUNTER — Ambulatory Visit
Admission: RE | Admit: 2019-03-05 | Discharge: 2019-03-05 | Disposition: A | Discharge status: Home / Self Care | Payer: Managed Care, Other (non HMO) | Source: Ambulatory Visit | Attending: General Surgery | Admitting: General Surgery

## 2019-03-05 ENCOUNTER — Encounter (HOSPITAL_BASED_OUTPATIENT_CLINIC_OR_DEPARTMENT_OTHER)
Admission: RE | Disposition: A | Discharge status: Home / Self Care | Payer: Self-pay | Source: Ambulatory Visit | Attending: General Surgery

## 2019-03-05 DIAGNOSIS — C50512 Malignant neoplasm of lower-outer quadrant of left female breast: Secondary | ICD-10-CM

## 2019-03-05 DIAGNOSIS — Z79890 Hormone replacement therapy (postmenopausal): Secondary | ICD-10-CM | POA: Diagnosis not present

## 2019-03-05 DIAGNOSIS — E039 Hypothyroidism, unspecified: Secondary | ICD-10-CM | POA: Insufficient documentation

## 2019-03-05 DIAGNOSIS — Z170 Estrogen receptor positive status [ER+]: Secondary | ICD-10-CM | POA: Insufficient documentation

## 2019-03-05 DIAGNOSIS — Z95828 Presence of other vascular implants and grafts: Secondary | ICD-10-CM

## 2019-03-05 DIAGNOSIS — Z8049 Family history of malignant neoplasm of other genital organs: Secondary | ICD-10-CM | POA: Insufficient documentation

## 2019-03-05 DIAGNOSIS — Z803 Family history of malignant neoplasm of breast: Secondary | ICD-10-CM | POA: Diagnosis not present

## 2019-03-05 HISTORY — PX: BREAST LUMPECTOMY WITH RADIOACTIVE SEED AND SENTINEL LYMPH NODE BIOPSY: SHX6550

## 2019-03-05 HISTORY — PX: PORTACATH PLACEMENT: SHX2246

## 2019-03-05 SURGERY — INSERTION, TUNNELED CENTRAL VENOUS DEVICE, WITH PORT
Anesthesia: General | Site: Chest | Laterality: Right

## 2019-03-05 MED ORDER — FENTANYL CITRATE (PF) 100 MCG/2ML IJ SOLN
INTRAMUSCULAR | Status: AC
  Filled 2019-03-05: qty 2

## 2019-03-05 MED ORDER — SODIUM CHLORIDE 0.9% FLUSH: 3.0000 mL | INTRAVENOUS | Status: DC | PRN

## 2019-03-05 MED ORDER — BUPIVACAINE HCL (PF) 0.5 % IJ SOLN
INTRAMUSCULAR | Status: DC | PRN
  Administered 2019-03-05: 15 mL

## 2019-03-05 MED ORDER — LIDOCAINE 2% (20 MG/ML) 5 ML SYRINGE
INTRAMUSCULAR | Status: DC | PRN
  Administered 2019-03-05: 80 mg via INTRAVENOUS

## 2019-03-05 MED ORDER — ACETAMINOPHEN 500 MG PO TABS
ORAL_TABLET | ORAL | Status: AC
  Filled 2019-03-05: qty 2

## 2019-03-05 MED ORDER — OXYCODONE HCL 5 MG PO TABS
ORAL_TABLET | ORAL | Status: AC
  Filled 2019-03-05: qty 1

## 2019-03-05 MED ORDER — HYDROCODONE-ACETAMINOPHEN 5-325 MG PO TABS: 1.0000 | ORAL_TABLET | Freq: Four times a day (QID) | ORAL | 0 refills | Status: DC | PRN

## 2019-03-05 MED ORDER — EPHEDRINE 5 MG/ML INJ
INTRAVENOUS | Status: AC
  Filled 2019-03-05: qty 10

## 2019-03-05 MED ORDER — PROMETHAZINE HCL 25 MG/ML IJ SOLN: 6.2500 mg | INTRAMUSCULAR | Status: DC | PRN

## 2019-03-05 MED ORDER — TECHNETIUM TC 99M SULFUR COLLOID FILTERED
1.0000 | Freq: Once | INTRAVENOUS | Status: AC | PRN
  Administered 2019-03-05: 1 via INTRADERMAL

## 2019-03-05 MED ORDER — ACETAMINOPHEN 325 MG PO TABS: 650.0000 mg | ORAL_TABLET | ORAL | Status: DC | PRN

## 2019-03-05 MED ORDER — ONDANSETRON HCL 4 MG/2ML IJ SOLN
INTRAMUSCULAR | Status: DC | PRN
  Administered 2019-03-05: 4 mg via INTRAVENOUS

## 2019-03-05 MED ORDER — BUPIVACAINE LIPOSOME 1.3 % IJ SUSP
INTRAMUSCULAR | Status: DC | PRN
  Administered 2019-03-05: 10 mL

## 2019-03-05 MED ORDER — CELECOXIB 200 MG PO CAPS
ORAL_CAPSULE | ORAL | Status: AC
  Filled 2019-03-05: qty 1

## 2019-03-05 MED ORDER — CHLORHEXIDINE GLUCONATE CLOTH 2 % EX PADS: 6.0000 | MEDICATED_PAD | Freq: Once | CUTANEOUS | Status: DC

## 2019-03-05 MED ORDER — LACTATED RINGERS IV SOLN
INTRAVENOUS | Status: DC
  Administered 2019-03-05 (×2): via INTRAVENOUS

## 2019-03-05 MED ORDER — METHYLENE BLUE 0.5 % INJ SOLN
INTRAVENOUS | Status: AC
  Filled 2019-03-05: qty 10

## 2019-03-05 MED ORDER — HEPARIN SOD (PORK) LOCK FLUSH 100 UNIT/ML IV SOLN
INTRAVENOUS | Status: DC | PRN
  Administered 2019-03-05: 500 [IU] via INTRAVENOUS

## 2019-03-05 MED ORDER — OXYCODONE HCL 5 MG PO TABS
5.0000 mg | ORAL_TABLET | Freq: Once | ORAL | Status: AC | PRN
  Administered 2019-03-05: 5 mg via ORAL

## 2019-03-05 MED ORDER — DEXAMETHASONE SODIUM PHOSPHATE 10 MG/ML IJ SOLN
INTRAMUSCULAR | Status: DC | PRN
  Administered 2019-03-05: 5 mg via INTRAVENOUS

## 2019-03-05 MED ORDER — BUPIVACAINE-EPINEPHRINE 0.5% -1:200000 IJ SOLN
INTRAMUSCULAR | Status: DC | PRN
  Administered 2019-03-05: 16 mL

## 2019-03-05 MED ORDER — ONDANSETRON HCL 4 MG/2ML IJ SOLN
INTRAMUSCULAR | Status: AC
  Filled 2019-03-05: qty 2

## 2019-03-05 MED ORDER — CEFAZOLIN SODIUM-DEXTROSE 2-4 GM/100ML-% IV SOLN
2.0000 g | INTRAVENOUS | Status: AC
  Administered 2019-03-05: 13:00:00 2 g via INTRAVENOUS

## 2019-03-05 MED ORDER — LACTATED RINGERS IV SOLN: INTRAVENOUS | Status: DC

## 2019-03-05 MED ORDER — CELECOXIB 200 MG PO CAPS
200.0000 mg | ORAL_CAPSULE | ORAL | Status: AC
  Administered 2019-03-05: 200 mg via ORAL

## 2019-03-05 MED ORDER — PROPOFOL 10 MG/ML IV BOLUS
INTRAVENOUS | Status: DC | PRN
  Administered 2019-03-05: 170 mg via INTRAVENOUS

## 2019-03-05 MED ORDER — GABAPENTIN 300 MG PO CAPS
ORAL_CAPSULE | ORAL | Status: AC
  Filled 2019-03-05: qty 1

## 2019-03-05 MED ORDER — GABAPENTIN 300 MG PO CAPS
300.0000 mg | ORAL_CAPSULE | ORAL | Status: AC
  Administered 2019-03-05: 300 mg via ORAL

## 2019-03-05 MED ORDER — SODIUM CHLORIDE 0.9 % IV SOLN: 250.0000 mL | INTRAVENOUS | Status: DC | PRN

## 2019-03-05 MED ORDER — HYDROMORPHONE HCL 1 MG/ML IJ SOLN: 0.2500 mg | INTRAMUSCULAR | Status: DC | PRN

## 2019-03-05 MED ORDER — FENTANYL CITRATE (PF) 100 MCG/2ML IJ SOLN
50.0000 ug | INTRAMUSCULAR | Status: DC | PRN
  Administered 2019-03-05: 100 ug via INTRAVENOUS

## 2019-03-05 MED ORDER — EPHEDRINE SULFATE-NACL 50-0.9 MG/10ML-% IV SOSY
PREFILLED_SYRINGE | INTRAVENOUS | Status: DC | PRN
  Administered 2019-03-05 (×3): 10 mg via INTRAVENOUS

## 2019-03-05 MED ORDER — SODIUM CHLORIDE 0.9% FLUSH: 3.0000 mL | Freq: Two times a day (BID) | INTRAVENOUS | Status: DC

## 2019-03-05 MED ORDER — OXYCODONE HCL 5 MG PO TABS: 5.0000 mg | ORAL_TABLET | ORAL | Status: DC | PRN

## 2019-03-05 MED ORDER — DEXAMETHASONE SODIUM PHOSPHATE 10 MG/ML IJ SOLN
INTRAMUSCULAR | Status: AC
  Filled 2019-03-05: qty 1

## 2019-03-05 MED ORDER — HEPARIN (PORCINE) IN NACL 2-0.9 UNITS/ML
INTRAMUSCULAR | Status: AC | PRN
  Administered 2019-03-05: 50 mL via INTRAVENOUS

## 2019-03-05 MED ORDER — FENTANYL CITRATE (PF) 250 MCG/5ML IJ SOLN
INTRAMUSCULAR | Status: DC | PRN
  Administered 2019-03-05: 25 ug via INTRAVENOUS

## 2019-03-05 MED ORDER — BUPIVACAINE-EPINEPHRINE (PF) 0.5% -1:200000 IJ SOLN
INTRAMUSCULAR | Status: AC
  Filled 2019-03-05: qty 30

## 2019-03-05 MED ORDER — HEPARIN (PORCINE) IN NACL 1000-0.9 UT/500ML-% IV SOLN
INTRAVENOUS | Status: AC
  Filled 2019-03-05: qty 500

## 2019-03-05 MED ORDER — ACETAMINOPHEN 650 MG RE SUPP: 650.0000 mg | RECTAL | Status: DC | PRN

## 2019-03-05 MED ORDER — SODIUM CHLORIDE (PF) 0.9 % IJ SOLN
INTRAMUSCULAR | Status: AC
  Filled 2019-03-05: qty 10

## 2019-03-05 MED ORDER — OXYCODONE HCL 5 MG/5ML PO SOLN: 5.0000 mg | Freq: Once | ORAL | Status: AC | PRN

## 2019-03-05 MED ORDER — SODIUM CHLORIDE (PF) 0.9 % IJ SOLN
INTRAVENOUS | Status: DC | PRN
  Administered 2019-03-05: 14:00:00 5 mL

## 2019-03-05 MED ORDER — MIDAZOLAM HCL 2 MG/2ML IJ SOLN
INTRAMUSCULAR | Status: AC
  Filled 2019-03-05: qty 2

## 2019-03-05 MED ORDER — CEFAZOLIN SODIUM-DEXTROSE 2-4 GM/100ML-% IV SOLN
INTRAVENOUS | Status: AC
  Filled 2019-03-05: qty 100

## 2019-03-05 MED ORDER — FENTANYL CITRATE (PF) 100 MCG/2ML IJ SOLN: 25.0000 ug | INTRAMUSCULAR | Status: DC | PRN

## 2019-03-05 MED ORDER — HEPARIN SOD (PORK) LOCK FLUSH 100 UNIT/ML IV SOLN
INTRAVENOUS | Status: AC
  Filled 2019-03-05: qty 5

## 2019-03-05 MED ORDER — MIDAZOLAM HCL 2 MG/2ML IJ SOLN
1.0000 mg | INTRAMUSCULAR | Status: DC | PRN
  Administered 2019-03-05: 2 mg via INTRAVENOUS

## 2019-03-05 MED ORDER — MEPERIDINE HCL 25 MG/ML IJ SOLN: 6.2500 mg | INTRAMUSCULAR | Status: DC | PRN

## 2019-03-05 MED ORDER — ACETAMINOPHEN 500 MG PO TABS
1000.0000 mg | ORAL_TABLET | ORAL | Status: AC
  Administered 2019-03-05: 1000 mg via ORAL

## 2019-03-05 SURGICAL SUPPLY — 83 items
APPLIER CLIP 11 MED OPEN (CLIP) ×4
BAG DECANTER FOR FLEXI CONT (MISCELLANEOUS) ×4 IMPLANT
BANDAGE ACE 6X5 VEL STRL LF (GAUZE/BANDAGES/DRESSINGS) IMPLANT
BENZOIN TINCTURE PRP APPL 2/3 (GAUZE/BANDAGES/DRESSINGS) IMPLANT
BINDER BREAST LRG (GAUZE/BANDAGES/DRESSINGS) ×2 IMPLANT
BINDER BREAST MEDIUM (GAUZE/BANDAGES/DRESSINGS) IMPLANT
BINDER BREAST XLRG (GAUZE/BANDAGES/DRESSINGS) IMPLANT
BINDER BREAST XXLRG (GAUZE/BANDAGES/DRESSINGS) IMPLANT
BLADE HEX COATED 2.75 (ELECTRODE) ×4 IMPLANT
BLADE SURG 15 STRL LF DISP TIS (BLADE) ×4 IMPLANT
BLADE SURG 15 STRL SS (BLADE) ×4
CANISTER SUCT 1200ML W/VALVE (MISCELLANEOUS) ×4 IMPLANT
CHLORAPREP W/TINT 26 (MISCELLANEOUS) ×6 IMPLANT
CLIP APPLIE 11 MED OPEN (CLIP) ×2 IMPLANT
CLOSURE WOUND 1/2 X4 (GAUZE/BANDAGES/DRESSINGS)
COVER BACK TABLE REUSABLE LG (DRAPES) ×4 IMPLANT
COVER MAYO STAND REUSABLE (DRAPES) ×4 IMPLANT
COVER PROBE 5X48 (MISCELLANEOUS) ×2
COVER PROBE W GEL 5X96 (DRAPES) ×4 IMPLANT
COVER WAND RF STERILE (DRAPES) IMPLANT
DECANTER SPIKE VIAL GLASS SM (MISCELLANEOUS) IMPLANT
DERMABOND ADVANCED (GAUZE/BANDAGES/DRESSINGS) ×4
DERMABOND ADVANCED .7 DNX12 (GAUZE/BANDAGES/DRESSINGS) ×2 IMPLANT
DRAPE C-ARM 42X72 X-RAY (DRAPES) ×4 IMPLANT
DRAPE LAPAROSCOPIC ABDOMINAL (DRAPES) ×6 IMPLANT
DRAPE UTILITY XL STRL (DRAPES) ×6 IMPLANT
DRSG PAD ABDOMINAL 8X10 ST (GAUZE/BANDAGES/DRESSINGS) ×4 IMPLANT
DRSG TEGADERM 2-3/8X2-3/4 SM (GAUZE/BANDAGES/DRESSINGS) IMPLANT
DRSG TEGADERM 4X10 (GAUZE/BANDAGES/DRESSINGS) IMPLANT
DRSG TEGADERM 4X4.75 (GAUZE/BANDAGES/DRESSINGS) IMPLANT
ELECT REM PT RETURN 9FT ADLT (ELECTROSURGICAL) ×4
ELECTRODE REM PT RTRN 9FT ADLT (ELECTROSURGICAL) ×2 IMPLANT
GAUZE SPONGE 4X4 12PLY STRL (GAUZE/BANDAGES/DRESSINGS) ×4 IMPLANT
GAUZE SPONGE 4X4 12PLY STRL LF (GAUZE/BANDAGES/DRESSINGS) IMPLANT
GLOVE BIOGEL PI IND STRL 6.5 (GLOVE) IMPLANT
GLOVE BIOGEL PI INDICATOR 6.5 (GLOVE) ×2
GLOVE EUDERMIC 7 POWDERFREE (GLOVE) ×8 IMPLANT
GLOVE EXAM NITRILE MD LF STRL (GLOVE) ×4 IMPLANT
GLOVE SURG SS PI 6.5 STRL IVOR (GLOVE) ×4 IMPLANT
GOWN STRL REUS W/ TWL LRG LVL3 (GOWN DISPOSABLE) ×2 IMPLANT
GOWN STRL REUS W/ TWL XL LVL3 (GOWN DISPOSABLE) ×2 IMPLANT
GOWN STRL REUS W/TWL LRG LVL3 (GOWN DISPOSABLE) ×2
GOWN STRL REUS W/TWL XL LVL3 (GOWN DISPOSABLE) ×4
ILLUMINATOR WAVEGUIDE N/F (MISCELLANEOUS) IMPLANT
IV CATH PLACEMENT UNIT 16 GA (IV SOLUTION) IMPLANT
IV KIT MINILOC 20X1 SAFETY (NEEDLE) IMPLANT
KIT CVR 48X5XPRB PLUP LF (MISCELLANEOUS) ×2 IMPLANT
KIT MARKER MARGIN INK (KITS) ×4 IMPLANT
KIT PORT POWER 8FR ISP CVUE (Port) ×4 IMPLANT
LIGHT WAVEGUIDE WIDE FLAT (MISCELLANEOUS) IMPLANT
NDL BLUNT 17GA (NEEDLE) IMPLANT
NDL HYPO 25X1 1.5 SAFETY (NEEDLE) ×4 IMPLANT
NDL SAFETY ECLIPSE 18X1.5 (NEEDLE) ×2 IMPLANT
NEEDLE BLUNT 17GA (NEEDLE) IMPLANT
NEEDLE HYPO 18GX1.5 SHARP (NEEDLE)
NEEDLE HYPO 22GX1.5 SAFETY (NEEDLE) ×4 IMPLANT
NEEDLE HYPO 25X1 1.5 SAFETY (NEEDLE) ×8 IMPLANT
NS IRRIG 1000ML POUR BTL (IV SOLUTION) ×4 IMPLANT
PACK BASIN DAY SURGERY FS (CUSTOM PROCEDURE TRAY) ×4 IMPLANT
PAD ALCOHOL SWAB (MISCELLANEOUS) ×4 IMPLANT
PENCIL BUTTON HOLSTER BLD 10FT (ELECTRODE) ×4 IMPLANT
SET SHEATH INTRODUCER 10FR (MISCELLANEOUS) IMPLANT
SHEATH COOK PEEL AWAY SET 9F (SHEATH) IMPLANT
SHEET MEDIUM DRAPE 40X70 STRL (DRAPES) ×4 IMPLANT
SLEEVE SCD COMPRESS KNEE MED (MISCELLANEOUS) ×4 IMPLANT
SPONGE LAP 18X18 RF (DISPOSABLE) IMPLANT
SPONGE LAP 4X18 RFD (DISPOSABLE) ×6 IMPLANT
STRIP CLOSURE SKIN 1/2X4 (GAUZE/BANDAGES/DRESSINGS) IMPLANT
SUT MNCRL AB 4-0 PS2 18 (SUTURE) ×10 IMPLANT
SUT PROLENE 2 0 CT2 30 (SUTURE) ×4 IMPLANT
SUT SILK 2 0 SH (SUTURE) ×4 IMPLANT
SUT VIC AB 2-0 CT1 27 (SUTURE)
SUT VIC AB 2-0 CT1 TAPERPNT 27 (SUTURE) IMPLANT
SUT VIC AB 3-0 SH 27 (SUTURE)
SUT VIC AB 3-0 SH 27X BRD (SUTURE) IMPLANT
SUT VICRYL 3-0 CR8 SH (SUTURE) ×6 IMPLANT
SYR 10ML LL (SYRINGE) ×8 IMPLANT
SYR 5ML LUER SLIP (SYRINGE) ×4 IMPLANT
TOWEL GREEN STERILE FF (TOWEL DISPOSABLE) ×8 IMPLANT
TRAY FAXITRON CT DISP (TRAY / TRAY PROCEDURE) ×4 IMPLANT
TUBE CONNECTING 20'X1/4 (TUBING) ×1
TUBE CONNECTING 20X1/4 (TUBING) ×3 IMPLANT
YANKAUER SUCT BULB TIP NO VENT (SUCTIONS) ×4 IMPLANT

## 2019-03-05 NOTE — Op Note (Signed)
Patient Name:           Allison Hanna   Date of Surgery:        03/05/2019  Pre op Diagnosis:      Invasive ductal carcinoma left breast, lower outer quadrant, receptor positive, HER-2 positive  Post op Diagnosis:    Same  Procedure:                 Insertion PowerPort [place] 8 French tunneled venous vascular access device                                      Use of fluoroscopy for guidance and positioning                                      Inject blue dye left breast                                      Left breast lumpectomy with radioactive seed localization                                      Left axillary deep sentinel lymph node biopsy  Surgeon:                     Edsel Petrin. Dalbert Batman, M.D., FACS  Assistant:                      OR staff  Operative Indications:    This is a pleasant 58 year old female from [place]. Dr. Gayla Doss is her PCP. Dr. Ronita Hipps is her gynecologist. She was referred to the [facility]  by Dr. Maeola Sarah at the the [facility] for evaluation of left breast cancer. She is seen in the [facility] recently byDr. Jana Hakim, Dr. Sondra Come, and me.       Last mammogram was October 2018. She has no prior breast problems. She noticed a little soreness in her left breast laterally for a few months. She noticed a small lump in her left breast on June 1, 3 weeks ago. Mammogram and ultrasound were performed showing category B density. 8 mm mass in the left breast at the 4 o'clock position, 5 cm from the nipple. Spiculated and palpable. Looks fairly superficial on imaging. 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 No grade was given. ER 100%. PR 0. HER-2 positive. Ki- 67-25% Genetic testing is negative. Family history reveals mother was diagnosed with breast cancer at age 57. She developed a recurrence at age 50. Also a paternal aunt had breast cancer in her 41s.      We had a long discussion about management of breast cancer  in general and especially surgical management. Dr. Jana Hakim has advised adjuvant chemotherapy with Taxol and Herceptin and she is agreeable to that.  We spent a long time discussing the options for surgery including lumpectomy, sentinel lymph node biopsy, Port-A-Cath, adjuvant chemotherapy and radiation therapy. We compared that to mastectomy and sentinel lymph node biopsy with or without reconstruction. She is in favor of breast conservation surgery and think she is a good candidate for that. She knows  that she can always change her mind if she desires more risk reducing surgery in the future, in the setting of a deleterious mutation. Right now she just wants to go ahead and schedule surgery as soon as possible      She'll be scheduled for Port-A-Cath insertion with ultrasound, inject blue dye left breast, left breast lumpectomy with radioactive seed localization, left axillary sentinel lymph node biopsy.   Operative Findings:       The port was inserted uneventfully through the right subclavian vein.  C arm imaging showed good positioning in the catheter flushed well and had excellent blood return.  Chest x-ray is pending     Her breasts are relatively small.  The radioactive seed and titanium marker clip were actually just above the inframammary fold.  This allowed [place] to make an inframammary incision with a hidden scar technique.  The seed and the marker clip were relatively superficial and so I simply lifted the skin and dermis up off of this.  The broad anterior margin is therefore the skin     I found 3 axillary sentinel nodes  Procedure in Detail:          Following the induction of general LMA anesthesia the patient was positioned with a roll behind her shoulders and her arms tucked at her sides.  The neck and chest were prepped and draped in a sterile fashion.  Intravenous antibiotics were given.  0.5% Marcaine with epinephrine was used as a local anesthetic throughout the case.      A right  subclavian venipuncture performed.  Good blood return with the first pass.  Guidewire was inserted easily.  Fluoroscopy confirmed guidewire to be in the superior vena cava.  Using fluoroscopy I marked a template on the chest wall to guide catheter length and positioning.  A small incision was made at the wire insertion site.  Transverse incision was made below the midpoint of the right clavicle.  A subcutaneous pocket was created.  Using a tunneling device I passed the catheter from the wire insertion site to the port pocket site.  Using the template on the chest wall I measured the catheter and cut it 22 cm in length.  The catheter was then secured to the port with the locking device and the entire assembly flushed with heparinized saline.  The port was sutured to the pectoralis fascia with 3 interrupted sutures of 2-0 Prolene.  The dilator and peel-away sheath assembly was inserted over the guidewire into the central venous circulation.  The wire and dilator were removed.  The catheter was threaded easily.  The peel-away sheath was removed.  Catheter flushed easily and had excellent blood return.  Fluoroscopy confirmed that the tip of the catheter was in the superior vena cava at the right atrium and there was no deformity of the catheter anywhere along its course.  The port and catheter were then flushed with concentrated heparin.  The wounds were irrigated.  Subcutaneous tissue was closed with 3-0 Vicryl sutures and the skin incisions were closed with subcuticular 4-0 Monocryl and Dermabond.     The patient was then repositioned with her arms out to her sides.  The entire left chest wall and left axilla were then prepped and draped in a sterile fashion.  Using the neoprobe I identified the radioactive signal at about the 4:30 position just above the inframammary crease.  I chose to make a inframammary crease incision, hidden scar technique.  I then dissected cephalad  lifting the skin and dermis up off of the  breast tissue staying very superficial.  The lumpectomy was performed using the neoprobe and electrocautery.  Specimen was removed and marked with silk sutures and a 6 color ink kit.  The specimen mammogram looked good showing the seed and the marker clip relatively close to one another in the anterior third of the specimen we appear to have good margins.  The specimen was sent to the lab where the seed was retrieved.  Hemostasis was excellent.  This wound was irrigated.  5 metal marker clips were placed in the walls of the lumpectomy cavity.  The breast tissues were reapproximated in several layers with interrupted 3-0 Vicryl and the skin closed with a running subcuticular 4-0 Monocryl and Dermabond.     All the wounds look good.  4 x 4's ABDs and a breast binder were placed.  The patient tolerated the procedure well and was taken to PACU in stable condition.  EBL 25 cc.  Counts correct.  Complications none.   Addendum: I logged onto the PMP aware website and reviewed her prescription medication history.     Edsel Petrin. Dalbert Batman, M.D., FACS General and Minimally Invasive Surgery Breast and Colorectal Surgery  03/05/2019 3:00 PM

## 2019-03-05 NOTE — Anesthesia Preprocedure Evaluation (Signed)
Anesthesia Evaluation  Patient identified by MRN, date of birth, ID band Patient awake    Reviewed: Allergy & Precautions, NPO status , Patient's Chart, lab work & pertinent test results  Airway Mallampati: II  TM Distance: >3 FB Neck ROM: Full    Dental no notable dental hx.    Pulmonary neg pulmonary ROS,    Pulmonary exam normal breath sounds clear to auscultation       Cardiovascular negative cardio ROS Normal cardiovascular exam Rhythm:Regular Rate:Normal     Neuro/Psych negative neurological ROS  negative psych ROS   GI/Hepatic negative GI ROS, Neg liver ROS,   Endo/Other  negative endocrine ROS  Renal/GU negative Renal ROS  negative genitourinary   Musculoskeletal negative musculoskeletal ROS (+)   Abdominal   Peds negative pediatric ROS (+)  Hematology negative hematology ROS (+)   Anesthesia Other Findings   Reproductive/Obstetrics negative OB ROS                             Anesthesia Physical Anesthesia Plan  ASA: II  Anesthesia Plan: General   Post-op Pain Management:  Regional for Post-op pain   Induction: Intravenous  PONV Risk Score and Plan: 3 and Ondansetron, Dexamethasone, Midazolam and Treatment may vary due to age or medical condition  Airway Management Planned: LMA  Additional Equipment:   Intra-op Plan:   Post-operative Plan: Extubation in OR  Informed Consent: I have reviewed the patients History and Physical, chart, labs and discussed the procedure including the risks, benefits and alternatives for the proposed anesthesia with the patient or authorized representative who has indicated his/her understanding and acceptance.     Dental advisory given  Plan Discussed with: CRNA  Anesthesia Plan Comments:         Anesthesia Quick Evaluation

## 2019-03-05 NOTE — Discharge Instructions (Signed)
**No Ibuprofen or Tylenol until after 7pm  Information for Discharge Teaching: EXPAREL (bupivacaine liposome injectable suspension)   Your surgeon or anesthesiologist gave you EXPAREL(bupivacaine) to help control your pain after surgery.   EXPAREL is a local anesthetic that provides pain relief by numbing the tissue around the surgical site.  EXPAREL is designed to release pain medication over time and can control pain for up to 72 hours.  Depending on how you respond to EXPAREL, you may require less pain medication during your recovery.  Possible side effects:  Temporary loss of sensation or ability to move in the area where bupivacaine was injected.  Nausea, vomiting, constipation  Rarely, numbness and tingling in your mouth or lips, lightheadedness, or anxiety may occur.  Call your doctor right away if you think you may be experiencing any of these sensations, or if you have other questions regarding possible side effects.  Follow all other discharge instructions given to you by your surgeon or nurse. Eat a healthy diet and drink plenty of water or other fluids.  If you return to the hospital for any reason within 96 hours following the administration of EXPAREL, it is important for health care providers to know that you have received this anesthetic. A teal colored band has been placed on your arm with the date, time and amount of EXPAREL you have received in order to alert and inform your health care providers. Please leave this armband in place for the full 96 hours following administration, and then you may remove the band.  Post Anesthesia Home Care Instructions  Activity: Get plenty of rest for the remainder of the day. A responsible individual must stay with you for 24 hours following the procedure.  For the next 24 hours, DO NOT: -Drive a car -Paediatric nurse -Drink alcoholic beverages -Take any medication unless instructed by your physician -Make any legal decisions  or sign important papers.  Meals: Start with liquid foods such as gelatin or soup. Progress to regular foods as tolerated. Avoid greasy, spicy, heavy foods. If nausea and/or vomiting occur, drink only clear liquids until the nausea and/or vomiting subsides. Call your physician if vomiting continues.  Special Instructions/Symptoms: Your throat may feel dry or sore from the anesthesia or the breathing tube placed in your throat during surgery. If this causes discomfort, gargle with warm salt water. The discomfort should disappear within 24 hours.  If you had a scopolamine patch placed behind your ear for the management of post- operative nausea and/or vomiting:  1. The medication in the patch is effective for 72 hours, after which it should be removed.  Wrap patch in a tissue and discard in the trash. Wash hands thoroughly with soap and water. 2. You may remove the patch earlier than 72 hours if you experience unpleasant side effects which may include dry mouth, dizziness or visual disturbances. 3. Avoid touching the patch. Wash your hands with soap and water after contact with the patch.      [place] Office Phone Number [phone]  BREAST BIOPSY/ PARTIAL MASTECTOMY: POST OP INSTRUCTIONS  Always review your discharge instruction sheet given to you by the facility where your surgery was performed.  IF YOU HAVE DISABILITY OR FAMILY LEAVE FORMS, YOU MUST BRING THEM TO THE OFFICE FOR PROCESSING.  DO NOT GIVE THEM TO YOUR DOCTOR.  1. A prescription for pain medication may be given to you upon discharge.  Take your pain medication as prescribed, if needed.  If narcotic pain medicine  is not needed, then you may take acetaminophen (Tylenol) or ibuprofen (Advil) as needed. 2. Take your usually prescribed medications unless otherwise directed 3. If you need a refill on your pain medication, please contact your pharmacy.  They will contact our office to request authorization.   Prescriptions will not be filled after 5pm or on week-ends. 4. You should eat very light the first 24 hours after surgery, such as soup, crackers, pudding, etc.  Resume your normal diet the day after surgery. 5. Most patients will experience some swelling and bruising in the breast.  Ice packs and a good support bra will help.  Swelling and bruising can take several days to resolve.  6. It is common to experience some constipation if taking pain medication after surgery.  Increasing fluid intake and taking a stool softener will usually help or prevent this problem from occurring.  A mild laxative (Milk of Magnesia or Miralax) should be taken according to package directions if there are no bowel movements after 48 hours. 7. Unless discharge instructions indicate otherwise, you may remove your bandages 24-48 hours after surgery, and you may shower at that time.  You may have steri-strips (small skin tapes) in place directly over the incision.  These strips should be left on the skin for 7-10 days.  If your surgeon used skin glue on the incision, you may shower in 24 hours.  The glue will flake off over the next 2-3 weeks.  Any sutures or staples will be removed at the office during your follow-up visit. 8. ACTIVITIES:  You may resume regular daily activities (gradually increasing) beginning the next day.  Wearing a good support bra or sports bra minimizes pain and swelling.  You may have sexual intercourse when it is comfortable. a. You may drive when you no longer are taking prescription pain medication, you can comfortably wear a seatbelt, and you can safely maneuver your car and apply brakes. b. RETURN TO WORK:  ______________________________________________________________________________________ 9. You should see your doctor in the office for a follow-up appointment approximately two weeks after your surgery.  Your doctors nurse will typically make your follow-up appointment when she calls you with your  pathology report.  Expect your pathology report 2-3 business days after your surgery.  You may call to check if you do not hear from [place] after three days. 10. OTHER INSTRUCTIONS: _______________________________________________________________________________________________ _____________________________________________________________________________________________________________________________________ _____________________________________________________________________________________________________________________________________ _____________________________________________________________________________________________________________________________________  WHEN TO CALL YOUR DOCTOR: 1. Fever over 101.0 2. Nausea and/or vomiting. 3. Extreme swelling or bruising. 4. Continued bleeding from incision. 5. Increased pain, redness, or drainage from the incision.  The clinic staff is available to answer your questions during regular business hours.  Please dont hesitate to call and ask to speak to one of the nurses for clinical concerns.  If you have a medical emergency, go to the nearest emergency room or call 911.  A surgeon from [facility] is always on call at the hospital.  For further questions, please visit [email or web address]                Managing Your Pain After Surgery Without Opioids    Thank you for participating in our program to help patients manage their pain after surgery without opioids. This is part of our effort to provide you with the best care possible, without exposing you or your family to the risk that opioids pose.  What pain can I expect after surgery? You can expect to have some pain after surgery. This is normal.  The pain is typically worse the day after surgery, and quickly begins to get better. Many studies have found that many patients are able to manage their pain after surgery with Over-the-Counter (OTC)  medications such as Tylenol and Motrin. If you have a condition that does not allow you to take Tylenol or Motrin, notify your surgical team.  How will I manage my pain? The best strategy for controlling your pain after surgery is around the clock pain control with Tylenol (acetaminophen) and Motrin (ibuprofen or Advil). Alternating these medications with each other allows you to maximize your pain control. In addition to Tylenol and Motrin, you can use heating pads or ice packs on your incisions to help reduce your pain.  How will I alternate your regular strength over-the-counter pain medication? You will take a dose of pain medication every three hours. ; Start by taking 650 mg of Tylenol (2 pills of 325 mg) ; 3 hours later take 600 mg of Motrin (3 pills of 200 mg) ; 3 hours after taking the Motrin take 650 mg of Tylenol ; 3 hours after that take 600 mg of Motrin.   - 1 -  See example - if your first dose of Tylenol is at 12:00 PM   12:00 PM Tylenol 650 mg (2 pills of 325 mg)  3:00 PM Motrin 600 mg (3 pills of 200 mg)  6:00 PM Tylenol 650 mg (2 pills of 325 mg)  9:00 PM Motrin 600 mg (3 pills of 200 mg)  Continue alternating every 3 hours   We recommend that you follow this schedule around-the-clock for at least 3 days after surgery, or until you feel that it is no longer needed. Use the table on the last page of this handout to keep track of the medications you are taking. Important: Do not take more than 3000mg  of Tylenol or 3200mg  of Motrin in a 24-hour period. Do not take ibuprofen/Motrin if you have a history of bleeding stomach ulcers, severe kidney disease, &/or actively taking a blood thinner  What if I still have pain? If you have pain that is not controlled with the over-the-counter pain medications (Tylenol and Motrin or Advil) you might have what we call breakthrough pain. You will receive a prescription for a small amount of an opioid pain medication such as Oxycodone,  Tramadol, or Tylenol with Codeine. Use these opioid pills in the first 24 hours after surgery if you have breakthrough pain. Do not take more than 1 pill every 4-6 hours.  If you still have uncontrolled pain after using all opioid pills, don't hesitate to call our staff using the number provided. We will help make sure you are managing your pain in the best way possible, and if necessary, we can provide a prescription for additional pain medication.   Day 1    Time  Name of Medication Number of pills taken  Amount of Acetaminophen  Pain Level   Comments  AM PM       AM PM       AM PM       AM PM       AM PM       AM PM       AM PM       AM PM       Total Daily amount of Acetaminophen Do not take more than  3,000 mg per day      Day 2    Time  Name  of Medication Number of pills taken  Amount of Acetaminophen  Pain Level   Comments  AM PM       AM PM       AM PM       AM PM       AM PM       AM PM       AM PM       AM PM       Total Daily amount of Acetaminophen Do not take more than  3,000 mg per day      Day 3    Time  Name of Medication Number of pills taken  Amount of Acetaminophen  Pain Level   Comments  AM PM       AM PM       AM PM       AM PM          AM PM       AM PM       AM PM       AM PM       Total Daily amount of Acetaminophen Do not take more than  3,000 mg per day      Day 4    Time  Name of Medication Number of pills taken  Amount of Acetaminophen  Pain Level   Comments  AM PM       AM PM       AM PM       AM PM       AM PM       AM PM       AM PM       AM PM       Total Daily amount of Acetaminophen Do not take more than  3,000 mg per day      Day 5    Time  Name of Medication Number of pills taken  Amount of Acetaminophen  Pain Level   Comments  AM PM       AM PM       AM PM       AM PM       AM PM       AM PM       AM PM       AM PM       Total Daily amount of Acetaminophen Do not take more  than  3,000 mg per day       Day 6    Time  Name of Medication Number of pills taken  Amount of Acetaminophen  Pain Level  Comments  AM PM       AM PM       AM PM       AM PM       AM PM       AM PM       AM PM       AM PM       Total Daily amount of Acetaminophen Do not take more than  3,000 mg per day      Day 7    Time  Name of Medication Number of pills taken  Amount of Acetaminophen  Pain Level   Comments  AM PM       AM PM       AM PM       AM PM       AM PM  AM PM       AM PM       AM PM       Total Daily amount of Acetaminophen Do not take more than  3,000 mg per day        For additional information about how and where to safely dispose of unused opioid medications - [employer]  Disclaimer: This document contains information and/or instructional materials adapted from [place] for the typical patient with your condition. It does not replace medical advice from your health care provider because your experience may differ from that of the typical patient. Talk to your health care provider if you have any questions about this document, your condition or your treatment plan. Adapted from [place]

## 2019-03-05 NOTE — Interval H&P Note (Signed)
History and Physical Interval Note:  03/05/2019 12:49 PM  Allison Hanna  has presented today for surgery, with the diagnosis of CANCER LEFT BREAST LOWER OUTER QUADRANT.  The various methods of treatment have been discussed with the patient and family. After consideration of risks, benefits and other options for treatment, the patient has consented to  Procedure(s): INSERTION PORT-A-CATH WITH ULTRASOUND GUIDANCE (N/A) LEFT BREAST LUMPECTOMY WITH RADIOACTIVE SEED AND LEFT AXILLARY DEEP SENTINEL LYMPH NODE BIOPSY INJECT BLUE DYE LEFT BREAST (Left) as a surgical intervention.  The patient's history has been reviewed, patient examined, no change in status, stable for surgery.  I have reviewed the patient's chart and labs.  Questions were answered to the patient's satisfaction.     [name]

## 2019-03-05 NOTE — Transfer of Care (Signed)
Immediate Anesthesia Transfer of Care Note  Patient: Allison Hanna  Procedure(s) Performed: INSERTION PORT-A-CATH (Right Chest) LEFT BREAST LUMPECTOMY WITH RADIOACTIVE SEED AND LEFT AXILLARY DEEP SENTINEL LYMPH NODE BIOPSY INJECT BLUE DYE LEFT BREAST (Left Breast)  Patient Location: PACU  Anesthesia Type:GA combined with regional for post-op pain  Level of Consciousness: awake, alert , oriented and patient cooperative  Airway & Oxygen Therapy: Patient Spontanous Breathing and Patient connected to nasal cannula oxygen  Post-op Assessment: Report given to RN, Post -op Vital signs reviewed and stable and Patient moving all extremities  Post vital signs: Reviewed and stable  Last Vitals:  Vitals Value Taken Time  BP 128/74 03/05/19 1500  Temp    Pulse 78 03/05/19 1502  Resp 15 03/05/19 1502  SpO2 100 % 03/05/19 1502  Vitals shown include unvalidated device data.  Last Pain:  Vitals:   03/05/19 1139  TempSrc: Oral  PainSc: 2       Patients Stated Pain Goal: 0 (78/29/56 2130)  Complications: No apparent anesthesia complications

## 2019-03-05 NOTE — Anesthesia Postprocedure Evaluation (Signed)
Anesthesia Post Note  Patient: ARIADNE RISSMILLER  Procedure(s) Performed: INSERTION PORT-A-CATH (Right Chest) LEFT BREAST LUMPECTOMY WITH RADIOACTIVE SEED AND LEFT AXILLARY DEEP SENTINEL LYMPH NODE BIOPSY INJECT BLUE DYE LEFT BREAST (Left Breast)     Patient location during evaluation: PACU Anesthesia Type: General Level of consciousness: awake and alert Pain management: pain level controlled Vital Signs Assessment: post-procedure vital signs reviewed and stable Respiratory status: spontaneous breathing, nonlabored ventilation and respiratory function stable Cardiovascular status: blood pressure returned to baseline and stable Postop Assessment: no apparent nausea or vomiting Anesthetic complications: no    Last Vitals:  Vitals:   03/05/19 1545 03/05/19 1610  BP: 128/79 133/69  Pulse: 69 67  Resp: 15 16  Temp:  36.7 C  SpO2: 99% 100%    Last Pain:  Vitals:   03/05/19 1610  TempSrc: Oral  PainSc: 5                  Lynda Rainwater

## 2019-03-05 NOTE — Progress Notes (Signed)
Assisted Dr. Carignan with left, ultrasound guided, pectoralis block. Side rails up, monitors on throughout procedure. See vital signs in flow sheet. Tolerated Procedure well. 

## 2019-03-05 NOTE — Anesthesia Procedure Notes (Signed)
Procedure Name: LMA Insertion Date/Time: 03/05/2019 1:16 PM Performed by: Myna Bright, CRNA Pre-anesthesia Checklist: Patient identified, Emergency Drugs available, Suction available and Patient being monitored Patient Re-evaluated:Patient Re-evaluated prior to induction Oxygen Delivery Method: Circle system utilized Preoxygenation: Pre-oxygenation with 100% oxygen Induction Type: IV induction Ventilation: Mask ventilation without difficulty LMA: LMA inserted LMA Size: 4.0 Number of attempts: 1 Placement Confirmation: positive ETCO2 and breath sounds checked- equal and bilateral Tube secured with: Tape Dental Injury: Teeth and Oropharynx as per pre-operative assessment

## 2019-03-05 NOTE — Anesthesia Procedure Notes (Signed)
Anesthesia Regional Block: Pectoralis block   Pre-Anesthetic Checklist: ,, timeout performed, Correct Patient, Correct Site, Correct Laterality, Correct Procedure, Correct Position, site marked, Risks and benefits discussed,  Surgical consent,  Pre-op evaluation,  At surgeon's request and post-op pain management  Laterality: Left  Prep: Maximum Sterile Barrier Precautions used, chloraprep       Needles:  Injection technique: Single-shot  Needle Type: Echogenic Stimulator Needle     Needle Length: 10cm      Additional Needles:   Procedures:,,,, ultrasound used (permanent image in chart),,,,  Narrative:  Start time: 03/05/2019 12:24 PM End time: 03/05/2019 12:34 PM Injection made incrementally with aspirations every 5 mL.  Performed by: Personally  Anesthesiologist: Montez Hageman, MD  Additional Notes: Risks, benefits and alternative to block explained extensively.  Patient tolerated procedure well, without complications.

## 2019-03-06 ENCOUNTER — Encounter (HOSPITAL_BASED_OUTPATIENT_CLINIC_OR_DEPARTMENT_OTHER): Payer: Self-pay | Admitting: General Surgery

## 2019-03-09 NOTE — Progress Notes (Signed)
Inform patient of Pathology report,. Breast pathology shows invasive ductal carcinoma, 1.1 cm. Size  as expected. Margins are negative All nodes are negative. Excellent news  And no further surgery is necessary.  Let me know you reached her,  Thanks, Dalbert Batman

## 2019-03-10 ENCOUNTER — Telehealth: Payer: Self-pay | Admitting: *Deleted

## 2019-03-10 NOTE — Telephone Encounter (Signed)
Spoke to pt concerning next steps and final pathology. Confirmed future appts. Discussed LN and margins are negative and final size of tumor. Discussed current chemo plan of taxol/herceptin and hair loss. Denies further questions at this time.

## 2019-03-12 ENCOUNTER — Other Ambulatory Visit: Payer: Self-pay | Admitting: Oncology

## 2019-03-12 DIAGNOSIS — C50512 Malignant neoplasm of lower-outer quadrant of left female breast: Secondary | ICD-10-CM

## 2019-03-12 MED ORDER — LIDOCAINE-PRILOCAINE 2.5-2.5 % EX CREA: TOPICAL_CREAM | CUTANEOUS | 3 refills | Status: DC

## 2019-03-12 MED ORDER — PROCHLORPERAZINE MALEATE 10 MG PO TABS: 10.0000 mg | ORAL_TABLET | Freq: Four times a day (QID) | ORAL | 1 refills | Status: DC | PRN

## 2019-03-24 NOTE — Progress Notes (Signed)
[facility]  [phone] [phone]     ID: Allison Hanna DOB: February 18, 1961  MR#: [id number]  XBW#:[id number]  Patient Care Team: Merrilee Seashore, MD as PCP - General (Internal Medicine) Mauro Kaufmann, RN as Oncology Nurse Navigator Rockwell Germany, RN as Oncology Nurse Navigator Magrinat, Virgie Dad, MD as Consulting Physician (Oncology) Gery Pray, MD as Consulting Physician (Radiation Oncology) Fanny Skates, MD as Consulting Physician (General Surgery) Brien Few, MD as Consulting Physician (Obstetrics and Gynecology) Chauncey Cruel, MD OTHER MD:  CHIEF COMPLAINT: Estrogen receptor positive breast cancer  CURRENT TREATMENT: Adjuvant chemotherapy   INTERVAL HISTORY: Allison Hanna returns today for follow up of her estrogen receptor positive breast cancer.  Since her last visit, her genetics testing results came back negative.  She underwent echocardiogram on 02/26/2019, which showed an ejection fraction of 60-65%.  She proceeded to left breast lumpectomy with sentinel lymph node biopsy on 03/05/2019 under Dr. Dalbert Batman. Pathology from the procedure [phone]) showed: invasive ductal carcinoma, grade 2, 1.1 cm; margins uninvolved; lymphovascular space invasion present.  Of the eight biopsied lymph nodes, all were negative for carcinoma (0/8).  She also underwent port placement during this procedure. She states it was tender at first, but it is fine now.  She is here today to discuss her adjuvant chemotherapy, which will start 03/31/2019.   REVIEW OF SYSTEMS: Allison Hanna reports surgery went well, and the only issue she experienced was a seroma. She states this was drained of about 50 cc. Her range of motion is excellent. She is walking and doing online yoga; she walks between 1-2 miles. She reports her blood pressure has been elevated, which is unusual for her. She also reports she was seen by an oral surgeon who biopsied a spot on her  gums. A detailed review of systems was otherwise entirely negative.    HISTORY OF CURRENT ILLNESS: From the original intake note:  "Allison Hanna" presented with a palpable left breast mass. She initially experienced soreness to her left breast around April 2020 and tried to get a mammogram at this point, but due to pandemic concerns, the scan was delayed.  She did not actually feel a mass in the breast until June 1.  She underwent bilateral diagnostic mammography with tomography and left breast ultrasonography at [facility] on 02/11/2019 showing: breast density category B; irregular hyperechoic mass in the left breast at 4 o'clock 5 cm from the nipple measuring 8 mm; no enlarged adenopathy in the left axilla.  Accordingly on 02/12/2019 she proceeded to biopsy of the left breast area in question. The pathology from this procedure ([id number]) showed: invasive mammary carcinoma, e-cadherin positive. Prognostic indicators significant for: estrogen receptor, 100% positive with strong staining intensity and progesterone receptor, 0% negative. Proliferation marker Ki67 at 25%. HER2 positive by immunohistochemistry, (3+).  The patient's subsequent history is as detailed below.   PAST MEDICAL HISTORY: Past Medical History:  Diagnosis Date  . Anxiety   . Family history of breast cancer   . Family history of melanoma   . High cholesterol   . Hypothyroid   . Paralyzed vocal cords 2014  . Raynauds syndrome    cold weather triggers fingers to turn white  History of palpitations (resolved)   PAST SURGICAL HISTORY: Past Surgical History:  Procedure Laterality Date  . BREAST LUMPECTOMY WITH RADIOACTIVE SEED AND SENTINEL LYMPH NODE BIOPSY Left 03/05/2019   Procedure: LEFT BREAST LUMPECTOMY WITH RADIOACTIVE SEED AND LEFT AXILLARY DEEP SENTINEL LYMPH NODE BIOPSY INJECT  BLUE DYE LEFT BREAST;  Surgeon: Fanny Skates, MD;  Location: [facility];  Service: General;  Laterality: Left;  . KNEE  SURGERY Bilateral    arthroscopy for menicus tears  . PORTACATH PLACEMENT Right 03/05/2019   Procedure: INSERTION PORT-A-CATH;  Surgeon: Fanny Skates, MD;  Location: [facility];  Service: General;  Laterality: Right;  . WRIST SURGERY      FAMILY HISTORY: Family History  Problem Relation Age of Onset  . Breast cancer Mother 67  . Cervical cancer Mother   . Heart attack Father   . Skin cancer Brother   . Breast cancer Paternal Aunt        dx over 60s  . Breast cancer Paternal Aunt        dx over 20  . Uterine cancer Paternal Aunt 5  . Cervical cancer Maternal Aunt   . Liver cancer Paternal Uncle 76  . Stroke Maternal Grandmother   . Parkinson's disease Paternal Grandfather   . Breast cancer Cousin 72       mother's maternal first cousin  . Melanoma Cousin 73       pat first cousin  Patient's father was 34 years old when he died from heart attack. Patient's mother died at age 73. She was diagnosed with breast cancer at age 77. Two paternal aunts were diagnosed with breast cancer at older ages, one of which also had uterine cancer. She has 1 brother. He has a history of skin cancer.    GYNECOLOGIC HISTORY:  No LMP recorded. Patient is postmenopausal. Menarche: 58 years old Age at first live birth: 58 years old Colbert P 3 LMP 2007 Contraceptive: yes, pill for about 8 years, no complications HRT no  Hysterectomy? no BSO? 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 She is married. Husband Allison Hanna is in charge of operations for [employer]. She lives at home with her husband. Daughter Allison Hanna, age 38, lives in [place], [place] as a Research scientist (life sciences). Daughter Allison Hanna, age 36, lives in [place] as a Careers adviser. Son Allison Hanna, age 31, lives in [place] as a Ship broker and works part-time in [employer]. She has no grandchildren.     ADVANCED DIRECTIVES: Husband Allison Hanna is  automatically her HCPOA.   HEALTH MAINTENANCE: Social History   Tobacco Use  . Smoking status: Never Smoker  . Smokeless tobacco: Never Used  Substance Use Topics  . Alcohol use: Yes    Alcohol/week: 2.0 standard drinks    Types: 2 Standard drinks or equivalent per week  . Drug use: Never     Colonoscopy: 2010  PAP: 08/2018  Bone density: 2019, osteopenia   Not on File  Current Outpatient Medications  Medication Sig Dispense Refill  . calcium carbonate (OS-CAL) 600 MG TABS tablet Take 1,200 mg by mouth 2 (two) times daily with a meal.    . Cholecalciferol (VITAMIN D3) 25 MCG (1000 UT) CAPS Take 2,000 Units by mouth daily.    . Levothyroxine Sodium (SYNTHROID PO) Take 75 mcg by mouth daily.     Marland Kitchen lidocaine-prilocaine (EMLA) cream Apply to affected area once 30 g 3  . Magnesium 250 MG TABS Take 250 mg by mouth daily.    . prochlorperazine (COMPAZINE) 10 MG tablet Take 1 tablet (10 mg total) by mouth every 6 (six) hours as needed (Nausea or vomiting). 30 tablet 1  . vitamin C (ASCORBIC ACID) 500  MG tablet Take 500 mg by mouth daily.     No current facility-administered medications for this visit.     OBJECTIVE: Middle-aged white woman who appears well  Vitals:   03/25/19 1612  BP: 138/78  Pulse: 95  Resp: 18  Temp: 99.8 F (37.7 C)  SpO2: 98%     Body mass index is 25.53 kg/m.   Wt Readings from Last 3 Encounters:  03/25/19 158 lb 3.2 oz (71.8 kg)  03/05/19 155 lb 6.8 oz (70.5 kg)  02/18/19 158 lb 11.2 oz (72 kg)      ECOG FS:1 - Symptomatic but completely ambulatory  Sclerae unicteric, EOMs intact Wearing a mask No cervical or supraclavicular adenopathy Lungs no rales or rhonchi Heart regular rate and rhythm Abd soft, nontender, positive bowel sounds MSK no focal spinal tenderness, no upper extremity lymphedema, excellent left upper extremity range of motion Neuro: nonfocal, well oriented, appropriate affect Breasts: The right breast is benign per the left  breast is status post lumpectomy.  The cosmetic result is excellent.  In the left axilla there is a soft seroma without erythema or tenderness associated with the scar.  LAB RESULTS:  CMP     Component Value Date/Time   NA 136 02/18/2019 1242   K 4.0 02/18/2019 1242   CL 99 02/18/2019 1242   CO2 29 02/18/2019 1242   GLUCOSE 92 02/18/2019 1242   BUN 16 02/18/2019 1242   CREATININE 0.95 02/18/2019 1242   CALCIUM 9.2 02/18/2019 1242   PROT 7.5 02/18/2019 1242   ALBUMIN 4.1 02/18/2019 1242   AST 16 02/18/2019 1242   ALT 14 02/18/2019 1242   ALKPHOS 47 02/18/2019 1242   BILITOT 0.3 02/18/2019 1242   GFRNONAA >60 02/18/2019 1242   GFRAA >60 02/18/2019 1242    No results found for: TOTALPROTELP, ALBUMINELP, A1GS, A2GS, BETS, BETA2SER, GAMS, MSPIKE, SPEI  No results found for: KPAFRELGTCHN, LAMBDASER, KAPLAMBRATIO  Lab Results  Component Value Date   WBC 4.4 02/18/2019   NEUTROABS 2.9 02/18/2019   HGB 12.3 02/18/2019   HCT 36.7 02/18/2019   MCV 91.1 02/18/2019   PLT 297 02/18/2019    _0 @  No results found for: LABCA2  No components found for: RAQTMA263  No results for input(s): INR in the last 168 hours.  No results found for: LABCA2  No results found for: FHL456  No results found for: YBW389  No results found for: HTD428  No results found for: CA2729  No components found for: HGQUANT  No results found for: CEA1 / No results found for: CEA1   No results found for: AFPTUMOR  No results found for: CHROMOGRNA  No results found for: PSA1  No visits with results within 3 Day(s) from this visit.  Latest known visit with results is:  Hospital Outpatient Visit on 03/02/2019  Component Date Value Ref Range Status  . SARS Coronavirus 2 03/02/2019 NEGATIVE  NEGATIVE Final   Comment: (NOTE) SARS-CoV-2 target nucleic acids are NOT DETECTED. The SARS-CoV-2 RNA is generally detectable in upper and lower respiratory specimens during the acute phase of  infection. Negative results do not preclude SARS-CoV-2 infection, do not rule out co-infections with other pathogens, and should not be used as the sole basis for treatment or other patient management decisions. Negative results must be combined with clinical observations, patient history, and epidemiological information. The expected result is Negative. Fact Sheet for Patients: SugarRoll.be Fact Sheet for Healthcare Providers: https://www.woods-mathews.com/ This test is not yet approved or cleared  by the [place] and  has been authorized for detection and/or diagnosis of SARS-CoV-2 by FDA under an Emergency Use Authorization (EUA). This EUA will remain  in effect (meaning this test can be used) for the duration of the COVID-19 declaration under Section 56                          4(b)(1) of the Act, 21 U.S.C. section 360bbb-3(b)(1), unless the authorization is terminated or revoked sooner. Performed at [facility], [place] [address] [place], [place] [address]     (this displays the last labs from the last 3 days)  No results found for: TOTALPROTELP, ALBUMINELP, A1GS, A2GS, BETS, BETA2SER, GAMS, MSPIKE, SPEI (this displays SPEP labs)  No results found for: KPAFRELGTCHN, LAMBDASER, KAPLAMBRATIO (kappa/lambda light chains)  No results found for: HGBA, HGBA2QUANT, HGBFQUANT, HGBSQUAN (Hemoglobinopathy evaluation)   No results found for: LDH  No results found for: IRON, TIBC, IRONPCTSAT (Iron and TIBC)  No results found for: FERRITIN  Urinalysis No results found for: COLORURINE, APPEARANCEUR, LABSPEC, PHURINE, GLUCOSEU, HGBUR, BILIRUBINUR, KETONESUR, PROTEINUR, UROBILINOGEN, NITRITE, LEUKOCYTESUR   STUDIES: Nm Sentinel Node Inj-no Rpt (breast)  Result Date: 03/05/2019 Sulfur colloid was injected by the nuclear medicine technologist for melanoma sentinel node.   Mm Breast Surgical Specimen  Result Date: 03/05/2019  CLINICAL DATA:  Specimen radiograph status post left breast lumpectomy. EXAM: SPECIMEN RADIOGRAPH OF THE LEFT BREAST COMPARISON:  Previous exam(s). FINDINGS: Status post excision of the left breast. The radioactive seed and biopsy marker clip are present present and completely intact. These findings were communicated with the OR at 2:25 p.m. IMPRESSION: Specimen radiograph of the left breast. Electronically Signed   By: Ammie Ferrier M.D.   On: 03/05/2019 14:26   Dg Chest Port 1 View  Result Date: 03/05/2019 CLINICAL DATA:  Status post Port-A-Cath placement. EXAM: PORTABLE CHEST 1 VIEW COMPARISON:  None. FINDINGS: The heart size and mediastinal contours are within normal limits. Both lungs are clear. No pneumothorax or pleural effusion is noted. Right subclavian Port-A-Cath is noted with distal tip in expected position of the SVC. The visualized skeletal structures are unremarkable. IMPRESSION: Right subclavian Port-A-Cath is noted with distal tip in expected position of the SVC. No pneumothorax is noted. Electronically Signed   By: Marijo Conception M.D.   On: 03/05/2019 15:50   Dg Fluoro Guide Cv Line-no Report  Result Date: 03/05/2019 Fluoroscopy was utilized by the requesting physician.  No radiographic interpretation.   Mm Lt Radioactive Seed Loc Mammo Guide  Result Date: 03/04/2019 CLINICAL DATA:  Patient presents for seed localization prior to LEFT lumpectomy for recently diagnosed invasive mammary carcinoma. EXAM: MAMMOGRAPHIC GUIDED RADIOACTIVE SEED LOCALIZATION OF THE LEFT BREAST COMPARISON:  Previous exam(s). FINDINGS: Patient presents for radioactive seed localization prior to lumpectomy. I met with the patient and we discussed the procedure of seed localization including benefits and alternatives. We discussed the high likelihood of a successful procedure. We discussed the risks of the procedure including infection, bleeding, tissue injury and further surgery. We discussed the low dose of  radioactivity involved in the procedure. Informed, written consent was given. The usual time-out protocol was performed immediately prior to the procedure. Using mammographic guidance, sterile technique, 1% lidocaine and an I-125 radioactive seed, the ribbon shaped clip in the LOWER OUTER QUADRANT of the LEFT breast was localized using a LATERAL to MEDIAL approach. The follow-up mammogram images confirm the seed in the expected location  and were marked for Dr. Dalbert Batman. Follow-up survey of the patient confirms presence of the radioactive seed. Order number of I-125 seed:  [id number]. Total activity:  0.2 x 0 millicuries reference Date: 02/10/2019 The patient tolerated the procedure well and was released from the [facility]. She was given instructions regarding seed removal. IMPRESSION: Radioactive seed localization of the LEFT breast. No apparent complications. Electronically Signed   By: Nolon Nations M.D.   On: 03/04/2019 14:14    ELIGIBLE FOR AVAILABLE RESEARCH PROTOCOL: no  ASSESSMENT: 58 y.o. Allison Hanna woman status post left breast biopsy 02/12/2019 for a clinical T1b N0, stage IA invasive ductal carcinoma, estrogen receptor positive, progesterone receptor negative, HER-2 amplified, with an MIB-1-1 of 25%  (1) status post left lumpectomy and sentinel lymph node sampling 03/05/2019 for a pT1c pN0, stage IA invasive ductal carcinoma, grade 2, with positive lymphovascular invasion but negative margins  (a) a total of 8 lymph nodes removed (4 sentinel)   (2) adjuvant chemotherapy with paclitaxel and trastuzumab weekly x12 to start 03/31/2019  (3) trastuzumab to be continued to complete 1 year  (a) echo 02/26/2019 shows an ejection fraction in the 60-65% range  (4) adjuvant radiation to follow  (5) antiestrogens to start at the completion of local treatment  (6) genetics testing 02/25/2019 through the Multi-Gene Panel offered by [facility] found no deleterious mutations in AIP, ALK, APC, ATM,  AXIN2,BAP1,  BARD1, BLM, BMPR1A, BRCA1, BRCA2, BRIP1, CASR, CDC73, CDH1, CDK4, CDKN1B, CDKN1C, CDKN2A (p14ARF), CDKN2A (p16INK4a), CEBPA, CHEK2, CTNNA1, DICER1, DIS3L2, EGFR (c.2369C>T, p.Thr790Met variant only), EPCAM (Deletion/duplication testing only), FH, FLCN, GATA2, GPC3, GREM1 (Promoter region deletion/duplication testing only), HOXB13 (c.251G>A, p.Gly84Glu), HRAS, KIT, MAX, MEN1, MET, MITF (c.952G>A, p.Glu318Lys variant only), MLH1, MSH2, MSH3, MSH6, MUTYH, NBN, NF1, NF2, NTHL1, PALB2, PDGFRA, PHOX2B, PMS2, POLD1, POLE, POT1, PRKAR1A, PTCH1, PTEN, RAD50, RAD51C, RAD51D, RB1, RECQL4, RET, RNF43, RUNX1, SDHAF2, SDHA (sequence changes only), SDHB, SDHC, SDHD, SMAD4, SMARCA4, SMARCB1, SMARCE1, STK11, SUFU, TERC, TERT, TMEM127, TP53, TSC1, TSC2, VHL, WRN and WT1.      PLAN: Javaria did remarkably well with her surgery and is very pleased with the cosmetic result.  I gave her a copy of the pathology and helped her interpret it.  We had a long discussion regarding the possible toxicities side effects and complications of paclitaxel and trastuzumab.  She has already purchased awake and I wrote a prescription for her so she can get the money back for the sales tax at least.  She is also going to meet with our chemotherapy teaching nurse 03/27/2019 to review some of the materials we went over today, and also learn more about the cryo prevention and get a tour of the treatment area  She is interested in continuing to use vaginal estrogens at some point.  If she chooses tamoxifen as her antiestrogen that would be possible.  For now of course that has been discontinued and she is also cutting back on her vitamins.  She is interested in playing tennis and I encouraged that but warned her regarding possible some toxicity  She understands she will have an echocardiogram every 3 months while on trastuzumab.  The plan is to start chemo 03/31/2019.  She will see me a week later with her second dose and we will  review toxicities side effects and complications again at that time  She knows to call for any other issues that may develop before the next visit.  Chauncey Cruel, MD   03/25/2019 4:58 PM Medical Oncology  and Hematology [facility] [address] [place], [place] [address] Tel. [phone]    Fax. [phone]   This document serves as a record of services personally performed by [name], MD. It was created on his behalf by Wilburn Mylar, a trained medical scribe. The creation of this record is based on the scribe's personal observations and the provider's statements to them.   I, [name] MD, have reviewed the above documentation for accuracy and completeness, and I agree with the above.

## 2019-03-25 ENCOUNTER — Encounter: Payer: Self-pay | Admitting: Oncology

## 2019-03-25 ENCOUNTER — Inpatient Hospital Stay: Payer: Managed Care, Other (non HMO) | Attending: Oncology | Admitting: Oncology

## 2019-03-25 ENCOUNTER — Other Ambulatory Visit: Payer: Self-pay

## 2019-03-25 VITALS — BP 138/78 | HR 95 | Temp 99.8°F | Resp 18 | Ht 66.0 in | Wt 158.2 lb

## 2019-03-25 DIAGNOSIS — Z170 Estrogen receptor positive status [ER+]: Secondary | ICD-10-CM | POA: Insufficient documentation

## 2019-03-25 DIAGNOSIS — Z803 Family history of malignant neoplasm of breast: Secondary | ICD-10-CM | POA: Insufficient documentation

## 2019-03-25 DIAGNOSIS — Z808 Family history of malignant neoplasm of other organs or systems: Secondary | ICD-10-CM | POA: Diagnosis not present

## 2019-03-25 DIAGNOSIS — C50512 Malignant neoplasm of lower-outer quadrant of left female breast: Secondary | ICD-10-CM | POA: Diagnosis not present

## 2019-03-25 NOTE — Progress Notes (Signed)
Went to waiting area to introduce myself as Arboriculturist and to offer available resources.  Discussed one-time $1000 Radio broadcast assistant to assist with personal expenses while going through treatment. Based on verbal income guidelines, patient states her household is over the income.  Discussed copay assistance through [facility] for Herceptin. Advised patient how the program works after insurance pays. She thinks she has met ded/OOP for this year. I asked her if she would like for me to apply on her behalf to help cover next year when insurance starts over and she states yes. Advised she would receive a copy of the approval in the mail for her records only. She verbalized understanding.  She has my card for any additional financial questions or concerns.

## 2019-03-26 ENCOUNTER — Encounter: Payer: Self-pay | Admitting: Physical Therapy

## 2019-03-26 ENCOUNTER — Ambulatory Visit: Payer: Managed Care, Other (non HMO) | Attending: General Surgery | Admitting: Physical Therapy

## 2019-03-26 DIAGNOSIS — Z483 Aftercare following surgery for neoplasm: Secondary | ICD-10-CM | POA: Insufficient documentation

## 2019-03-26 DIAGNOSIS — C50512 Malignant neoplasm of lower-outer quadrant of left female breast: Secondary | ICD-10-CM | POA: Diagnosis not present

## 2019-03-26 DIAGNOSIS — Z170 Estrogen receptor positive status [ER+]: Secondary | ICD-10-CM | POA: Diagnosis present

## 2019-03-26 DIAGNOSIS — R293 Abnormal posture: Secondary | ICD-10-CM | POA: Diagnosis present

## 2019-03-26 DIAGNOSIS — R600 Localized edema: Secondary | ICD-10-CM | POA: Diagnosis present

## 2019-03-26 NOTE — Therapy (Signed)
[place], [place], [address] Phone: [phone]   Fax:  [phone]  Physical Therapy Treatment  Patient Details  Name: Allison Hanna MRN: [id number] Date of Birth: 1961-03-10 Referring Provider (PT): Dr. Fanny Skates   Encounter Date: 03/26/2019  PT End of Session - 03/26/19 1206    Visit Number  2    Number of Visits  10    Date for PT Re-Evaluation  04/23/19    PT Start Time  1100    PT Stop Time  1155    PT Time Calculation (min)  55 min    Activity Tolerance  Patient tolerated treatment well    Behavior During Therapy  [facility] for tasks assessed/performed       Past Medical History:  Diagnosis Date  . Anxiety   . Family history of breast cancer   . Family history of melanoma   . High cholesterol   . Hypothyroid   . Paralyzed vocal cords 2014  . Raynauds syndrome    cold weather triggers fingers to turn white    Past Surgical History:  Procedure Laterality Date  . BREAST LUMPECTOMY WITH RADIOACTIVE SEED AND SENTINEL LYMPH NODE BIOPSY Left 03/05/2019   Procedure: LEFT BREAST LUMPECTOMY WITH RADIOACTIVE SEED AND LEFT AXILLARY DEEP SENTINEL LYMPH NODE BIOPSY INJECT BLUE DYE LEFT BREAST;  Surgeon: Fanny Skates, MD;  Location: [facility];  Service: General;  Laterality: Left;  . KNEE SURGERY Bilateral    arthroscopy for menicus tears  . PORTACATH PLACEMENT Right 03/05/2019   Procedure: INSERTION PORT-A-CATH;  Surgeon: Fanny Skates, MD;  Location: [facility];  Service: General;  Laterality: Right;  . WRIST SURGERY      There were no vitals filed for this visit.  Subjective Assessment - 03/26/19 1105    Subjective  Patient underwent a left lumpectomy and SNLB (0/8 nodes positive) on 03/05/2019. She developed a seroma and had 80 cc drained from her axillary area. She had a port placed and will begin chemotherapy on 03/31/2019 since she is HER2 positive. This will  be followed by radiation and anti-estrogen therapy.    Pertinent History  Patient was diagnosed on 02/11/2019 with left invasive ductal carcinoma breast cancer. It measures 8 mm and is located in the lower outer quadrant. It is ER positive, PR negative, and HER2 positive with a Ki67 of 25%. She is otherwise healthy.Patient underwent a left lumpectomy and SNLB (0/8 nodes positive) on 03/05/2019    Patient Stated Goals  Help wtih my arm    Currently in Pain?  Yes    Pain Score  2     Pain Location  Axilla    Pain Orientation  Left    Pain Descriptors / Indicators  Burning    Pain Type  Surgical pain    Pain Onset  1 to 4 weeks ago    Pain Frequency  Intermittent    Aggravating Factors   Lifting    Pain Relieving Factors  Rest    Multiple Pain Sites  No         [facility] PT Assessment - 03/26/19 0001      Assessment   Medical Diagnosis  s/p left lumpectomy and SLNB    Referring Provider (PT)  Dr. Fanny Skates    Onset Date/Surgical Date  03/05/19    Hand Dominance  Right    Prior Therapy  Baseline assessment      Precautions  Precautions  Other (comment)    Precaution Comments  recent surgery      Restrictions   Weight Bearing Restrictions  No      Balance Screen   Has the patient fallen in the past 6 months  No    Has the patient had a decrease in activity level because of a fear of falling?   No    Is the patient reluctant to leave their home because of a fear of falling?   No      Home Film/video editor residence    Living Arrangements  Spouse/significant other    Available Help at Discharge  Family      Prior Function   Level of Independence  Independent    Vocation  Full time employment    Vocation Requirements  VP of Forensic psychologist at [employer]    Leisure  Walking 4-5x/week for ~ 30 minutes      Cognition   Overall Cognitive Status  Within Functional Limits for tasks assessed      Observation/Other Assessments   Observations   Incision inferior to right breast and in axilla are both thickened with poor mobility. There is some firmness just superior to her breast incision. Mild cording present in left axilla.      Posture/Postural Control   Posture/Postural Control  Postural limitations    Postural Limitations  Rounded Shoulders;Forward head      ROM / Strength   AROM / PROM / Strength  AROM      AROM   AROM Assessment Site  Shoulder    Right/Left Shoulder  Left    Left Shoulder Extension  57 Degrees    Left Shoulder Flexion  161 Degrees    Left Shoulder ABduction  177 Degrees    Left Shoulder Internal Rotation  57 Degrees    Left Shoulder External Rotation  89 Degrees        LYMPHEDEMA/ONCOLOGY QUESTIONNAIRE - 03/26/19 1118      Type   Cancer Type  Left breast cancer      Surgeries   Lumpectomy Date  03/05/19    Sentinel Lymph Node Biopsy Date  03/05/19    Number Lymph Nodes Removed  8      Treatment   Active Chemotherapy Treatment  No    Past Chemotherapy Treatment  No    Active Radiation Treatment  No    Past Radiation Treatment  No    Current Hormone Treatment  No    Past Hormone Therapy  No      What other symptoms do you have   Are you Having Heaviness or Tightness  Yes    Are you having Pain  Yes    Are you having pitting edema  No    Is it Hard or Difficult finding clothes that fit  No    Do you have infections  No    Is there Decreased scar mobility  Yes    Stemmer Sign  No      Lymphedema Assessments   Lymphedema Assessments  Upper extremities      Right Upper Extremity Lymphedema   10 cm Proximal to Olecranon Process  26.2 cm    Olecranon Process  23.8 cm    10 cm Proximal to Ulnar Styloid Process  21.5 cm    Just Proximal to Ulnar Styloid Process  15.2 cm    Across Hand at [employer]  19.1 cm  At [facility] of 2nd Digit  5.7 cm      Left Upper Extremity Lymphedema   10 cm Proximal to Olecranon Process  25 cm    Olecranon Process  23.5 cm    10 cm Proximal to Ulnar  Styloid Process  20.1 cm    Just Proximal to Ulnar Styloid Process  14.2 cm    Across Hand at [employer]  17.6 cm    At [place] of 2nd Digit  5.4 cm        Quick Dash - 03/26/19 0001    Open a tight or new jar  Mild difficulty    Do heavy household chores (wash walls, wash floors)  Mild difficulty    Carry a shopping bag or briefcase  No difficulty    Wash your back  Mild difficulty    Use a knife to cut food  No difficulty    Recreational activities in which you take some force or impact through your arm, shoulder, or hand (golf, hammering, tennis)  Mild difficulty    During the past week, to what extent has your arm, shoulder or hand problem interfered with your normal social activities with family, friends, neighbors, or groups?  Not at all    During the past week, to what extent has your arm, shoulder or hand problem limited your work or other regular daily activities  Modererately    Arm, shoulder, or hand pain.  Moderate    Tingling (pins and needles) in your arm, shoulder, or hand  None    Difficulty Sleeping  Moderate difficulty    DASH Score  22.73 %                     PT Education - 03/26/19 1205    Education Details  Gentle scar massage with coconut oil or vitamin E cream to reduce thickness of scar and improve mobility    Person(s) Educated  Patient    Methods  Explanation;Demonstration    Comprehension  Verbalized understanding;Returned demonstration          PT Long Term Goals - 03/26/19 1211      PT LONG TERM GOAL #1   Title  Patient will demonstrate she has regained full shoulder ROM and function post operatively compared to baselines.    Time  8    Period  Weeks    Status  Achieved      PT LONG TERM GOAL #2   Title  Patient will demonstrate she is able to safely demonstrate scar massage to left inferior breast incision.    Time  4    Period  Weeks    Status  New    Target Date  04/23/19      PT LONG TERM GOAL #3   Title  Patient  will report >/= 50% less pain in left axillary region to allow her to return to tennis.    Time  4    Period  Weeks    Status  New    Target Date  04/23/19      PT LONG TERM GOAL #4   Title  Improve DASH score to </= 10 for improved overall upper extremity function.    Time  4    Period  Weeks    Status  New            Plan - 03/26/19 1206    Clinical Impression Statement  Patient is doing very well  s/p left lumpectomy and sentinel node biopsy. She will begin chemotherapy next week as she is HER2 positive. She has some mild axillary cording and scar adhestion/thickness present that would benefit from manual therapy.    Rehab Potential  Excellent    PT Frequency  2x / week    PT Duration  4 weeks    PT Treatment/Interventions  ADLs/Self Care Home Management;Therapeutic exercise;Patient/family education;Manual techniques;Manual lymph drainage;Passive range of motion;Scar mobilization    PT Next Visit Plan  Scar massage, manual lymph drainage, PROM and myofascial release to left axilla    PT Home Exercise Plan  Post op shoulder ROM HEP    Consulted and Agree with Plan of Care  Patient       Patient will benefit from skilled therapeutic intervention in order to improve the following deficits and impairments:  Pain, Impaired UE functional use, Postural dysfunction, Decreased range of motion, Decreased knowledge of precautions, Increased fascial restricitons, Decreased scar mobility  Visit Diagnosis: 1. Malignant neoplasm of lower-outer quadrant of left breast of female, estrogen receptor positive ([place])   2. Abnormal posture   3. Localized edema   4. Aftercare following surgery for neoplasm        Problem List Patient Active Problem List   Diagnosis Date Noted  . Genetic testing 02/26/2019  . Family history of breast cancer   . Family history of melanoma   . Malignant neoplasm of lower-outer quadrant of left breast of female, estrogen receptor positive ([place]) 02/17/2019    Annia Friendly, PT 03/26/19 12:16 PM  [place], [place], [address] Phone: [phone]   Fax:  [phone]  Name: Allison Hanna MRN: [id number] Date of Birth: [birth year]

## 2019-03-27 ENCOUNTER — Inpatient Hospital Stay: Payer: Managed Care, Other (non HMO)

## 2019-03-27 ENCOUNTER — Other Ambulatory Visit: Payer: Self-pay

## 2019-03-30 ENCOUNTER — Telehealth: Payer: Self-pay | Admitting: *Deleted

## 2019-03-30 ENCOUNTER — Encounter: Payer: Self-pay | Admitting: Rehabilitation

## 2019-03-30 ENCOUNTER — Telehealth: Payer: Self-pay

## 2019-03-30 ENCOUNTER — Other Ambulatory Visit: Payer: Self-pay

## 2019-03-30 ENCOUNTER — Ambulatory Visit: Payer: Managed Care, Other (non HMO) | Attending: General Surgery | Admitting: Rehabilitation

## 2019-03-30 ENCOUNTER — Telehealth: Payer: Self-pay | Admitting: Oncology

## 2019-03-30 DIAGNOSIS — Z170 Estrogen receptor positive status [ER+]: Secondary | ICD-10-CM | POA: Insufficient documentation

## 2019-03-30 DIAGNOSIS — Z483 Aftercare following surgery for neoplasm: Secondary | ICD-10-CM | POA: Diagnosis present

## 2019-03-30 DIAGNOSIS — R600 Localized edema: Secondary | ICD-10-CM | POA: Insufficient documentation

## 2019-03-30 DIAGNOSIS — C50512 Malignant neoplasm of lower-outer quadrant of left female breast: Secondary | ICD-10-CM | POA: Insufficient documentation

## 2019-03-30 DIAGNOSIS — R293 Abnormal posture: Secondary | ICD-10-CM

## 2019-03-30 NOTE — Telephone Encounter (Signed)
Called pt per Allison Hanna . - called pt to let her know about appt on 8/4 . Chemo is still waiting on pre auth. Patient is aware that appt may needs to be rescheduled and understands that she should still come in for labs.

## 2019-03-30 NOTE — Therapy (Signed)
[place], [place], [address] Phone: [phone]   Fax:  [phone]  Physical Therapy Treatment  Patient Details  Name: Allison Hanna MRN: [id number] Date of Birth: 09/24/1960 Referring Provider (PT): Dr. Fanny Skates   Encounter Date: 03/30/2019  PT End of Session - 03/30/19 0834    Visit Number  3    Number of Visits  10    Date for PT Re-Evaluation  04/23/19    PT Start Time  0832    PT Stop Time  0917    PT Time Calculation (min)  45 min    Activity Tolerance  Patient tolerated treatment well    Behavior During Therapy  [facility] for tasks assessed/performed       Past Medical History:  Diagnosis Date  . Anxiety   . Family history of breast cancer   . Family history of melanoma   . High cholesterol   . Hypothyroid   . Paralyzed vocal cords 2014  . Raynauds syndrome    cold weather triggers fingers to turn white    Past Surgical History:  Procedure Laterality Date  . BREAST LUMPECTOMY WITH RADIOACTIVE SEED AND SENTINEL LYMPH NODE BIOPSY Left 03/05/2019   Procedure: LEFT BREAST LUMPECTOMY WITH RADIOACTIVE SEED AND LEFT AXILLARY DEEP SENTINEL LYMPH NODE BIOPSY INJECT BLUE DYE LEFT BREAST;  Surgeon: Fanny Skates, MD;  Location: [facility];  Service: General;  Laterality: Left;  . KNEE SURGERY Bilateral    arthroscopy for menicus tears  . PORTACATH PLACEMENT Right 03/05/2019   Procedure: INSERTION PORT-A-CATH;  Surgeon: Fanny Skates, MD;  Location: [facility];  Service: General;  Laterality: Right;  . WRIST SURGERY      There were no vitals filed for this visit.  Subjective Assessment - 03/30/19 0835    Subjective  I am doing well.  I have been stretching and doing my massage.  The skin just feels tight. I played a little tennis on Saturday.    Pertinent History  Patient was diagnosed on 02/11/2019 with left invasive ductal carcinoma breast cancer. It  measures 8 mm and is located in the lower outer quadrant. It is ER positive, PR negative, and HER2 positive with a Ki67 of 25%. She is otherwise healthy.Patient underwent a left lumpectomy and SNLB (0/8 nodes positive) on 03/05/2019    Currently in Pain?  Yes    Pain Location  Axilla    Pain Orientation  Left    Pain Descriptors / Indicators  Burning    Pain Type  Surgical pain    Pain Onset  1 to 4 weeks ago    Pain Frequency  Intermittent                       [facility] Adult PT Treatment/Exercise - 03/30/19 0001      Exercises   Exercises  Shoulder      Shoulder Exercises: Standing   Row  Both;10 reps    Theraband Level (Shoulder Row)  Level 1 (Yellow)    Row Limitations  with cueing for first time       Shoulder Exercises: Pulleys   Flexion  2 minutes    Flexion Limitations  with cueing for instruction    ABduction  2 minutes    ABduction Limitations  with cueing for instruction      Shoulder Exercises: Therapy Ball   Flexion  Both;10 reps  Manual Therapy   Manual Therapy  Soft tissue mobilization;Passive ROM    Manual therapy comments  education on silicone scar sheets given handout on purchasing    Soft tissue mobilization  scar tissue cross friction using massage lotion    Passive ROM  to the Lt shoulder as tolerated                  PT Long Term Goals - 03/26/19 1211      PT LONG TERM GOAL #1   Title  Patient will demonstrate she has regained full shoulder ROM and function post operatively compared to baselines.    Time  8    Period  Weeks    Status  Achieved      PT LONG TERM GOAL #2   Title  Patient will demonstrate she is able to safely demonstrate scar massage to left inferior breast incision.    Time  4    Period  Weeks    Status  New    Target Date  04/23/19      PT LONG TERM GOAL #3   Title  Patient will report >/= 50% less pain in left axillary region to allow her to return to tennis.    Time  4    Period  Weeks     Status  New    Target Date  04/23/19      PT LONG TERM GOAL #4   Title  Improve DASH score to </= 10 for improved overall upper extremity function.    Time  4    Period  Weeks    Status  New            Plan - 03/30/19 0919    Clinical Impression Statement  Pt doing much better today with minimal pull with AROM/AAROM/PROM slight pull overhead and with abd/ER near the axilla.  Possible cording/puckering in the axilla but pt reports improved in the upper arm.  Started gym AAROM, PROM, and scar work today tolerated all well.    PT Frequency  2x / week    PT Duration  4 weeks    PT Treatment/Interventions  ADLs/Self Care Home Management;Therapeutic exercise;Patient/family education;Manual techniques;Manual lymph drainage;Passive range of motion;Scar mobilization    PT Next Visit Plan  Scar massage, manual lymph drainage PRN, PROM and myofascial release to left axilla, update HEP with more stretches past post op    Consulted and Agree with Plan of Care  Patient       Patient will benefit from skilled therapeutic intervention in order to improve the following deficits and impairments:     Visit Diagnosis: 1. Malignant neoplasm of lower-outer quadrant of left breast of female, estrogen receptor positive ([place])   2. Abnormal posture   3. Localized edema   4. Aftercare following surgery for neoplasm        Problem List Patient Active Problem List   Diagnosis Date Noted  . Genetic testing 02/26/2019  . Family history of breast cancer   . Family history of melanoma   . Malignant neoplasm of lower-outer quadrant of left breast of female, estrogen receptor positive ([place]) 02/17/2019    Stark Bray 03/30/2019, 9:21 AM  [place], [place], [address] Phone: [phone]   Fax:  [phone]  Name: DAVISHA LINTHICUM MRN: [id number] Date of Birth: 1960-12-28

## 2019-03-30 NOTE — Telephone Encounter (Signed)
Called patient to let her know that her prior authorization has not happened. The patient informed me that Christella Scheuermann was unaware of any chemo she would be getting and they gave her a number to call.  The number given to her was [phone].  She has my Recruitment consultant) phone number to call and check on her PA in the morning.  Gardiner Rhyme

## 2019-03-30 NOTE — Progress Notes (Signed)
The following biosimilar Ogivri ([id number]-[id number]) has been selected for use in this patient.  Pt may want to s/w Stefanie Libel (financial) to discuss copay assistance for Ogivri when she is here 03/31/19.  Kennith Center, Pharm.D., CPP 03/30/2019@1 :22 PM

## 2019-03-31 ENCOUNTER — Other Ambulatory Visit: Payer: Self-pay | Admitting: Oncology

## 2019-03-31 ENCOUNTER — Telehealth: Payer: Self-pay | Admitting: *Deleted

## 2019-03-31 ENCOUNTER — Inpatient Hospital Stay: Payer: Managed Care, Other (non HMO)

## 2019-03-31 NOTE — Patient Instructions (Signed)
[facility] Discharge Instructions for Patients Receiving Chemotherapy  Today you received the following chemotherapy agents Taxol and Immunotherapy agent: Trastuzumab  To help prevent nausea and vomiting after your treatment, we encourage you to take your nausea medication as directed by your MD.  If you develop nausea and vomiting that is not controlled by your nausea medication, call the clinic.   BELOW ARE SYMPTOMS THAT SHOULD BE REPORTED IMMEDIATELY:  *FEVER GREATER THAN 100.5 F  *CHILLS WITH OR WITHOUT FEVER  NAUSEA AND VOMITING THAT IS NOT CONTROLLED WITH YOUR NAUSEA MEDICATION  *UNUSUAL SHORTNESS OF BREATH  *UNUSUAL BRUISING OR BLEEDING  TENDERNESS IN MOUTH AND THROAT WITH OR WITHOUT PRESENCE OF ULCERS  *URINARY PROBLEMS  *BOWEL PROBLEMS  UNUSUAL RASH Items with * indicate a potential emergency and should be followed up as soon as possible.  Feel free to call the clinic should you have any questions or concerns. The clinic phone number is [phone].  Please show the [facility] [facility] at check-in to the Emergency Department and triage nurse. Paclitaxel injection What is this medicine? PACLITAXEL (PAK li TAX el) is a chemotherapy drug. It targets fast dividing cells, like cancer cells, and causes these cells to die. This medicine is used to treat ovarian cancer, breast cancer, lung cancer, Kaposi's sarcoma, and other cancers. This medicine may be used for other purposes; ask your health care provider or pharmacist if you have questions. COMMON BRAND NAME(S): Onxol, Taxol What should I tell my health care provider before I take this medicine? They need to know if you have any of these conditions:  history of irregular heartbeat  liver disease  low blood counts, like low white cell, platelet, or red cell counts  lung or breathing disease, like asthma  tingling of the fingers or toes, or other nerve disorder  an unusual or allergic reaction to  paclitaxel, alcohol, polyoxyethylated castor oil, other chemotherapy, other medicines, foods, dyes, or preservatives  pregnant or trying to get pregnant  breast-feeding How should I use this medicine? This drug is given as an infusion into a vein. It is administered in a hospital or clinic by a specially trained health care professional. Talk to your pediatrician regarding the use of this medicine in children. Special care may be needed. Overdosage: If you think you have taken too much of this medicine contact a poison control center or emergency room at once. NOTE: This medicine is only for you. Do not share this medicine with others. What if I miss a dose? It is important not to miss your dose. Call your doctor or health care professional if you are unable to keep an appointment. What may interact with this medicine? Do not take this medicine with any of the following medications:  disulfiram  metronidazole This medicine may also interact with the following medications:  antiviral medicines for hepatitis, HIV or AIDS  certain antibiotics like erythromycin and clarithromycin  certain medicines for fungal infections like ketoconazole and itraconazole  certain medicines for seizures like carbamazepine, phenobarbital, phenytoin  gemfibrozil  nefazodone  rifampin  St. John's wort This list may not describe all possible interactions. Give your health care provider a list of all the medicines, herbs, non-prescription drugs, or dietary supplements you use. Also tell them if you smoke, drink alcohol, or use illegal drugs. Some items may interact with your medicine. What should I watch for while using this medicine? Your condition will be monitored carefully while you are receiving this medicine. You  will need important blood work done while you are taking this medicine. This medicine can cause serious allergic reactions. To reduce your risk you will need to take other medicine(s)  before treatment with this medicine. If you experience allergic reactions like skin rash, itching or hives, swelling of the face, lips, or tongue, tell your doctor or health care professional right away. In some cases, you may be given additional medicines to help with side effects. Follow all directions for their use. This drug may make you feel generally unwell. This is not uncommon, as chemotherapy can affect healthy cells as well as cancer cells. Report any side effects. Continue your course of treatment even though you feel ill unless your doctor tells you to stop. Call your doctor or health care professional for advice if you get a fever, chills or sore throat, or other symptoms of a cold or flu. Do not treat yourself. This drug decreases your body's ability to fight infections. Try to avoid being around people who are sick. This medicine may increase your risk to bruise or bleed. Call your doctor or health care professional if you notice any unusual bleeding. Be careful brushing and flossing your teeth or using a toothpick because you may get an infection or bleed more easily. If you have any dental work done, tell your dentist you are receiving this medicine. Avoid taking products that contain aspirin, acetaminophen, ibuprofen, naproxen, or ketoprofen unless instructed by your doctor. These medicines may hide a fever. Do not become pregnant while taking this medicine. Women should inform their doctor if they wish to become pregnant or think they might be pregnant. There is a potential for serious side effects to an unborn child. Talk to your health care professional or pharmacist for more information. Do not breast-feed an infant while taking this medicine. Men are advised not to father a child while receiving this medicine. This product may contain alcohol. Ask your pharmacist or healthcare provider if this medicine contains alcohol. Be sure to tell all healthcare providers you are taking this  medicine. Certain medicines, like metronidazole and disulfiram, can cause an unpleasant reaction when taken with alcohol. The reaction includes flushing, headache, nausea, vomiting, sweating, and increased thirst. The reaction can last from 30 minutes to several hours. What side effects may I notice from receiving this medicine? Side effects that you should report to your doctor or health care professional as soon as possible:  allergic reactions like skin rash, itching or hives, swelling of the face, lips, or tongue  breathing problems  changes in vision  fast, irregular heartbeat  high or low blood pressure  mouth sores  pain, tingling, numbness in the hands or feet  signs of decreased platelets or bleeding - bruising, pinpoint red spots on the skin, black, tarry stools, blood in the urine  signs of decreased red blood cells - unusually weak or tired, feeling faint or lightheaded, falls  signs of infection - fever or chills, cough, sore throat, pain or difficulty passing urine  signs and symptoms of liver injury like dark yellow or brown urine; general ill feeling or flu-like symptoms; light-colored stools; loss of appetite; nausea; right upper belly pain; unusually weak or tired; yellowing of the eyes or skin  swelling of the ankles, feet, hands  unusually slow heartbeat Side effects that usually do not require medical attention (report to your doctor or health care professional if they continue or are bothersome):  diarrhea  hair loss  loss of appetite    muscle or joint pain  nausea, vomiting  pain, redness, or irritation at site where injected  tiredness This list may not describe all possible side effects. Call your doctor for medical advice about side effects. You may report side effects to FDA at 1-800-FDA-1088. Where should I keep my medicine? This drug is given in a hospital or clinic and will not be stored at home. NOTE: This sheet is a summary. It may not  cover all possible information. If you have questions about this medicine, talk to your doctor, pharmacist, or health care provider.  2020 Elsevier/Gold Standard (2017-04-16 13:14:55)  Trastuzumab; Hyaluronidase injection What is this medicine? TRASTUZUMAB; HYALURONIDASE (tras TOO zoo mab / hye al ur ON i dase) is used to treat breast cancer and stomach cancer. Trastuzumab is a monoclonal antibody. Hyaluronidase is used to improve the effects of trastuzumab. This medicine may be used for other purposes; ask your health care provider or pharmacist if you have questions. COMMON BRAND NAME(S): HERCEPTIN HYLECTA What should I tell my health care provider before I take this medicine? They need to know if you have any of these conditions:  heart disease  heart failure  lung or breathing disease, like asthma  an unusual or allergic reaction to trastuzumab, or other medications, foods, dyes, or preservatives  pregnant or trying to get pregnant  breast-feeding How should I use this medicine? This medicine is for injection under the skin. It is given by a health care professional in a hospital or clinic setting. Talk to your pediatrician regarding the use of this medicine in children. This medicine is not approved for use in children. Overdosage: If you think you have taken too much of this medicine contact a poison control center or emergency room at once. NOTE: This medicine is only for you. Do not share this medicine with others. What if I miss a dose? It is important not to miss a dose. Call your doctor or health care professional if you are unable to keep an appointment. What may interact with this medicine? This medicine may interact with the following medications:  certain types of chemotherapy, such as daunorubicin, doxorubicin, epirubicin, and idarubicin This list may not describe all possible interactions. Give your health care provider a list of all the medicines, herbs,  non-prescription drugs, or dietary supplements you use. Also tell them if you smoke, drink alcohol, or use illegal drugs. Some items may interact with your medicine. What should I watch for while using this medicine? Visit your doctor for checks on your progress. Report any side effects. Continue your course of treatment even though you feel ill unless your doctor tells you to stop. Call your doctor or health care professional for advice if you get a fever, chills or sore throat, or other symptoms of a cold or flu. Do not treat yourself. Try to avoid being around people who are sick. You may experience fever, chills and shaking during your first infusion. These effects are usually mild and can be treated with other medicines. Report any side effects during the infusion to your health care professional. Fever and chills usually do not happen with later infusions. Do not become pregnant while taking this medicine or for 7 months after stopping it. Women should inform their doctor if they wish to become pregnant or think they might be pregnant. Women of child-bearing potential will need to have a negative pregnancy test before starting this medicine. There is a potential for serious side effects to an unborn  child. Talk to your health care professional or pharmacist for more information. Do not breast-feed an infant while taking this medicine or for 7 months after stopping it. What side effects may I notice from receiving this medicine? Side effects that you should report to your doctor or health care professional as soon as possible:  allergic reactions like skin rash, itching or hives, swelling of the face, lips, or tongue  breathing problems  chest pain or palpitations  cough  fever  general ill feeling or flu-like symptoms  signs of worsening heart failure like breathing problems; swelling in your legs and feet Side effects that usually do not require medical attention (report these to your  doctor or health care professional if they continue or are bothersome):  bone pain  changes in taste  diarrhea  joint pain  nausea/vomiting  unusually weak or tired  weight loss This list may not describe all possible side effects. Call your doctor for medical advice about side effects. You may report side effects to FDA at 1-800-FDA-1088. Where should I keep my medicine? This drug is given in a hospital or clinic and will not be stored at home. NOTE: This sheet is a summary. It may not cover all possible information. If you have questions about this medicine, talk to your doctor, pharmacist, or health care provider.  2020 Elsevier/Gold Standard (2017-11-01 21:54:17) Coronavirus (COVID-19) Are you at risk?  Are you at risk for the Coronavirus (COVID-19)?  To be considered HIGH RISK for Coronavirus (COVID-19), you have to meet the following criteria:  . Traveled to [place], [place], [place], [place] or [place]; or in the [place] to [place], [place], [place], or [place]; and have fever, cough, and shortness of breath within the last 2 weeks of travel OR . Been in close contact with a person diagnosed with COVID-19 within the last 2 weeks and have fever, cough, and shortness of breath . IF YOU DO NOT MEET THESE CRITERIA, YOU ARE CONSIDERED LOW RISK FOR COVID-19.  What to do if you are HIGH RISK for COVID-19?  Marland Kitchen If you are having a medical emergency, call 911. . Seek medical care right away. Before you go to a doctor's office, urgent care or emergency department, call ahead and tell them about your recent travel, contact with someone diagnosed with COVID-19, and your symptoms. You should receive instructions from your physician's office regarding next steps of care.  . When you arrive at healthcare provider, tell the healthcare staff immediately you have returned from visiting [place], [place], [place], [place] or [place]; or traveled in the [place] to [place], [place], [place], or [place]; in the last two weeks or you have been in close contact with a person diagnosed with COVID-19 in the last 2 weeks.   . Tell the health care staff about your symptoms: fever, cough and shortness of breath. . After you have been seen by a medical provider, you will be either: o Tested for (COVID-19) and discharged home on quarantine except to seek medical care if symptoms worsen, and asked to  - Stay home and avoid contact with others until you get your results (4-5 days)  - Avoid travel on public transportation if possible (such as bus, train, or airplane) or o Sent to the Emergency Department by EMS for evaluation, COVID-19 testing, and possible admission depending on your condition and test results.  What to do if you are LOW RISK for COVID-19?  Reduce  your risk of any infection by using the same precautions used for avoiding the common cold or flu:  Marland Kitchen Wash your hands often with soap and warm water for at least 20 seconds.  If soap and water are not readily available, use an alcohol-based hand sanitizer with at least 60% alcohol.  . If coughing or sneezing, cover your mouth and nose by coughing or sneezing into the elbow areas of your shirt or coat, into a tissue or into your sleeve (not your hands). . Avoid shaking hands with others and consider head nods or verbal greetings only. . Avoid touching your eyes, nose, or mouth with unwashed hands.  . Avoid close contact with people who are sick. . Avoid places or events with large numbers of people in one location, like concerts or sporting events. . Carefully consider travel plans you have or are making. . If you are planning any travel outside or inside the [place], visit the CDC's Travelers' Health webpage for the latest health notices. . If you have some symptoms but not all symptoms, continue to monitor at home and seek medical attention if your symptoms worsen. . If you are having a medical emergency, call  911.   [facility] / e-Visit: [email or web address]         [facility]: [facility]: [phone]                   [facility]: [phone]

## 2019-03-31 NOTE — Telephone Encounter (Signed)
Left vm for pt to return call with any questions or needs prior to start of chemo. Contact information provided.

## 2019-03-31 NOTE — Telephone Encounter (Signed)
Called pt and discussed since chemo has yet to be authorized that she will start chemo on 8/11 as already scheduled. Received verbal understanding.

## 2019-04-01 ENCOUNTER — Other Ambulatory Visit: Payer: Self-pay

## 2019-04-01 ENCOUNTER — Telehealth: Payer: Self-pay | Admitting: *Deleted

## 2019-04-01 ENCOUNTER — Encounter: Payer: Self-pay | Admitting: Rehabilitation

## 2019-04-01 ENCOUNTER — Ambulatory Visit: Payer: Managed Care, Other (non HMO) | Admitting: Rehabilitation

## 2019-04-01 DIAGNOSIS — R293 Abnormal posture: Secondary | ICD-10-CM

## 2019-04-01 DIAGNOSIS — R600 Localized edema: Secondary | ICD-10-CM

## 2019-04-01 DIAGNOSIS — Z483 Aftercare following surgery for neoplasm: Secondary | ICD-10-CM

## 2019-04-01 DIAGNOSIS — C50512 Malignant neoplasm of lower-outer quadrant of left female breast: Secondary | ICD-10-CM

## 2019-04-01 NOTE — Telephone Encounter (Signed)
Prescription sent for lumpectomy bra fitting to [facility].

## 2019-04-01 NOTE — Therapy (Signed)
[place], [place], [address] Phone: [phone]   Fax:  [phone]  Physical Therapy Treatment  Patient Details  Name: Allison Hanna MRN: [id number] Date of Birth: [birth year] Referring Provider (PT): Dr. Fanny Skates   Encounter Date: 04/01/2019  PT End of Session - 04/01/19 0920    Visit Number  4    Number of Visits  10    Date for PT Re-Evaluation  04/23/19    PT Start Time  0840    PT Stop Time  0918    PT Time Calculation (min)  38 min    Activity Tolerance  Patient tolerated treatment well    Behavior During Therapy  [facility] for tasks assessed/performed       Past Medical History:  Diagnosis Date  . Anxiety   . Family history of breast cancer   . Family history of melanoma   . High cholesterol   . Hypothyroid   . Paralyzed vocal cords 2014  . Raynauds syndrome    cold weather triggers fingers to turn white    Past Surgical History:  Procedure Laterality Date  . BREAST LUMPECTOMY WITH RADIOACTIVE SEED AND SENTINEL LYMPH NODE BIOPSY Left 03/05/2019   Procedure: LEFT BREAST LUMPECTOMY WITH RADIOACTIVE SEED AND LEFT AXILLARY DEEP SENTINEL LYMPH NODE BIOPSY INJECT BLUE DYE LEFT BREAST;  Surgeon: Fanny Skates, MD;  Location: [facility];  Service: General;  Laterality: Left;  . KNEE SURGERY Bilateral    arthroscopy for menicus tears  . PORTACATH PLACEMENT Right 03/05/2019   Procedure: INSERTION PORT-A-CATH;  Surgeon: Fanny Skates, MD;  Location: [facility];  Service: General;  Laterality: Right;  . WRIST SURGERY      There were no vitals filed for this visit.  Subjective Assessment - 04/01/19 0840    Subjective  No complaints. Still doing well.    Pertinent History  Patient was diagnosed on 02/11/2019 with left invasive ductal carcinoma breast cancer. It measures 8 mm and is located in the lower outer quadrant. It is ER positive, PR negative, and HER2  positive with a Ki67 of 25%. She is otherwise healthy.Patient underwent a left lumpectomy and SNLB (0/8 nodes positive) on 03/05/2019    Currently in Pain?  No/denies         [facility] PT Assessment - 04/01/19 0001      Strength   Overall Strength Comments  5/5 all motions    Strength Assessment Site  Shoulder    Right/Left Shoulder  Right;Left                   [facility] Adult PT Treatment/Exercise - 04/01/19 0001      Exercises   Exercises  Other Exercises    Other Exercises   instructed pt in moving up to 2-3 sets of 10 and then switching to the red band at home      Shoulder Exercises: Standing   Row  Both;10 reps    Theraband Level (Shoulder Row)  Level 2 (Red)    Other Standing Exercises  3 way raise 2# x 10 each       Shoulder Exercises: Pulleys   Flexion  2 minutes    ABduction  2 minutes    Other Pulley Exercises  IR stretch not tight       Shoulder Exercises: Therapy Ball   Flexion  Both;10 reps    ABduction  Left;10 reps    ABduction Limitations  with instruction for performance      Manual Therapy   Soft tissue mobilization  scar tissue cross friction using massage lotion    Passive ROM  to the Lt shoulder as tolerated                  PT Long Term Goals - 04/01/19 4076      PT LONG TERM GOAL #1   Title  Patient will demonstrate she has regained full shoulder ROM and function post operatively compared to baselines.    Status  Achieved      PT LONG TERM GOAL #2   Title  Patient will demonstrate she is able to safely demonstrate scar massage to left inferior breast incision.    Status  Achieved      PT LONG TERM GOAL #3   Title  Patient will report >/= 50% less pain in left axillary region to allow her to return to tennis.    Status  Achieved      PT LONG TERM GOAL #4   Title  Improve DASH score to </= 10 for improved overall upper extremity function.    Status  On-going            Plan - 04/01/19 0920    Clinical Impression  Statement  Pt doing excellent today.  Full ROM and PROM with slight pull only with end range flexion.  Scar appearing better today as well as pt has been working it with vit E oil.  Some cording in the axilla present but does not seemto limit any motion.  Increased exercises today and pt should be ready for DC in 1-2 visits.    PT Treatment/Interventions  ADLs/Self Care Home Management;Therapeutic exercise;Patient/family education;Manual techniques;Manual lymph drainage;Passive range of motion;Scar mobilization    PT Next Visit Plan  Scar massage, PROM and myofascial release to left axilla, continues ROM and postural exercises (needs new handout for supine/seated scap and 3 way raises along with any new stretches for final HEP)    PT Home Exercise Plan  Post op shoulder ROM HEP    Consulted and Agree with Plan of Care  Patient       Patient will benefit from skilled therapeutic intervention in order to improve the following deficits and impairments:     Visit Diagnosis: 1. Malignant neoplasm of lower-outer quadrant of left breast of female, estrogen receptor positive ([place])   2. Abnormal posture   3. Localized edema   4. Aftercare following surgery for neoplasm        Problem List Patient Active Problem List   Diagnosis Date Noted  . Genetic testing 02/26/2019  . Family history of breast cancer   . Family history of melanoma   . Malignant neoplasm of lower-outer quadrant of left breast of female, estrogen receptor positive ([place]) 02/17/2019    Stark Bray 04/01/2019, 9:24 AM  [place], [place], [address] Phone: [phone]   Fax:  [phone]  Name: Allison Hanna MRN: [id number] Date of Birth: [birth year]

## 2019-04-02 ENCOUNTER — Telehealth: Payer: Self-pay | Admitting: Adult Health

## 2019-04-02 NOTE — Telephone Encounter (Signed)
Received a call for a peer to peer for Allison Hanna, 05/28/1961 regarding her chemotherapy regimen for Taxol and Herceptin from Dr. Lehrich.  She had documented that the patient had T1N1 Her2 positive breast cancer. I clarified with Dr. Lehrich that the patient had T1cN0 breast cancer.  Dr. Lehrich couldn't find where this was in the documentation we provided.  Nonetheless the authorization number is [id number].    Time spent 5 minutes   , NP 

## 2019-04-06 NOTE — Progress Notes (Signed)
[name]  [phone] [phone]) [phone]     ID: Allison Hanna DOB: [birth year]  MR#: [id number]  QBH#:[id number]  Patient Care Team: Merrilee Seashore, MD as PCP - General (Internal Medicine) Mauro Kaufmann, RN as Oncology Nurse Navigator Rockwell Germany, RN as Oncology Nurse Navigator Taneia Mealor, Virgie Dad, MD as Consulting Physician (Oncology) Gery Pray, MD as Consulting Physician (Radiation Oncology) Fanny Skates, MD as Consulting Physician (General Surgery) Brien Few, MD as Consulting Physician (Obstetrics and Gynecology) Larey Dresser, MD as Consulting Physician (Cardiology) Chauncey Cruel, MD OTHER MD:  CHIEF COMPLAINT: Estrogen receptor positive breast cancer  CURRENT TREATMENT: Adjuvant chemotherapy   INTERVAL HISTORY: Allison Hanna returns today for follow up and treatment of her estrogen receptor positive breast cancer.  She is scheduled to begin her adjuvant chemotherapy today. This is day 1 cycle 1 of 12 planned weekly doses of paclitaxel and trastuzumab (Ogivri). She plans to use ginger tea to help with nausea.   REVIEW OF SYSTEMS: Allison Hanna reports she has been using vitamin E oil on her skin with great results.  She is playing tennis and walking for exercise.  She also met with Dr. 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  A detailed review of systems was otherwise entirely negative.    HISTORY OF CURRENT ILLNESS: From the original intake note:  "Allison Hanna" presented with a palpable left breast mass. She initially experienced soreness to her left breast around April 2020 and tried to get a mammogram at this point, but due to pandemic concerns, the scan was delayed.  She did not actually feel a mass in the breast until June 1.  She underwent bilateral diagnostic mammography with tomography and left breast ultrasonography at [facility] on 02/11/2019  showing: breast density category B; irregular hyperechoic mass in the left breast at 4 o'clock 5 cm from the nipple measuring 8 mm; no enlarged adenopathy in the left axilla.  Accordingly on 02/12/2019 she proceeded to biopsy of the left breast area in question. The pathology from this procedure ([id number]) showed: invasive mammary carcinoma, e-cadherin positive. Prognostic indicators significant for: estrogen receptor, 100% positive with strong staining intensity and progesterone receptor, 0% negative. Proliferation marker Ki67 at 25%. HER2 positive by immunohistochemistry, (3+).  The patient's subsequent history is as detailed below.   PAST MEDICAL HISTORY: Past Medical History:  Diagnosis Date  . Anxiety   . Family history of breast cancer   . Family history of melanoma   . High cholesterol   . Hypothyroid   . Paralyzed vocal cords 2014  . Raynauds syndrome    cold weather triggers fingers to turn white  History of palpitations (resolved)   PAST SURGICAL HISTORY: Past Surgical History:  Procedure Laterality Date  . BREAST LUMPECTOMY WITH RADIOACTIVE SEED AND SENTINEL LYMPH NODE BIOPSY Left 03/05/2019   Procedure: LEFT BREAST LUMPECTOMY WITH RADIOACTIVE SEED AND LEFT AXILLARY DEEP SENTINEL LYMPH NODE BIOPSY INJECT BLUE DYE LEFT BREAST;  Surgeon: Fanny Skates, MD;  Location: [facility];  Service: General;  Laterality: Left;  . KNEE SURGERY Bilateral    arthroscopy for menicus tears  . PORTACATH PLACEMENT Right 03/05/2019   Procedure: INSERTION PORT-A-CATH;  Surgeon: Fanny Skates, MD;  Location: [facility];  Service: General;  Laterality: Right;  . WRIST SURGERY      FAMILY HISTORY: Family History  Problem Relation Age of Onset  . Breast cancer Mother  96  . Cervical cancer Mother   . Heart attack Father   . Skin cancer Brother   . Breast cancer Paternal Aunt        dx over 105s  . Breast cancer Paternal Aunt        dx over 82  . Uterine  cancer Paternal Aunt 55  . Cervical cancer Maternal Aunt   . Liver cancer Paternal Uncle 24  . Stroke Maternal Grandmother   . Parkinson's disease Paternal Grandfather   . Breast cancer Cousin 33       mother's maternal first cousin  . Melanoma Cousin 59       pat first cousin  Patient's father was 70 years old when he died from heart attack. Patient's mother died at age 37. She was diagnosed with breast cancer at age 64. Two paternal aunts were diagnosed with breast cancer at older ages, one of which also had uterine cancer. She has 1 brother. He has a history of skin cancer.    GYNECOLOGIC HISTORY:  No LMP recorded. Patient is postmenopausal. Menarche: 58 years old Age at first live birth: 58 years old [place] P 3 LMP 2007 Contraceptive: yes, pill for about 8 years, no complications HRT no  Hysterectomy? no BSO? 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 She is married. [name] is in charge of operations for [employer]. She lives at home with her husband. [name], age 30, lives in [place], [place] as a Research scientist (life sciences). [name], age 80, lives in [place] as a Careers adviser. [name], age 43, lives in [place] as a Ship broker and works part-time in [employer]. She has no grandchildren.     ADVANCED DIRECTIVES: [name] is automatically her HCPOA.   HEALTH MAINTENANCE: Social History   Tobacco Use  . Smoking status: Never Smoker  . Smokeless tobacco: Never Used  Substance Use Topics  . Alcohol use: Yes    Alcohol/week: 2.0 standard drinks    Types: 2 Standard drinks or equivalent per week  . Drug use: Never     Colonoscopy: 2010  PAP: 08/2018  Bone density: 2019, osteopenia   Not on File  Current Outpatient Medications  Medication Sig Dispense Refill  . calcium carbonate (OS-CAL) 600 MG TABS tablet Take 1,200 mg by mouth 2 (two) times daily with a  meal.    . Cholecalciferol (VITAMIN D3) 25 MCG (1000 UT) CAPS Take 2,000 Units by mouth daily.    . Levothyroxine Sodium (SYNTHROID PO) Take 75 mcg by mouth daily.     Marland Kitchen lidocaine-prilocaine (EMLA) cream Apply to affected area once 30 g 3  . Magnesium 250 MG TABS Take 250 mg by mouth daily.    . prochlorperazine (COMPAZINE) 10 MG tablet Take 1 tablet (10 mg total) by mouth every 6 (six) hours as needed (Nausea or vomiting). 30 tablet 1  . vitamin C (ASCORBIC ACID) 500 MG tablet Take 500 mg by mouth daily.     No current facility-administered medications for this visit.     OBJECTIVE: Middle-aged white woman who appears well  Vitals:   04/07/19 0944  BP: (!) 160/73  Pulse: 69  Resp: 20  Temp: 98 F (36.7 C)  SpO2: 100%     Body mass index is 25.74 kg/m.   Wt Readings from Last 3 Encounters:  04/07/19 159 lb 8 oz (72.3 kg)  03/25/19 158 lb  3.2 oz (71.8 kg)  03/05/19 155 lb 6.8 oz (70.5 kg)      ECOG FS:1 - Symptomatic but completely ambulatory  Sclerae unicteric, EOMs intact Wearing a mask No cervical or supraclavicular adenopathy Lungs no rales or rhonchi Heart regular rate and rhythm Abd soft, nontender, positive bowel sounds MSK no focal spinal tenderness, no upper extremity lymphedema Neuro: nonfocal, well oriented, appropriate affect Breasts: The incisions over the right breast and the port are healing very nicely, with excellent cosmetic results.   LAB RESULTS:  CMP     Component Value Date/Time   NA 136 02/18/2019 1242   K 4.0 02/18/2019 1242   CL 99 02/18/2019 1242   CO2 29 02/18/2019 1242   GLUCOSE 92 02/18/2019 1242   BUN 16 02/18/2019 1242   CREATININE 0.95 02/18/2019 1242   CALCIUM 9.2 02/18/2019 1242   PROT 7.5 02/18/2019 1242   ALBUMIN 4.1 02/18/2019 1242   AST 16 02/18/2019 1242   ALT 14 02/18/2019 1242   ALKPHOS 47 02/18/2019 1242   BILITOT 0.3 02/18/2019 1242   GFRNONAA >60 02/18/2019 1242   GFRAA >60 02/18/2019 1242    No results found  for: TOTALPROTELP, ALBUMINELP, A1GS, A2GS, BETS, BETA2SER, GAMS, MSPIKE, SPEI  No results found for: KPAFRELGTCHN, LAMBDASER, KAPLAMBRATIO  Lab Results  Component Value Date   WBC 3.9 (L) 04/07/2019   NEUTROABS 2.4 04/07/2019   HGB 11.9 (L) 04/07/2019   HCT 35.6 (L) 04/07/2019   MCV 91.0 04/07/2019   PLT 270 04/07/2019    @LASTCHEMISTRY @  No results found for: LABCA2  No components found for: [id number]  No results for input(s): INR in the last 168 hours.  No results found for: LABCA2  No results found for: WUJ811  No results found for: BJY782  No results found for: NFA213  No results found for: CA2729  No components found for: HGQUANT  No results found for: CEA1 / No results found for: CEA1   No results found for: AFPTUMOR  No results found for: CHROMOGRNA  No results found for: PSA1  Appointment on 04/07/2019  Component Date Value Ref Range Status  . WBC 04/07/2019 3.9* 4.0 - 10.5 K/uL Final  . RBC 04/07/2019 3.91  3.87 - 5.11 MIL/uL Final  . Hemoglobin 04/07/2019 11.9* 12.0 - 15.0 g/dL Final  . HCT 04/07/2019 35.6* 36.0 - 46.0 % Final  . MCV 04/07/2019 91.0  80.0 - 100.0 fL Final  . MCH 04/07/2019 30.4  26.0 - 34.0 pg Final  . MCHC 04/07/2019 33.4  30.0 - 36.0 g/dL Final  . RDW 04/07/2019 12.0  11.5 - 15.5 % Final  . Platelets 04/07/2019 270  150 - 400 K/uL Final  . nRBC 04/07/2019 0.0  0.0 - 0.2 % Final  . Neutrophils Relative % 04/07/2019 62  % Final  . Neutro Abs 04/07/2019 2.4  1.7 - 7.7 K/uL Final  . Lymphocytes Relative 04/07/2019 23  % Final  . Lymphs Abs 04/07/2019 0.9  0.7 - 4.0 K/uL Final  . Monocytes Relative 04/07/2019 8  % Final  . Monocytes Absolute 04/07/2019 0.3  0.1 - 1.0 K/uL Final  . Eosinophils Relative 04/07/2019 4  % Final  . Eosinophils Absolute 04/07/2019 0.2  0.0 - 0.5 K/uL Final  . Basophils Relative 04/07/2019 2  % Final  . Basophils Absolute 04/07/2019 0.1  0.0 - 0.1 K/uL Final  . Immature Granulocytes 04/07/2019 1  % Final   . Abs Immature Granulocytes 04/07/2019 0.02  0.00 - 0.07 K/uL  Final   Performed at [facility], [place] [address], [place], [place] [address]    (this displays the last labs from the last 3 days)  No results found for: TOTALPROTELP, ALBUMINELP, A1GS, A2GS, BETS, BETA2SER, GAMS, MSPIKE, SPEI (this displays SPEP labs)  No results found for: KPAFRELGTCHN, LAMBDASER, KAPLAMBRATIO (kappa/lambda light chains)  No results found for: HGBA, HGBA2QUANT, HGBFQUANT, HGBSQUAN (Hemoglobinopathy evaluation)   No results found for: LDH  No results found for: IRON, TIBC, IRONPCTSAT (Iron and TIBC)  No results found for: FERRITIN  Urinalysis No results found for: COLORURINE, APPEARANCEUR, LABSPEC, PHURINE, GLUCOSEU, HGBUR, BILIRUBINUR, KETONESUR, PROTEINUR, UROBILINOGEN, NITRITE, LEUKOCYTESUR   STUDIES: No results found.  ELIGIBLE FOR AVAILABLE RESEARCH PROTOCOL: no  ASSESSMENT: 58 y.o. [place] woman status post left breast biopsy 02/12/2019 for a clinical T1b N0, stage IA invasive ductal carcinoma, estrogen receptor positive, progesterone receptor negative, HER-2 amplified, with an MIB-1-1 of 25%  (1) status post left lumpectomy and sentinel lymph node sampling 03/05/2019 for a pT1c pN0, stage IA invasive ductal carcinoma, grade 2, with positive lymphovascular invasion but negative margins  (a) a total of 8 lymph nodes removed (4 sentinel)   (2) adjuvant chemotherapy with paclitaxel and trastuzumab weekly x12 to start 04/07/2019  (3) trastuzumab to be continued to complete 1 year  (a) echo 02/26/2019 shows an ejection fraction in the 60-65% range  (4) adjuvant radiation to follow  (5) antiestrogens to start at the completion of local treatment  (6) genetics testing 02/25/2019 through the Multi-Gene Panel offered by [facility] found no deleterious mutations in AIP, ALK, APC, ATM, AXIN2,BAP1,  BARD1, BLM, BMPR1A, BRCA1, BRCA2, BRIP1, CASR, CDC73, CDH1, CDK4, CDKN1B,  CDKN1C, CDKN2A (p14ARF), CDKN2A (p16INK4a), CEBPA, CHEK2, CTNNA1, DICER1, DIS3L2, EGFR (c.2369C>T, p.Thr790Met variant only), EPCAM (Deletion/duplication testing only), FH, FLCN, GATA2, GPC3, GREM1 (Promoter region deletion/duplication testing only), HOXB13 (c.251G>A, p.Gly84Glu), HRAS, KIT, MAX, MEN1, MET, MITF (c.952G>A, p.Glu318Lys variant only), MLH1, MSH2, MSH3, MSH6, MUTYH, NBN, NF1, NF2, NTHL1, PALB2, PDGFRA, PHOX2B, PMS2, POLD1, POLE, POT1, PRKAR1A, PTCH1, PTEN, RAD50, RAD51C, RAD51D, RB1, RECQL4, RET, RNF43, RUNX1, SDHAF2, SDHA (sequence changes only), SDHB, SDHC, SDHD, SMAD4, SMARCA4, SMARCB1, SMARCE1, STK11, SUFU, TERC, TERT, TMEM127, TP53, TSC1, TSC2, VHL, WRN and WT1.      PLAN: Allison Hanna is starting her adjuvant chemotherapy today.  We again reviewed the possible toxicities side effects and complications.  She has already bought 2 weeks expecting to lose her hair within a couple of weeks.  She also has Compazine on hand and I have asked her to take Compazine tonight before supper and tomorrow morning before breakfast, after which she may take it only as needed.  I have asked her to keep a diary of all side effects that she may have over the next week and so that next week when she returns to see me we can troubleshoot any problems.  Of course she will also call before next week if she has any issues she is not sure how to handle  She knows to call for any other issue that may develop before her return visit.   Chauncey Cruel, MD   04/07/2019 10:25 AM Medical Oncology and Hematology [facility] [address] [place],  [address] Tel. [phone]    Fax. [phone]   This document serves as a record of services personally performed by [name], MD. It was created on his behalf by Wilburn Mylar, a trained medical scribe. The creation of this record is based on  the scribe's personal observations and the provider's statements to them.   I, [name] MD, have reviewed the above documentation for accuracy and completeness, and I agree with the above.

## 2019-04-07 ENCOUNTER — Inpatient Hospital Stay (HOSPITAL_BASED_OUTPATIENT_CLINIC_OR_DEPARTMENT_OTHER): Payer: Managed Care, Other (non HMO) | Admitting: Oncology

## 2019-04-07 ENCOUNTER — Inpatient Hospital Stay: Payer: Managed Care, Other (non HMO)

## 2019-04-07 ENCOUNTER — Inpatient Hospital Stay: Payer: Managed Care, Other (non HMO) | Attending: Oncology

## 2019-04-07 ENCOUNTER — Other Ambulatory Visit: Payer: Self-pay

## 2019-04-07 VITALS — BP 160/73 | HR 69 | Temp 98.0°F | Resp 20 | Ht 66.0 in | Wt 159.5 lb

## 2019-04-07 VITALS — BP 152/85 | HR 57 | Temp 98.2°F | Resp 16

## 2019-04-07 DIAGNOSIS — Z5111 Encounter for antineoplastic chemotherapy: Secondary | ICD-10-CM | POA: Diagnosis present

## 2019-04-07 DIAGNOSIS — Z803 Family history of malignant neoplasm of breast: Secondary | ICD-10-CM | POA: Diagnosis not present

## 2019-04-07 DIAGNOSIS — C50512 Malignant neoplasm of lower-outer quadrant of left female breast: Secondary | ICD-10-CM | POA: Insufficient documentation

## 2019-04-07 DIAGNOSIS — Z5112 Encounter for antineoplastic immunotherapy: Secondary | ICD-10-CM | POA: Insufficient documentation

## 2019-04-07 DIAGNOSIS — Z79899 Other long term (current) drug therapy: Secondary | ICD-10-CM | POA: Insufficient documentation

## 2019-04-07 DIAGNOSIS — Z170 Estrogen receptor positive status [ER+]: Secondary | ICD-10-CM | POA: Diagnosis not present

## 2019-04-07 DIAGNOSIS — M8580 Other specified disorders of bone density and structure, unspecified site: Secondary | ICD-10-CM | POA: Insufficient documentation

## 2019-04-07 DIAGNOSIS — Z95828 Presence of other vascular implants and grafts: Secondary | ICD-10-CM

## 2019-04-07 LAB — COMPREHENSIVE METABOLIC PANEL
ALT: 13 U/L (ref 0–44)
AST: 16 U/L (ref 15–41)
Albumin: 4 g/dL (ref 3.5–5.0)
Alkaline Phosphatase: 39 U/L (ref 38–126)
Anion gap: 7 (ref 5–15)
BUN: 15 mg/dL (ref 6–20)
CO2: 28 mmol/L (ref 22–32)
Calcium: 9.2 mg/dL (ref 8.9–10.3)
Chloride: 99 mmol/L (ref 98–111)
Creatinine, Ser: 0.81 mg/dL (ref 0.44–1.00)
GFR calc Af Amer: >60 mL/min (ref >60)
GFR calc non Af Amer: >60 mL/min (ref >60)
Glucose, Bld: 98 mg/dL (ref 70–99)
Potassium: 4.3 mmol/L (ref 3.5–5.1)
Sodium: 134 mmol/L — ABNORMAL LOW (ref 135–145)
Total Bilirubin: 0.7 mg/dL (ref 0.3–1.2)
Total Protein: 7.3 g/dL (ref 6.5–8.1)

## 2019-04-07 LAB — CBC WITH DIFFERENTIAL/PLATELET
Abs Immature Granulocytes: 0.02 10*3/uL (ref 0.00–0.07)
Basophils Absolute: 0.1 10*3/uL (ref 0.0–0.1)
Basophils Relative: 2 %
Eosinophils Absolute: 0.2 10*3/uL (ref 0.0–0.5)
Eosinophils Relative: 4 %
HCT: 35.6 % — ABNORMAL LOW (ref 36.0–46.0)
Hemoglobin: 11.9 g/dL — ABNORMAL LOW (ref 12.0–15.0)
Immature Granulocytes: 1 %
Lymphocytes Relative: 23 %
Lymphs Abs: 0.9 10*3/uL (ref 0.7–4.0)
MCH: 30.4 pg (ref 26.0–34.0)
MCHC: 33.4 g/dL (ref 30.0–36.0)
MCV: 91 fL (ref 80.0–100.0)
Monocytes Absolute: 0.3 10*3/uL (ref 0.1–1.0)
Monocytes Relative: 8 %
Neutro Abs: 2.4 10*3/uL (ref 1.7–7.7)
Neutrophils Relative %: 62 %
Platelets: 270 10*3/uL (ref 150–400)
RBC: 3.91 MIL/uL (ref 3.87–5.11)
RDW: 12 % (ref 11.5–15.5)
WBC: 3.9 10*3/uL — ABNORMAL LOW (ref 4.0–10.5)
nRBC: 0 % (ref 0.0–0.2)

## 2019-04-07 MED ORDER — HEPARIN SOD (PORK) LOCK FLUSH 100 UNIT/ML IV SOLN
500.0000 [IU] | Freq: Once | INTRAVENOUS | Status: AC | PRN
  Administered 2019-04-07: 500 [IU]
  Filled 2019-04-07: qty 5

## 2019-04-07 MED ORDER — SODIUM CHLORIDE 0.9% FLUSH
10.0000 mL | INTRAVENOUS | Status: DC | PRN
  Administered 2019-04-07: 10 mL via INTRAVENOUS
  Filled 2019-04-07: qty 10

## 2019-04-07 MED ORDER — DEXAMETHASONE SODIUM PHOSPHATE 10 MG/ML IJ SOLN
10.0000 mg | Freq: Once | INTRAMUSCULAR | Status: AC
  Administered 2019-04-07: 14:00:00 10 mg via INTRAVENOUS

## 2019-04-07 MED ORDER — TRASTUZUMAB-DKST CHEMO 150 MG IV SOLR
300.0000 mg | Freq: Once | INTRAVENOUS | Status: AC
  Administered 2019-04-07: 12:00:00 300 mg via INTRAVENOUS
  Filled 2019-04-07: qty 14.29

## 2019-04-07 MED ORDER — FAMOTIDINE IN NACL 20-0.9 MG/50ML-% IV SOLN
20.0000 mg | Freq: Once | INTRAVENOUS | Status: AC
  Administered 2019-04-07: 20 mg via INTRAVENOUS

## 2019-04-07 MED ORDER — ACETAMINOPHEN 325 MG PO TABS
ORAL_TABLET | ORAL | Status: AC
  Filled 2019-04-07: qty 2

## 2019-04-07 MED ORDER — ACETAMINOPHEN 325 MG PO TABS
650.0000 mg | ORAL_TABLET | Freq: Once | ORAL | Status: AC
  Administered 2019-04-07: 650 mg via ORAL

## 2019-04-07 MED ORDER — DIPHENHYDRAMINE HCL 50 MG/ML IJ SOLN
25.0000 mg | Freq: Once | INTRAMUSCULAR | Status: AC
  Administered 2019-04-07: 11:00:00 25 mg via INTRAVENOUS

## 2019-04-07 MED ORDER — SODIUM CHLORIDE 0.9% FLUSH
10.0000 mL | INTRAVENOUS | Status: DC | PRN
  Administered 2019-04-07: 10 mL
  Filled 2019-04-07: qty 10

## 2019-04-07 MED ORDER — SODIUM CHLORIDE 0.9 % IV SOLN
80.0000 mg/m2 | Freq: Once | INTRAVENOUS | Status: AC
  Administered 2019-04-07: 150 mg via INTRAVENOUS
  Filled 2019-04-07: qty 25

## 2019-04-07 MED ORDER — DIPHENHYDRAMINE HCL 50 MG/ML IJ SOLN
INTRAMUSCULAR | Status: AC
  Filled 2019-04-07: qty 1

## 2019-04-07 MED ORDER — FAMOTIDINE IN NACL 20-0.9 MG/50ML-% IV SOLN
INTRAVENOUS | Status: AC
  Filled 2019-04-07: qty 50

## 2019-04-07 MED ORDER — SODIUM CHLORIDE 0.9 % IV SOLN
Freq: Once | INTRAVENOUS | Status: AC
  Administered 2019-04-07: 11:00:00 via INTRAVENOUS
  Filled 2019-04-07: qty 250

## 2019-04-07 MED ORDER — DEXAMETHASONE SODIUM PHOSPHATE 10 MG/ML IJ SOLN
INTRAMUSCULAR | Status: AC
  Filled 2019-04-07: qty 1

## 2019-04-07 NOTE — Progress Notes (Signed)
Pt. BP 150/92, recheck 169/84. Pt. denies complaints of chest pain, dizziness, anxiousness, and no shortness of breath noted. Dr. Jana Hakim notified and to continue with titration Taxol.

## 2019-04-07 NOTE — Patient Instructions (Signed)
[facility] Discharge Instructions for Patients Receiving Chemotherapy  Today you received the following chemotherapy agents Taxol and Immunotherapy agent: Trastuzumab-xxxx  To help prevent nausea and vomiting after your treatment, we encourage you to take your nausea medication as directed by your MD.   If you develop nausea and vomiting that is not controlled by your nausea medication, call the clinic.   BELOW ARE SYMPTOMS THAT SHOULD BE REPORTED IMMEDIATELY:  *FEVER GREATER THAN 100.5 F  *CHILLS WITH OR WITHOUT FEVER  NAUSEA AND VOMITING THAT IS NOT CONTROLLED WITH YOUR NAUSEA MEDICATION  *UNUSUAL SHORTNESS OF BREATH  *UNUSUAL BRUISING OR BLEEDING  TENDERNESS IN MOUTH AND THROAT WITH OR WITHOUT PRESENCE OF ULCERS  *URINARY PROBLEMS  *BOWEL PROBLEMS  UNUSUAL RASH Items with * indicate a potential emergency and should be followed up as soon as possible.  Feel free to call the clinic should you have any questions or concerns. The clinic phone number is [phone].  Please show the [facility] at check-in to the Emergency Department and triage nurse. Trastuzumab; Hyaluronidase injection What is this medicine? TRASTUZUMAB; HYALURONIDASE (tras TOO zoo mab / hye al ur ON i dase) is used to treat breast cancer and stomach cancer. Trastuzumab is a monoclonal antibody. Hyaluronidase is used to improve the effects of trastuzumab. This medicine may be used for other purposes; ask your health care provider or pharmacist if you have questions. COMMON BRAND NAME(S): HERCEPTIN HYLECTA What should I tell my health care provider before I take this medicine? They need to know if you have any of these conditions:  heart disease  heart failure  lung or breathing disease, like asthma  an unusual or allergic reaction to trastuzumab, or other medications, foods, dyes, or preservatives  pregnant or trying to get pregnant  breast-feeding How should I use this  medicine? This medicine is for injection under the skin. It is given by a health care professional in a hospital or clinic setting. Talk to your pediatrician regarding the use of this medicine in children. This medicine is not approved for use in children. Overdosage: If you think you have taken too much of this medicine contact a poison control center or emergency room at once. NOTE: This medicine is only for you. Do not share this medicine with others. What if I miss a dose? It is important not to miss a dose. Call your doctor or health care professional if you are unable to keep an appointment. What may interact with this medicine? This medicine may interact with the following medications:  certain types of chemotherapy, such as daunorubicin, doxorubicin, epirubicin, and idarubicin This list may not describe all possible interactions. Give your health care provider a list of all the medicines, herbs, non-prescription drugs, or dietary supplements you use. Also tell them if you smoke, drink alcohol, or use illegal drugs. Some items may interact with your medicine. What should I watch for while using this medicine? Visit your doctor for checks on your progress. Report any side effects. Continue your course of treatment even though you feel ill unless your doctor tells you to stop. Call your doctor or health care professional for advice if you get a fever, chills or sore throat, or other symptoms of a cold or flu. Do not treat yourself. Try to avoid being around people who are sick. You may experience fever, chills and shaking during your first infusion. These effects are usually mild and can be treated with other medicines. Report any  side effects during the infusion to your health care professional. Fever and chills usually do not happen with later infusions. Do not become pregnant while taking this medicine or for 7 months after stopping it. Women should inform their doctor if they wish to become  pregnant or think they might be pregnant. Women of child-bearing potential will need to have a negative pregnancy test before starting this medicine. There is a potential for serious side effects to an unborn child. Talk to your health care professional or pharmacist for more information. Do not breast-feed an infant while taking this medicine or for 7 months after stopping it. What side effects may I notice from receiving this medicine? Side effects that you should report to your doctor or health care professional as soon as possible:  allergic reactions like skin rash, itching or hives, swelling of the face, lips, or tongue  breathing problems  chest pain or palpitations  cough  fever  general ill feeling or flu-like symptoms  signs of worsening heart failure like breathing problems; swelling in your legs and feet Side effects that usually do not require medical attention (report these to your doctor or health care professional if they continue or are bothersome):  bone pain  changes in taste  diarrhea  joint pain  nausea/vomiting  unusually weak or tired  weight loss This list may not describe all possible side effects. Call your doctor for medical advice about side effects. You may report side effects to FDA at 1-800-FDA-1088. Where should I keep my medicine? This drug is given in a hospital or clinic and will not be stored at home. NOTE: This sheet is a summary. It may not cover all possible information. If you have questions about this medicine, talk to your doctor, pharmacist, or health care provider.  2020 Elsevier/Gold Standard (2017-11-01 21:54:17)  Paclitaxel injection What is this medicine? PACLITAXEL (PAK li TAX el) is a chemotherapy drug. It targets fast dividing cells, like cancer cells, and causes these cells to die. This medicine is used to treat ovarian cancer, breast cancer, lung cancer, Kaposi's sarcoma, and other cancers. This medicine may be used for  other purposes; ask your health care provider or pharmacist if you have questions. COMMON BRAND NAME(S): Onxol, Taxol What should I tell my health care provider before I take this medicine? They need to know if you have any of these conditions:  history of irregular heartbeat  liver disease  low blood counts, like low white cell, platelet, or red cell counts  lung or breathing disease, like asthma  tingling of the fingers or toes, or other nerve disorder  an unusual or allergic reaction to paclitaxel, alcohol, polyoxyethylated castor oil, other chemotherapy, other medicines, foods, dyes, or preservatives  pregnant or trying to get pregnant  breast-feeding How should I use this medicine? This drug is given as an infusion into a vein. It is administered in a hospital or clinic by a specially trained health care professional. Talk to your pediatrician regarding the use of this medicine in children. Special care may be needed. Overdosage: If you think you have taken too much of this medicine contact a poison control center or emergency room at once. NOTE: This medicine is only for you. Do not share this medicine with others. What if I miss a dose? It is important not to miss your dose. Call your doctor or health care professional if you are unable to keep an appointment. What may interact with this medicine? Do not  take this medicine with any of the following medications:  disulfiram  metronidazole This medicine may also interact with the following medications:  antiviral medicines for hepatitis, HIV or AIDS  certain antibiotics like erythromycin and clarithromycin  certain medicines for fungal infections like ketoconazole and itraconazole  certain medicines for seizures like carbamazepine, phenobarbital, phenytoin  gemfibrozil  nefazodone  rifampin  St. John's wort This list may not describe all possible interactions. Give your health care provider a list of all the  medicines, herbs, non-prescription drugs, or dietary supplements you use. Also tell them if you smoke, drink alcohol, or use illegal drugs. Some items may interact with your medicine. What should I watch for while using this medicine? Your condition will be monitored carefully while you are receiving this medicine. You will need important blood work done while you are taking this medicine. This medicine can cause serious allergic reactions. To reduce your risk you will need to take other medicine(s) before treatment with this medicine. If you experience allergic reactions like skin rash, itching or hives, swelling of the face, lips, or tongue, tell your doctor or health care professional right away. In some cases, you may be given additional medicines to help with side effects. Follow all directions for their use. This drug may make you feel generally unwell. This is not uncommon, as chemotherapy can affect healthy cells as well as cancer cells. Report any side effects. Continue your course of treatment even though you feel ill unless your doctor tells you to stop. Call your doctor or health care professional for advice if you get a fever, chills or sore throat, or other symptoms of a cold or flu. Do not treat yourself. This drug decreases your body's ability to fight infections. Try to avoid being around people who are sick. This medicine may increase your risk to bruise or bleed. Call your doctor or health care professional if you notice any unusual bleeding. Be careful brushing and flossing your teeth or using a toothpick because you may get an infection or bleed more easily. If you have any dental work done, tell your dentist you are receiving this medicine. Avoid taking products that contain aspirin, acetaminophen, ibuprofen, naproxen, or ketoprofen unless instructed by your doctor. These medicines may hide a fever. Do not become pregnant while taking this medicine. Women should inform their doctor if  they wish to become pregnant or think they might be pregnant. There is a potential for serious side effects to an unborn child. Talk to your health care professional or pharmacist for more information. Do not breast-feed an infant while taking this medicine. Men are advised not to father a child while receiving this medicine. This product may contain alcohol. Ask your pharmacist or healthcare provider if this medicine contains alcohol. Be sure to tell all healthcare providers you are taking this medicine. Certain medicines, like metronidazole and disulfiram, can cause an unpleasant reaction when taken with alcohol. The reaction includes flushing, headache, nausea, vomiting, sweating, and increased thirst. The reaction can last from 30 minutes to several hours. What side effects may I notice from receiving this medicine? Side effects that you should report to your doctor or health care professional as soon as possible:  allergic reactions like skin rash, itching or hives, swelling of the face, lips, or tongue  breathing problems  changes in vision  fast, irregular heartbeat  high or low blood pressure  mouth sores  pain, tingling, numbness in the hands or feet  signs of decreased  platelets or bleeding - bruising, pinpoint red spots on the skin, black, tarry stools, blood in the urine  signs of decreased red blood cells - unusually weak or tired, feeling faint or lightheaded, falls  signs of infection - fever or chills, cough, sore throat, pain or difficulty passing urine  signs and symptoms of liver injury like dark yellow or brown urine; general ill feeling or flu-like symptoms; light-colored stools; loss of appetite; nausea; right upper belly pain; unusually weak or tired; yellowing of the eyes or skin  swelling of the ankles, feet, hands  unusually slow heartbeat Side effects that usually do not require medical attention (report to your doctor or health care professional if they  continue or are bothersome):  diarrhea  hair loss  loss of appetite  muscle or joint pain  nausea, vomiting  pain, redness, or irritation at site where injected  tiredness This list may not describe all possible side effects. Call your doctor for medical advice about side effects. You may report side effects to FDA at 1-800-FDA-1088. Where should I keep my medicine? This drug is given in a hospital or clinic and will not be stored at home. NOTE: This sheet is a summary. It may not cover all possible information. If you have questions about this medicine, talk to your doctor, pharmacist, or health care provider.  2020 Elsevier/Gold Standard (2017-04-16 13:14:55)  Coronavirus (COVID-19) Are you at risk?  Are you at risk for the Coronavirus (COVID-19)?  To be considered HIGH RISK for Coronavirus (COVID-19), you have to meet the following criteria:  . Traveled to [place], [place], [place], [place] or [place]; or in the [place] to [place], [place], [place], or [place]; and have fever, cough, and shortness of breath within the last 2 weeks of travel OR . Been in close contact with a person diagnosed with COVID-19 within the last 2 weeks and have fever, cough, and shortness of breath . IF YOU DO NOT MEET THESE CRITERIA, YOU ARE CONSIDERED LOW RISK FOR COVID-19.  What to do if you are HIGH RISK for COVID-19?  Marland Kitchen If you are having a medical emergency, call 911. . Seek medical care right away. Before you go to a doctor's office, urgent care or emergency department, call ahead and tell them about your recent travel, contact with someone diagnosed with COVID-19, and your symptoms. You should receive instructions from your physician's office regarding next steps of care.  . When you arrive at healthcare provider, tell the healthcare staff immediately you have returned from visiting [place], [place], [place], [place] or [place]; or traveled in the [place] to [place], [place],  [place], or [place]; in the last two weeks or you have been in close contact with a person diagnosed with COVID-19 in the last 2 weeks.   . Tell the health care staff about your symptoms: fever, cough and shortness of breath. . After you have been seen by a medical provider, you will be either: o Tested for (COVID-19) and discharged home on quarantine except to seek medical care if symptoms worsen, and asked to  - Stay home and avoid contact with others until you get your results (4-5 days)  - Avoid travel on public transportation if possible (such as bus, train, or airplane) or o Sent to the Emergency Department by EMS for evaluation, COVID-19 testing, and possible admission depending on your condition and test results.  What to do if you are LOW RISK for COVID-19?  Reduce your risk of any infection by using the same precautions used for avoiding the common cold or flu:  Marland Kitchen Wash your hands often with soap and warm water for at least 20 seconds.  If soap and water are not readily available, use an alcohol-based hand sanitizer with at least 60% alcohol.  . If coughing or sneezing, cover your mouth and nose by coughing or sneezing into the elbow areas of your shirt or coat, into a tissue or into your sleeve (not your hands). . Avoid shaking hands with others and consider head nods or verbal greetings only. . Avoid touching your eyes, nose, or mouth with unwashed hands.  . Avoid close contact with people who are sick. . Avoid places or events with large numbers of people in one location, like concerts or sporting events. . Carefully consider travel plans you have or are making. . If you are planning any travel outside or inside the [place], visit the CDC's Travelers' Health webpage for the latest health notices. . If you have some symptoms but not all symptoms, continue to monitor at home and seek medical attention if your symptoms worsen. . If you are having a medical emergency, call  911.   [facility] / e-Visit: [email or web address]         [facility]: [facility]: [phone]                   [facility]: [phone]

## 2019-04-08 ENCOUNTER — Telehealth: Payer: Self-pay | Admitting: *Deleted

## 2019-04-08 ENCOUNTER — Ambulatory Visit: Payer: Managed Care, Other (non HMO) | Admitting: Rehabilitation

## 2019-04-08 ENCOUNTER — Encounter: Payer: Self-pay | Admitting: Rehabilitation

## 2019-04-08 DIAGNOSIS — R600 Localized edema: Secondary | ICD-10-CM

## 2019-04-08 DIAGNOSIS — Z170 Estrogen receptor positive status [ER+]: Secondary | ICD-10-CM

## 2019-04-08 DIAGNOSIS — C50512 Malignant neoplasm of lower-outer quadrant of left female breast: Secondary | ICD-10-CM | POA: Diagnosis not present

## 2019-04-08 DIAGNOSIS — Z483 Aftercare following surgery for neoplasm: Secondary | ICD-10-CM

## 2019-04-08 DIAGNOSIS — R293 Abnormal posture: Secondary | ICD-10-CM

## 2019-04-08 NOTE — Patient Instructions (Signed)
Access Code: [email or web address]  URL: [email or web address]  Date: 04/08/2019  Prepared by: Shan Levans   Exercises Prone Lower Trapezius with Legs Straight on Swiss Ball - 10 reps                   - 1-3 sets - 1x daily - 7x weekly Half Kneel Row Y - 10 reps - 1-3 sets - 1x daily - 7x weekly Isometric Standing Shoulder External Rotation in Abduction - 10 reps - 1-3 sets - 1x daily - 7x weekly Standing Shoulder Horizontal Abduction with Resistance - 10 reps - 1-3 sets - 1x daily - 7x weekly Standing Shoulder External Rotation with Resistance - 10 reps - 1-3 sets - 1x daily - 7x weekly Tricep Push Up on Wall - 10 reps - 1-3 sets - 1x daily - 7x weekly

## 2019-04-08 NOTE — Therapy (Signed)
[place], [place], [address] Phone: [phone]   Fax:  [phone]  Physical Therapy Treatment  Patient Details  Name: Allison Hanna MRN: [id number] Date of Birth: Nov 01, 1960 Referring Provider (PT): Dr. Fanny Skates   Encounter Date: 04/08/2019  PT End of Session - 04/08/19 1444    Visit Number  5    Number of Visits  10    Date for PT Re-Evaluation  04/23/19    PT Start Time  1400    PT Stop Time  1438    PT Time Calculation (min)  38 min    Activity Tolerance  Patient tolerated treatment well    Behavior During Therapy  [facility] for tasks assessed/performed       Past Medical History:  Diagnosis Date  . Anxiety   . Family history of breast cancer   . Family history of melanoma   . High cholesterol   . Hypothyroid   . Paralyzed vocal cords 2014  . Raynauds syndrome    cold weather triggers fingers to turn white    Past Surgical History:  Procedure Laterality Date  . BREAST LUMPECTOMY WITH RADIOACTIVE SEED AND SENTINEL LYMPH NODE BIOPSY Left 03/05/2019   Procedure: LEFT BREAST LUMPECTOMY WITH RADIOACTIVE SEED AND LEFT AXILLARY DEEP SENTINEL LYMPH NODE BIOPSY INJECT BLUE DYE LEFT BREAST;  Surgeon: Fanny Skates, MD;  Location: [facility];  Service: General;  Laterality: Left;  . KNEE SURGERY Bilateral    arthroscopy for menicus tears  . PORTACATH PLACEMENT Right 03/05/2019   Procedure: INSERTION PORT-A-CATH;  Surgeon: Fanny Skates, MD;  Location: [facility];  Service: General;  Laterality: Right;  . WRIST SURGERY      There were no vitals filed for this visit.  Subjective Assessment - 04/08/19 1407    Subjective  I had my first chemo.  it was good. I have been feeling fine.  Back to tennis as able    Pertinent History  Patient was diagnosed on 02/11/2019 with left invasive ductal carcinoma breast cancer. It measures 8 mm and is located in the lower outer  quadrant. It is ER positive, PR negative, and HER2 positive with a Ki67 of 25%. She is otherwise healthy.Patient underwent a left lumpectomy and SNLB (0/8 nodes positive) on 03/05/2019    Currently in Pain?  No/denies         [facility] PT Assessment - 04/08/19 0001      AROM   Left Shoulder Flexion  175 Degrees    Left Shoulder ABduction  177 Degrees    Left Shoulder Internal Rotation  80 Degrees    Left Shoulder External Rotation  89 Degrees      Strength   Overall Strength Comments  some weakness 4-vs4+ lower trap in supine on the Rt which may be causing shoulder pain with tennis           Katina Dung - 04/08/19 0001    Open a tight or new jar  No difficulty    Do heavy household chores (wash walls, wash floors)  No difficulty    Carry a shopping bag or briefcase  No difficulty    Wash your back  No difficulty    Use a knife to cut food  No difficulty    Recreational activities in which you take some force or impact through your arm, shoulder, or hand (golf, hammering, tennis)  No difficulty    During the past week,  to what extent has your arm, shoulder or hand problem interfered with your normal social activities with family, friends, neighbors, or groups?  Not at all    During the past week, to what extent has your arm, shoulder or hand problem limited your work or other regular daily activities  Not at all    Arm, shoulder, or hand pain.  None    Tingling (pins and needles) in your arm, shoulder, or hand  None    Difficulty Sleeping  No difficulty    DASH Score  0 %             [facility] Adult PT Treatment/Exercise - 04/08/19 0001      Exercises   Other Exercises   finalized HEP after goal and assessment check.  See instruction section today.  All questions answered and some exercises given for the Rt arm for tennis related pain.  Also gave pt green band.                   PT Long Term Goals - 04/08/19 1445      PT LONG TERM GOAL #1   Title  Patient will  demonstrate she has regained full shoulder ROM and function post operatively compared to baselines.    Status  Achieved      PT LONG TERM GOAL #2   Title  Patient will demonstrate she is able to safely demonstrate scar massage to left inferior breast incision.    Status  Achieved      PT LONG TERM GOAL #3   Title  Patient will report >/= 50% less pain in left axillary region to allow her to return to tennis.    Status  Achieved      PT LONG TERM GOAL #4   Title  Improve DASH score to </= 10 for improved overall upper extremity function.    Status  Achieved            Plan - 04/08/19 1444    Clinical Impression Statement  Pt ready for DC today with full ROM and excellent strength.  Gave pt final HEP.  All goals met    PT Frequency  2x / week    PT Duration  4 weeks    PT Treatment/Interventions  ADLs/Self Care Home Management;Therapeutic exercise;Patient/family education;Manual techniques;Manual lymph drainage;Passive range of motion;Scar mobilization    Consulted and Agree with Plan of Care  Patient       Patient will benefit from skilled therapeutic intervention in order to improve the following deficits and impairments:  Pain, Impaired UE functional use, Postural dysfunction, Decreased range of motion, Decreased knowledge of precautions, Increased fascial restricitons, Decreased scar mobility  Visit Diagnosis: 1. Malignant neoplasm of lower-outer quadrant of left breast of female, estrogen receptor positive ([facility])   2. Abnormal posture   3. Localized edema   4. Aftercare following surgery for neoplasm        Problem List Patient Active Problem List   Diagnosis Date Noted  . Genetic testing 02/26/2019  . Family history of breast cancer   . Family history of melanoma   . Malignant neoplasm of lower-outer quadrant of left breast of female, estrogen receptor positive ([place]) 02/17/2019    Stark Bray 04/08/2019, 2:47 PM  [place], [place], [address] Phone: [phone]   Fax:  [phone]  Name: Allison Hanna MRN: [id number] Date of Birth: Feb 15, 1961   PHYSICAL THERAPY  DISCHARGE SUMMARY  Visits from Start of Care: 5  Current functional level related to goals / functional outcomes: All goals met   Remaining deficits: Rt shoulder pain with tennis   Education / Equipment: Final HEP  Plan: Patient agrees to discharge.  Patient goals were met. Patient is being discharged due to meeting the stated rehab goals.  ?????     Shan Levans, PT

## 2019-04-10 ENCOUNTER — Encounter: Payer: Self-pay | Admitting: Physical Therapy

## 2019-04-13 NOTE — Progress Notes (Signed)
[facility]  [phone] [phone]     ID: Allison Hanna DOB: 01-06-1961  MR#: [id number]  XMI#:[id number]  Patient Care Team: Merrilee Seashore, MD as PCP - General (Internal Medicine) Mauro Kaufmann, RN as Oncology Nurse Navigator Rockwell Germany, RN as Oncology Nurse Navigator Hallee Mckenny, Virgie Dad, MD as Consulting Physician (Oncology) Gery Pray, MD as Consulting Physician (Radiation Oncology) Fanny Skates, MD as Consulting Physician (General Surgery) Brien Few, MD as Consulting Physician (Obstetrics and Gynecology) Larey Dresser, MD as Consulting Physician (Cardiology) Feliberto Harts R (Inactive) Chauncey Cruel, MD OTHER MD:   CHIEF COMPLAINT: Estrogen receptor positive breast cancer  CURRENT TREATMENT: Adjuvant chemotherapy   INTERVAL HISTORY: Allison Hanna returns today for follow-up and treatment of her estrogen receptor positive breast cancer.  She continues on adjuvant chemotherapy consisting of paclitaxel and trastuzumab (Ogivri). Today is day 1 cycle 2.   She did remarkably well with her first dose.  She took Compazine the night of treatment next morning and the next night.  She had no other problems with nausea and she did not have any side effects from the Compazine.  She felt slightly tired on day 3.  Nevertheless she walked and did all her usual activities.  Allison Hanna's last echocardiogram on 02/26/2019, showed an ejection fraction in the 60% - 65% range.   Since her last visit here, she has not undergone any additional studies.       REVIEW OF SYSTEMS: Allison Hanna has not begun to lose her hair.  She is planning to get a shortness stenosis start following up.  She already has a wig.  She has been very careful regarding COVID and her children are not allowed to visit.  She is exercising regularly.  She has absolutely no peripheral neuropathy symptoms.  Detailed review of systems today was otherwise stable.   HISTORY OF CURRENT ILLNESS: From the original intake note:  "Allison Hanna" presented with a palpable left breast mass. She initially experienced soreness to her left breast around April 2020 and tried to get a mammogram at this point, but due to pandemic concerns, the scan was delayed.  She did not actually feel a mass in the breast until June 1.  She underwent bilateral diagnostic mammography with tomography and left breast ultrasonography at [facility] on 02/11/2019 showing: breast density category B; irregular hyperechoic mass in the left breast at 4 o'clock 5 cm from the nipple measuring 8 mm; no enlarged adenopathy in the left axilla.  Accordingly on 02/12/2019 she proceeded to biopsy of the left breast area in question. The pathology from this procedure ([id number]) showed: invasive mammary carcinoma, e-cadherin positive. Prognostic indicators significant for: estrogen receptor, 100% positive with strong staining intensity and progesterone receptor, 0% negative. Proliferation marker Ki67 at 25%. HER2 positive by immunohistochemistry, (3+).  The patient's subsequent history is as detailed below.   PAST MEDICAL HISTORY: Past Medical History:  Diagnosis Date  . Anxiety   . Family history of breast cancer   . Family history of melanoma   . High cholesterol   . Hypothyroid   . Paralyzed vocal cords 2014  . Raynauds syndrome    cold weather triggers fingers to turn white  History of palpitations (resolved)   PAST SURGICAL HISTORY: Past Surgical History:  Procedure Laterality Date  . BREAST LUMPECTOMY WITH RADIOACTIVE SEED AND SENTINEL LYMPH NODE BIOPSY Left 03/05/2019   Procedure: LEFT BREAST LUMPECTOMY WITH RADIOACTIVE SEED AND LEFT AXILLARY DEEP SENTINEL LYMPH NODE BIOPSY INJECT  BLUE DYE LEFT BREAST;  Surgeon: Fanny Skates, MD;  Location: [facility];  Service: General;  Laterality: Left;  . KNEE SURGERY Bilateral    arthroscopy for menicus tears  . PORTACATH  PLACEMENT Right 03/05/2019   Procedure: INSERTION PORT-A-CATH;  Surgeon: Fanny Skates, MD;  Location: [facility];  Service: General;  Laterality: Right;  . WRIST SURGERY      FAMILY HISTORY: Family History  Problem Relation Age of Onset  . Breast cancer Mother 92  . Cervical cancer Mother   . Heart attack Father   . Skin cancer Brother   . Breast cancer Paternal Aunt        dx over 24s  . Breast cancer Paternal Aunt        dx over 3  . Uterine cancer Paternal Aunt 52  . Cervical cancer Maternal Aunt   . Liver cancer Paternal Uncle 64  . Stroke Maternal Grandmother   . Parkinson's disease Paternal Grandfather   . Breast cancer Cousin 49       mother's maternal first cousin  . Melanoma Cousin 55       pat first cousin  Patient's father was 64 years old when he died from heart attack. Patient's mother died at age 51. She was diagnosed with breast cancer at age 14. Two paternal aunts were diagnosed with breast cancer at older ages, one of which also had uterine cancer. She has 1 brother. He has a history of skin cancer.    GYNECOLOGIC HISTORY:  No LMP recorded. Patient is postmenopausal. Menarche: 58 years old Age at first live birth: 58 years old [place] P 3 LMP 2007 Contraceptive: yes, pill for about 8 years, no complications HRT no  Hysterectomy? no BSO? 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 She is married. [name] is in charge of operations for [employer]. She lives at home with her husband. [name], age 68, lives in [place], [place] as a Research scientist (life sciences). [name], age 34, lives in [place] as a Careers adviser. [name], age 66, lives in [place] as a Ship broker and works part-time in [employer]. She has no grandchildren.     ADVANCED DIRECTIVES: [name] is automatically her HCPOA.   HEALTH MAINTENANCE: Social History   Tobacco  Use  . Smoking status: Never Smoker  . Smokeless tobacco: Never Used  Substance Use Topics  . Alcohol use: Yes    Alcohol/week: 2.0 standard drinks    Types: 2 Standard drinks or equivalent per week  . Drug use: Never     Colonoscopy: 2010  PAP: 08/2018  Bone density: 2019, osteopenia   Not on File  Current Outpatient Medications  Medication Sig Dispense Refill  . calcium carbonate (OS-CAL) 600 MG TABS tablet Take 1,200 mg by mouth 2 (two) times daily with a meal.    . Cholecalciferol (VITAMIN D3) 25 MCG (1000 UT) CAPS Take 2,000 Units by mouth daily.    . Levothyroxine Sodium (SYNTHROID PO) Take 75 mcg by mouth daily.     Marland Kitchen lidocaine-prilocaine (EMLA) cream Apply to affected area once 30 g 3  . Magnesium 250 MG TABS Take 250 mg by mouth daily.    . prochlorperazine (COMPAZINE) 10 MG tablet Take 1 tablet (10 mg total) by mouth every 6 (six) hours as needed (Nausea or vomiting). 30 tablet 1  . vitamin C (ASCORBIC ACID) 500  MG tablet Take 500 mg by mouth daily.     No current facility-administered medications for this visit.    Facility-Administered Medications Ordered in Other Visits  Medication Dose Route Frequency Provider Last Rate Last Dose  . sodium chloride flush (NS) 0.9 % injection 10 mL  10 mL Intravenous PRN Aoi Kouns, Virgie Dad, MD        OBJECTIVE: Middle-aged white woman in no acute distress  There were no vitals filed for this visit.   There is no height or weight on file to calculate BMI.   Wt Readings from Last 3 Encounters:  04/07/19 159 lb 8 oz (72.3 kg)  03/25/19 158 lb 3.2 oz (71.8 kg)  03/05/19 155 lb 6.8 oz (70.5 kg)   ECOG FS:1 - Symptomatic but completely ambulatory  Sclerae unicteric, EOMs intact Wearing a mask No cervical or supraclavicular adenopathy Lungs no rales or rhonchi Heart regular rate and rhythm Abd soft, nontender, positive bowel sounds MSK no focal spinal tenderness, no upper extremity lymphedema Neuro: nonfocal, well oriented,  appropriate affect Breasts: The right breast is unremarkable.  The left breast is status post lumpectomy.  There is no evidence of active disease.  Both axillae are benign.  LAB RESULTS:  CMP     Component Value Date/Time   NA 134 (L) 04/07/2019 0856   K 4.3 04/07/2019 0856   CL 99 04/07/2019 0856   CO2 28 04/07/2019 0856   GLUCOSE 98 04/07/2019 0856   BUN 15 04/07/2019 0856   CREATININE 0.81 04/07/2019 0856   CREATININE 0.95 02/18/2019 1242   CALCIUM 9.2 04/07/2019 0856   PROT 7.3 04/07/2019 0856   ALBUMIN 4.0 04/07/2019 0856   AST 16 04/07/2019 0856   AST 16 02/18/2019 1242   ALT 13 04/07/2019 0856   ALT 14 02/18/2019 1242   ALKPHOS 39 04/07/2019 0856   BILITOT 0.7 04/07/2019 0856   BILITOT 0.3 02/18/2019 1242   GFRNONAA >60 04/07/2019 0856   GFRNONAA >60 02/18/2019 1242   GFRAA >60 04/07/2019 0856   GFRAA >60 02/18/2019 1242    No results found for: TOTALPROTELP, ALBUMINELP, A1GS, A2GS, BETS, BETA2SER, GAMS, MSPIKE, SPEI  No results found for: KPAFRELGTCHN, LAMBDASER, KAPLAMBRATIO  Lab Results  Component Value Date   WBC 3.9 (L) 04/07/2019   NEUTROABS 2.4 04/07/2019   HGB 11.9 (L) 04/07/2019   HCT 35.6 (L) 04/07/2019   MCV 91.0 04/07/2019   PLT 270 04/07/2019    @LASTCHEMISTRY @  No results found for: LABCA2  No components found for: MPNTIR443  No results for input(s): INR in the last 168 hours.  No results found for: LABCA2  No results found for: XVQ008  No results found for: QPY195  No results found for: KDT267  No results found for: CA2729  No components found for: HGQUANT  No results found for: CEA1 / No results found for: CEA1   No results found for: AFPTUMOR  No results found for: CHROMOGRNA  No results found for: PSA1  No visits with results within 3 Day(s) from this visit.  Latest known visit with results is:  Appointment on 04/07/2019  Component Date Value Ref Range Status  . Sodium 04/07/2019 134* 135 - 145 mmol/L Final  .  Potassium 04/07/2019 4.3  3.5 - 5.1 mmol/L Final  . Chloride 04/07/2019 99  98 - 111 mmol/L Final  . CO2 04/07/2019 28  22 - 32 mmol/L Final  . Glucose, Bld 04/07/2019 98  70 - 99 mg/dL Final  . BUN 04/07/2019  15  6 - 20 mg/dL Final  . Creatinine, Ser 04/07/2019 0.81  0.44 - 1.00 mg/dL Final  . Calcium 04/07/2019 9.2  8.9 - 10.3 mg/dL Final  . Total Protein 04/07/2019 7.3  6.5 - 8.1 g/dL Final  . Albumin 04/07/2019 4.0  3.5 - 5.0 g/dL Final  . AST 04/07/2019 16  15 - 41 U/L Final  . ALT 04/07/2019 13  0 - 44 U/L Final  . Alkaline Phosphatase 04/07/2019 39  38 - 126 U/L Final  . Total Bilirubin 04/07/2019 0.7  0.3 - 1.2 mg/dL Final  . GFR calc non Af Amer 04/07/2019 >60  >60 mL/min Final  . GFR calc Af Amer 04/07/2019 >60  >60 mL/min Final  . Anion gap 04/07/2019 7  5 - 15 Final   Performed at [facility], [place] [address] [place], [place] [address]  . WBC 04/07/2019 3.9* 4.0 - 10.5 K/uL Final  . RBC 04/07/2019 3.91  3.87 - 5.11 MIL/uL Final  . Hemoglobin 04/07/2019 11.9* 12.0 - 15.0 g/dL Final  . HCT 04/07/2019 35.6* 36.0 - 46.0 % Final  . MCV 04/07/2019 91.0  80.0 - 100.0 fL Final  . MCH 04/07/2019 30.4  26.0 - 34.0 pg Final  . MCHC 04/07/2019 33.4  30.0 - 36.0 g/dL Final  . RDW 04/07/2019 12.0  11.5 - 15.5 % Final  . Platelets 04/07/2019 270  150 - 400 K/uL Final  . nRBC 04/07/2019 0.0  0.0 - 0.2 % Final  . Neutrophils Relative % 04/07/2019 62  % Final  . Neutro Abs 04/07/2019 2.4  1.7 - 7.7 K/uL Final  . Lymphocytes Relative 04/07/2019 23  % Final  . Lymphs Abs 04/07/2019 0.9  0.7 - 4.0 K/uL Final  . Monocytes Relative 04/07/2019 8  % Final  . Monocytes Absolute 04/07/2019 0.3  0.1 - 1.0 K/uL Final  . Eosinophils Relative 04/07/2019 4  % Final  . Eosinophils Absolute 04/07/2019 0.2  0.0 - 0.5 K/uL Final  . Basophils Relative 04/07/2019 2  % Final  . Basophils Absolute 04/07/2019 0.1  0.0 - 0.1 K/uL Final  . Immature Granulocytes 04/07/2019 1  % Final  . Abs  Immature Granulocytes 04/07/2019 0.02  0.00 - 0.07 K/uL Final   Performed at [facility], [place] [address] [place], [place] [address]    (this displays the last labs from the last 3 days)  No results found for: TOTALPROTELP, ALBUMINELP, A1GS, A2GS, BETS, BETA2SER, GAMS, MSPIKE, SPEI (this displays SPEP labs)  No results found for: KPAFRELGTCHN, LAMBDASER, KAPLAMBRATIO (kappa/lambda light chains)  No results found for: HGBA, HGBA2QUANT, HGBFQUANT, HGBSQUAN (Hemoglobinopathy evaluation)   No results found for: LDH  No results found for: IRON, TIBC, IRONPCTSAT (Iron and TIBC)  No results found for: FERRITIN  Urinalysis No results found for: COLORURINE, APPEARANCEUR, LABSPEC, PHURINE, GLUCOSEU, HGBUR, BILIRUBINUR, KETONESUR, PROTEINUR, UROBILINOGEN, NITRITE, LEUKOCYTESUR   STUDIES: No results found.  ELIGIBLE FOR AVAILABLE RESEARCH PROTOCOL: no  ASSESSMENT: 58 y.o. [place] woman status post left breast biopsy 02/12/2019 for a clinical T1b N0, stage IA invasive ductal carcinoma, estrogen receptor positive, progesterone receptor negative, HER-2 amplified, with an MIB-1-1 of 25%  (1) status post left lumpectomy and sentinel lymph node sampling 03/05/2019 for a pT1c pN0, stage IA invasive ductal carcinoma, grade 2, with positive lymphovascular invasion but negative margins  (a) a total of 8 lymph nodes removed (4 sentinel)   (2) adjuvant chemotherapy with paclitaxel and trastuzumab weekly x12 started 04/07/2019  (  3) trastuzumab to be continued to complete 1 year  (a) echo 02/26/2019 shows an ejection fraction in the 60-65% range  (4) adjuvant radiation to follow  (5) antiestrogens to start at the completion of local treatment  (6) genetics testing 02/25/2019 through the Multi-Gene Panel offered by [facility] found no deleterious mutations in AIP, ALK, APC, ATM, AXIN2,BAP1,  BARD1, BLM, BMPR1A, BRCA1, BRCA2, BRIP1, CASR, CDC73, CDH1, CDK4, CDKN1B, CDKN1C,  CDKN2A (p14ARF), CDKN2A (p16INK4a), CEBPA, CHEK2, CTNNA1, DICER1, DIS3L2, EGFR (c.2369C>T, p.Thr790Met variant only), EPCAM (Deletion/duplication testing only), FH, FLCN, GATA2, GPC3, GREM1 (Promoter region deletion/duplication testing only), HOXB13 (c.251G>A, p.Gly84Glu), HRAS, KIT, MAX, MEN1, MET, MITF (c.952G>A, p.Glu318Lys variant only), MLH1, MSH2, MSH3, MSH6, MUTYH, NBN, NF1, NF2, NTHL1, PALB2, PDGFRA, PHOX2B, PMS2, POLD1, POLE, POT1, PRKAR1A, PTCH1, PTEN, RAD50, RAD51C, RAD51D, RB1, RECQL4, RET, RNF43, RUNX1, SDHAF2, SDHA (sequence changes only), SDHB, SDHC, SDHD, SMAD4, SMARCA4, SMARCB1, SMARCE1, STK11, SUFU, TERC, TERT, TMEM127, TP53, TSC1, TSC2, VHL, WRN and WT1.      PLAN: Allison Hanna tolerated her first cycle of Taxol and Herceptin with no side effects of consequence.  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  I am going to go ahead and start her on Valtrex to suppress any viral issues.  She does have a history of shingles in the past.  I encouraged her to get the flu vaccine as soon as it becomes available here.  She will continue weekly treatments.  She will see me again in 3 weeks.  She knows to call for any other issue that may develop before her next visit.  Chauncey Cruel, MD   04/14/2019 12:33 PM Medical Oncology and Hematology [facility] [address] [place], [place] [address] Tel. [phone]    Fax. [phone]  I, Jacqualyn Posey am acting as a Education administrator for Chauncey Cruel, MD.   I, [name] MD, have reviewed the above documentation for accuracy and completeness, and I agree with the above.

## 2019-04-14 ENCOUNTER — Inpatient Hospital Stay: Payer: Managed Care, Other (non HMO)

## 2019-04-14 ENCOUNTER — Other Ambulatory Visit: Payer: Self-pay

## 2019-04-14 ENCOUNTER — Inpatient Hospital Stay (HOSPITAL_BASED_OUTPATIENT_CLINIC_OR_DEPARTMENT_OTHER): Payer: Managed Care, Other (non HMO) | Admitting: Oncology

## 2019-04-14 VITALS — BP 149/85 | HR 69 | Temp 98.2°F | Resp 18 | Ht 66.0 in | Wt 159.8 lb

## 2019-04-14 DIAGNOSIS — Z170 Estrogen receptor positive status [ER+]: Secondary | ICD-10-CM

## 2019-04-14 DIAGNOSIS — Z95828 Presence of other vascular implants and grafts: Secondary | ICD-10-CM

## 2019-04-14 DIAGNOSIS — C50512 Malignant neoplasm of lower-outer quadrant of left female breast: Secondary | ICD-10-CM

## 2019-04-14 DIAGNOSIS — Z5112 Encounter for antineoplastic immunotherapy: Secondary | ICD-10-CM | POA: Diagnosis not present

## 2019-04-14 LAB — CBC WITH DIFFERENTIAL/PLATELET
Abs Immature Granulocytes: 0.03 10*3/uL (ref 0.00–0.07)
Basophils Absolute: 0.1 10*3/uL (ref 0.0–0.1)
Basophils Relative: 1 %
Eosinophils Absolute: 0.1 10*3/uL (ref 0.0–0.5)
Eosinophils Relative: 2 %
HCT: 32.6 % — ABNORMAL LOW (ref 36.0–46.0)
Hemoglobin: 11 g/dL — ABNORMAL LOW (ref 12.0–15.0)
Immature Granulocytes: 1 %
Lymphocytes Relative: 21 %
Lymphs Abs: 0.9 10*3/uL (ref 0.7–4.0)
MCH: 30.6 pg (ref 26.0–34.0)
MCHC: 33.7 g/dL (ref 30.0–36.0)
MCV: 90.6 fL (ref 80.0–100.0)
Monocytes Absolute: 0.2 10*3/uL (ref 0.1–1.0)
Monocytes Relative: 5 %
Neutro Abs: 3.2 10*3/uL (ref 1.7–7.7)
Neutrophils Relative %: 70 %
Platelets: 261 10*3/uL (ref 150–400)
RBC: 3.6 MIL/uL — ABNORMAL LOW (ref 3.87–5.11)
RDW: 11.9 % (ref 11.5–15.5)
WBC: 4.5 10*3/uL (ref 4.0–10.5)
nRBC: 0 % (ref 0.0–0.2)

## 2019-04-14 LAB — COMPREHENSIVE METABOLIC PANEL
ALT: 14 U/L (ref 0–44)
AST: 16 U/L (ref 15–41)
Albumin: 3.8 g/dL (ref 3.5–5.0)
Alkaline Phosphatase: 43 U/L (ref 38–126)
Anion gap: 9 (ref 5–15)
BUN: 19 mg/dL (ref 6–20)
CO2: 25 mmol/L (ref 22–32)
Calcium: 8.7 mg/dL — ABNORMAL LOW (ref 8.9–10.3)
Chloride: 97 mmol/L — ABNORMAL LOW (ref 98–111)
Creatinine, Ser: 0.87 mg/dL (ref 0.44–1.00)
GFR calc Af Amer: >60 mL/min (ref >60)
GFR calc non Af Amer: >60 mL/min (ref >60)
Glucose, Bld: 95 mg/dL (ref 70–99)
Potassium: 4.1 mmol/L (ref 3.5–5.1)
Sodium: 131 mmol/L — ABNORMAL LOW (ref 135–145)
Total Bilirubin: 0.4 mg/dL (ref 0.3–1.2)
Total Protein: 6.8 g/dL (ref 6.5–8.1)

## 2019-04-14 MED ORDER — DEXAMETHASONE SODIUM PHOSPHATE 10 MG/ML IJ SOLN
INTRAMUSCULAR | Status: AC
  Filled 2019-04-14: qty 1

## 2019-04-14 MED ORDER — ACETAMINOPHEN 325 MG PO TABS
650.0000 mg | ORAL_TABLET | Freq: Once | ORAL | Status: AC
  Administered 2019-04-14: 14:00:00 650 mg via ORAL

## 2019-04-14 MED ORDER — FAMOTIDINE IN NACL 20-0.9 MG/50ML-% IV SOLN
20.0000 mg | Freq: Once | INTRAVENOUS | Status: AC
  Administered 2019-04-14: 14:00:00 20 mg via INTRAVENOUS

## 2019-04-14 MED ORDER — SODIUM CHLORIDE 0.9 % IV SOLN
Freq: Once | INTRAVENOUS | Status: AC
  Administered 2019-04-14: 14:00:00 via INTRAVENOUS
  Filled 2019-04-14: qty 250

## 2019-04-14 MED ORDER — DEXAMETHASONE SODIUM PHOSPHATE 10 MG/ML IJ SOLN
10.0000 mg | Freq: Once | INTRAMUSCULAR | Status: AC
  Administered 2019-04-14: 14:00:00 10 mg via INTRAVENOUS

## 2019-04-14 MED ORDER — SODIUM CHLORIDE 0.9% FLUSH
10.0000 mL | INTRAVENOUS | Status: DC | PRN
  Administered 2019-04-14: 13:00:00 10 mL via INTRAVENOUS
  Filled 2019-04-14: qty 10

## 2019-04-14 MED ORDER — HEPARIN SOD (PORK) LOCK FLUSH 100 UNIT/ML IV SOLN
500.0000 [IU] | Freq: Once | INTRAVENOUS | Status: AC | PRN
  Administered 2019-04-14: 500 [IU]
  Filled 2019-04-14: qty 5

## 2019-04-14 MED ORDER — SODIUM CHLORIDE 0.9% FLUSH
10.0000 mL | INTRAVENOUS | Status: DC | PRN
  Administered 2019-04-14: 10 mL
  Filled 2019-04-14: qty 10

## 2019-04-14 MED ORDER — SODIUM CHLORIDE 0.9 % IV SOLN
80.0000 mg/m2 | Freq: Once | INTRAVENOUS | Status: AC
  Administered 2019-04-14: 16:00:00 150 mg via INTRAVENOUS
  Filled 2019-04-14: qty 25

## 2019-04-14 MED ORDER — DIPHENHYDRAMINE HCL 50 MG/ML IJ SOLN
25.0000 mg | Freq: Once | INTRAMUSCULAR | Status: AC
  Administered 2019-04-14: 14:00:00 25 mg via INTRAVENOUS

## 2019-04-14 MED ORDER — VALACYCLOVIR HCL 500 MG PO TABS: 500.0000 mg | ORAL_TABLET | Freq: Two times a day (BID) | ORAL | 1 refills | Status: DC

## 2019-04-14 MED ORDER — DIPHENHYDRAMINE HCL 50 MG/ML IJ SOLN
INTRAMUSCULAR | Status: AC
  Filled 2019-04-14: qty 1

## 2019-04-14 MED ORDER — FAMOTIDINE IN NACL 20-0.9 MG/50ML-% IV SOLN
INTRAVENOUS | Status: AC
  Filled 2019-04-14: qty 50

## 2019-04-14 MED ORDER — ACETAMINOPHEN 325 MG PO TABS
ORAL_TABLET | ORAL | Status: AC
  Filled 2019-04-14: qty 2

## 2019-04-14 MED ORDER — TRASTUZUMAB-DKST CHEMO 150 MG IV SOLR
150.0000 mg | Freq: Once | INTRAVENOUS | Status: AC
  Administered 2019-04-14: 15:00:00 150 mg via INTRAVENOUS
  Filled 2019-04-14: qty 7.14

## 2019-04-14 NOTE — Patient Instructions (Signed)
[facility] Discharge Instructions for Patients Receiving Chemotherapy  Today you received the following chemotherapy agents Taxol and Immunotherapy agent: Trastuzumab-xxxx  To help prevent nausea and vomiting after your treatment, we encourage you to take your nausea medication as directed by your MD.   If you develop nausea and vomiting that is not controlled by your nausea medication, call the clinic.   BELOW ARE SYMPTOMS THAT SHOULD BE REPORTED IMMEDIATELY:  *FEVER GREATER THAN 100.5 F  *CHILLS WITH OR WITHOUT FEVER  NAUSEA AND VOMITING THAT IS NOT CONTROLLED WITH YOUR NAUSEA MEDICATION  *UNUSUAL SHORTNESS OF BREATH  *UNUSUAL BRUISING OR BLEEDING  TENDERNESS IN MOUTH AND THROAT WITH OR WITHOUT PRESENCE OF ULCERS  *URINARY PROBLEMS  *BOWEL PROBLEMS  UNUSUAL RASH Items with * indicate a potential emergency and should be followed up as soon as possible.  Feel free to call the clinic should you have any questions or concerns. The clinic phone number is [phone].  Please show the [facility] at check-in to the Emergency Department and triage nurse.

## 2019-04-21 ENCOUNTER — Inpatient Hospital Stay (HOSPITAL_BASED_OUTPATIENT_CLINIC_OR_DEPARTMENT_OTHER): Payer: Managed Care, Other (non HMO) | Admitting: Adult Health

## 2019-04-21 ENCOUNTER — Other Ambulatory Visit: Payer: Self-pay

## 2019-04-21 ENCOUNTER — Inpatient Hospital Stay: Payer: Managed Care, Other (non HMO)

## 2019-04-21 ENCOUNTER — Encounter: Payer: Self-pay | Admitting: Adult Health

## 2019-04-21 ENCOUNTER — Telehealth: Payer: Self-pay | Admitting: *Deleted

## 2019-04-21 VITALS — BP 151/75 | HR 67 | Temp 98.2°F | Resp 18 | Ht 66.0 in | Wt 158.2 lb

## 2019-04-21 DIAGNOSIS — C50512 Malignant neoplasm of lower-outer quadrant of left female breast: Secondary | ICD-10-CM | POA: Diagnosis not present

## 2019-04-21 DIAGNOSIS — Z170 Estrogen receptor positive status [ER+]: Secondary | ICD-10-CM

## 2019-04-21 DIAGNOSIS — Z5112 Encounter for antineoplastic immunotherapy: Secondary | ICD-10-CM | POA: Diagnosis not present

## 2019-04-21 DIAGNOSIS — Z95828 Presence of other vascular implants and grafts: Secondary | ICD-10-CM | POA: Insufficient documentation

## 2019-04-21 LAB — CBC WITH DIFFERENTIAL/PLATELET
Abs Immature Granulocytes: 0.02 10*3/uL (ref 0.00–0.07)
Basophils Absolute: 0.1 10*3/uL (ref 0.0–0.1)
Basophils Relative: 2 %
Eosinophils Absolute: 0.1 10*3/uL (ref 0.0–0.5)
Eosinophils Relative: 2 %
HCT: 32.3 % — ABNORMAL LOW (ref 36.0–46.0)
Hemoglobin: 11 g/dL — ABNORMAL LOW (ref 12.0–15.0)
Immature Granulocytes: 1 %
Lymphocytes Relative: 25 %
Lymphs Abs: 0.9 10*3/uL (ref 0.7–4.0)
MCH: 30.8 pg (ref 26.0–34.0)
MCHC: 34.1 g/dL (ref 30.0–36.0)
MCV: 90.5 fL (ref 80.0–100.0)
Monocytes Absolute: 0.3 10*3/uL (ref 0.1–1.0)
Monocytes Relative: 8 %
Neutro Abs: 2.2 10*3/uL (ref 1.7–7.7)
Neutrophils Relative %: 62 %
Platelets: 313 10*3/uL (ref 150–400)
RBC: 3.57 MIL/uL — ABNORMAL LOW (ref 3.87–5.11)
RDW: 12.1 % (ref 11.5–15.5)
WBC: 3.4 10*3/uL — ABNORMAL LOW (ref 4.0–10.5)
nRBC: 0 % (ref 0.0–0.2)

## 2019-04-21 LAB — COMPREHENSIVE METABOLIC PANEL
ALT: 16 U/L (ref 0–44)
AST: 17 U/L (ref 15–41)
Albumin: 3.8 g/dL (ref 3.5–5.0)
Alkaline Phosphatase: 45 U/L (ref 38–126)
Anion gap: 9 (ref 5–15)
BUN: 14 mg/dL (ref 6–20)
CO2: 24 mmol/L (ref 22–32)
Calcium: 8.7 mg/dL — ABNORMAL LOW (ref 8.9–10.3)
Chloride: 96 mmol/L — ABNORMAL LOW (ref 98–111)
Creatinine, Ser: 0.78 mg/dL (ref 0.44–1.00)
GFR calc Af Amer: >60 mL/min (ref >60)
GFR calc non Af Amer: >60 mL/min (ref >60)
Glucose, Bld: 91 mg/dL (ref 70–99)
Potassium: 4.2 mmol/L (ref 3.5–5.1)
Sodium: 129 mmol/L — ABNORMAL LOW (ref 135–145)
Total Bilirubin: 0.3 mg/dL (ref 0.3–1.2)
Total Protein: 6.9 g/dL (ref 6.5–8.1)

## 2019-04-21 MED ORDER — HEPARIN SOD (PORK) LOCK FLUSH 100 UNIT/ML IV SOLN
500.0000 [IU] | Freq: Once | INTRAVENOUS | Status: AC | PRN
  Administered 2019-04-21: 500 [IU]
  Filled 2019-04-21: qty 5

## 2019-04-21 MED ORDER — SODIUM CHLORIDE 0.9 % IV SOLN
80.0000 mg/m2 | Freq: Once | INTRAVENOUS | Status: AC
  Administered 2019-04-21: 16:00:00 150 mg via INTRAVENOUS
  Filled 2019-04-21: qty 25

## 2019-04-21 MED ORDER — FAMOTIDINE IN NACL 20-0.9 MG/50ML-% IV SOLN
INTRAVENOUS | Status: AC
  Filled 2019-04-21: qty 50

## 2019-04-21 MED ORDER — FAMOTIDINE IN NACL 20-0.9 MG/50ML-% IV SOLN
20.0000 mg | Freq: Once | INTRAVENOUS | Status: AC
  Administered 2019-04-21: 14:00:00 20 mg via INTRAVENOUS

## 2019-04-21 MED ORDER — TRASTUZUMAB-DKST CHEMO 150 MG IV SOLR
150.0000 mg | Freq: Once | INTRAVENOUS | Status: AC
  Administered 2019-04-21: 150 mg via INTRAVENOUS
  Filled 2019-04-21: qty 7.14

## 2019-04-21 MED ORDER — ACETAMINOPHEN 325 MG PO TABS
ORAL_TABLET | ORAL | Status: AC
  Filled 2019-04-21: qty 2

## 2019-04-21 MED ORDER — SODIUM CHLORIDE 0.9% FLUSH
10.0000 mL | INTRAVENOUS | Status: DC | PRN
  Administered 2019-04-21: 10 mL
  Filled 2019-04-21: qty 10

## 2019-04-21 MED ORDER — SODIUM CHLORIDE 0.9 % IV SOLN
Freq: Once | INTRAVENOUS | Status: AC
  Administered 2019-04-21: 14:00:00 via INTRAVENOUS
  Filled 2019-04-21: qty 250

## 2019-04-21 MED ORDER — DEXAMETHASONE SODIUM PHOSPHATE 10 MG/ML IJ SOLN
INTRAMUSCULAR | Status: AC
  Filled 2019-04-21: qty 1

## 2019-04-21 MED ORDER — DEXAMETHASONE SODIUM PHOSPHATE 10 MG/ML IJ SOLN
4.0000 mg | Freq: Once | INTRAMUSCULAR | Status: AC
  Administered 2019-04-21: 14:00:00 4 mg via INTRAVENOUS

## 2019-04-21 MED ORDER — SODIUM CHLORIDE 0.9% FLUSH
10.0000 mL | INTRAVENOUS | Status: DC | PRN
  Administered 2019-04-21: 17:00:00 10 mL
  Filled 2019-04-21: qty 10

## 2019-04-21 MED ORDER — ACETAMINOPHEN 325 MG PO TABS
650.0000 mg | ORAL_TABLET | Freq: Once | ORAL | Status: AC
  Administered 2019-04-21: 14:00:00 650 mg via ORAL

## 2019-04-21 MED ORDER — DIPHENHYDRAMINE HCL 50 MG/ML IJ SOLN
25.0000 mg | Freq: Once | INTRAMUSCULAR | Status: AC
  Administered 2019-04-21: 14:00:00 25 mg via INTRAVENOUS

## 2019-04-21 MED ORDER — DIPHENHYDRAMINE HCL 50 MG/ML IJ SOLN
INTRAMUSCULAR | Status: AC
  Filled 2019-04-21: qty 1

## 2019-04-21 NOTE — Telephone Encounter (Signed)
Called pt to assess needs during chemo. Relate doing well. Walking around 5 miles a 3 times a week. No n/v. Encourage pt to call with questions or needs. Received verbal understanding

## 2019-04-21 NOTE — Progress Notes (Signed)
[facility]  [phone] [phone]     ID: Allison Hanna DOB: 08-Mar-1961  MR#: [id number]  FXJ#:[id number]  Patient Care Team: Merrilee Seashore, MD as PCP - General (Internal Medicine) Mauro Kaufmann, RN as Oncology Nurse Navigator Rockwell Germany, RN as Oncology Nurse Navigator Magrinat, Virgie Dad, MD as Consulting Physician (Oncology) Gery Pray, MD as Consulting Physician (Radiation Oncology) Fanny Skates, MD as Consulting Physician (General Surgery) Brien Few, MD as Consulting Physician (Obstetrics and Gynecology) Larey Dresser, MD as Consulting Physician (Cardiology) Feliberto Harts R (Inactive) Scot Dock, NP OTHER MD:   CHIEF COMPLAINT: Estrogen receptor positive breast cancer  CURRENT TREATMENT: Adjuvant chemotherapy   INTERVAL HISTORY: [name] returns today for follow-up and treatment of her estrogen receptor positive breast cancer.  She continues on adjuvant chemotherapy consisting of paclitaxel and trastuzumab ([place]). Today is day 1 cycle 3.   Allison Hanna's last echocardiogram on 02/26/2019, showed an ejection fraction in the 60% - 65% range.    REVIEW OF SYSTEMS: Dereonna notes she is experiencing burning diarrhea. She says she notes it happens when she goes to the bathroom.  No urgency or stomach pain with this.  This is mild and she says it is tolerable, and not to the point where she would need imodium.  She continues to have two oral ulcers and she was started on Valtrex BID for this.  She is taking this with good tolerance and it is improving.    Allison Hanna gets tired at the end of the day.  She remains active and is walking 2-5 miles per day.  She is practicing cryotherapy during her treatments.  She has no peripheral neuropathy at this point.    Annaly denies any fever or chills.  She is without nausea, vomiting, or bladder concerns.  She has no unusual headaches or vision changes.  She has no swelling in  either arm or ROM concern.  She has no pain, cough, shortness of breath, or palpitations.  A detailed ROS was otherwise non contributory.    HISTORY OF CURRENT ILLNESS: From the original intake note:  "Allison Hanna" presented with a palpable left breast mass. She initially experienced soreness to her left breast around April 2020 and tried to get a mammogram at this point, but due to pandemic concerns, the scan was delayed.  She did not actually feel a mass in the breast until June 1.  She underwent bilateral diagnostic mammography with tomography and left breast ultrasonography at [facility] on 02/11/2019 showing: breast density category B; irregular hyperechoic mass in the left breast at 4 o'clock 5 cm from the nipple measuring 8 mm; no enlarged adenopathy in the left axilla.  Accordingly on 02/12/2019 she proceeded to biopsy of the left breast area in question. The pathology from this procedure ([id number]) showed: invasive mammary carcinoma, e-cadherin positive. Prognostic indicators significant for: estrogen receptor, 100% positive with strong staining intensity and progesterone receptor, 0% negative. Proliferation marker Ki67 at 25%. HER2 positive by immunohistochemistry, (3+).  The patient's subsequent history is as detailed below.   PAST MEDICAL HISTORY: Past Medical History:  Diagnosis Date   Anxiety    Family history of breast cancer    Family history of melanoma    High cholesterol    Hypothyroid    Paralyzed vocal cords 2014   Raynauds syndrome    cold weather triggers fingers to turn white  History of palpitations (resolved)   PAST SURGICAL HISTORY: Past Surgical History:  Procedure  Laterality Date   BREAST LUMPECTOMY WITH RADIOACTIVE SEED AND SENTINEL LYMPH NODE BIOPSY Left 03/05/2019   Procedure: LEFT BREAST LUMPECTOMY WITH RADIOACTIVE SEED AND LEFT AXILLARY DEEP SENTINEL LYMPH NODE BIOPSY INJECT BLUE DYE LEFT BREAST;  Surgeon: Fanny Skates, MD;  Location:  [facility];  Service: General;  Laterality: Left;   KNEE SURGERY Bilateral    arthroscopy for menicus tears   PORTACATH PLACEMENT Right 03/05/2019   Procedure: INSERTION PORT-A-CATH;  Surgeon: Fanny Skates, MD;  Location: [facility];  Service: General;  Laterality: Right;   WRIST SURGERY      FAMILY HISTORY: Family History  Problem Relation Age of Onset   Breast cancer Mother 15   Cervical cancer Mother    Heart attack Father    Skin cancer Brother    Breast cancer Paternal Aunt        dx over 24s   Breast cancer Paternal Aunt        dx over 62   Uterine cancer Paternal Aunt 86   Cervical cancer Maternal Aunt    Liver cancer Paternal Uncle 8   Stroke Maternal Grandmother    Parkinson's disease Paternal Grandfather    Breast cancer Cousin 46       mother's maternal first cousin   Melanoma Cousin 36       pat first cousin  Patient's father was 17 years old when he died from heart attack. Patient's mother died at age 60. She was diagnosed with breast cancer at age 67. Two paternal aunts were diagnosed with breast cancer at older ages, one of which also had uterine cancer. She has 1 brother. He has a history of skin cancer.    GYNECOLOGIC HISTORY:  No LMP recorded. Patient is postmenopausal. Menarche: 58 years old Age at first live birth: 58 years old Jardine P 3 LMP 2007 Contraceptive: yes, pill for about 8 years, no complications HRT no  Hysterectomy? no BSO? 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 She is married. [name] is in charge of operations for [employer]. She lives at home with her husband. Daughter Allison Hanna, age 60, lives in [place], [place] as a Research scientist (life sciences). Daughter Allison Hanna, age 73, lives in [place] as a Careers adviser. Son Allison Hanna, age 44, lives in [place] as a Ship broker and works part-time in [employer]. She has no  grandchildren.     ADVANCED DIRECTIVES: [name] is automatically her HCPOA.   HEALTH MAINTENANCE: Social History   Tobacco Use   Smoking status: Never Smoker   Smokeless tobacco: Never Used  Substance Use Topics   Alcohol use: Yes    Alcohol/week: 2.0 standard drinks    Types: 2 Standard drinks or equivalent per week   Drug use: Never     Colonoscopy: 2010  PAP: 08/2018  Bone density: 2019, osteopenia   No Known Allergies  Current Outpatient Medications  Medication Sig Dispense Refill   calcium carbonate (OS-CAL) 600 MG TABS tablet Take 1,200 mg by mouth 2 (two) times daily with a meal.     Cholecalciferol (VITAMIN D3) 25 MCG (1000 UT) CAPS Take 2,000 Units by mouth daily.     Levothyroxine Sodium (SYNTHROID PO) Take 75 mcg by mouth daily.      lidocaine-prilocaine (EMLA) cream Apply to affected area once 30 g 3   Magnesium 250 MG TABS Take 250 mg by mouth daily.  prochlorperazine (COMPAZINE) 10 MG tablet Take 1 tablet (10 mg total) by mouth every 6 (six) hours as needed (Nausea or vomiting). 30 tablet 1   valACYclovir (VALTREX) 500 MG tablet Take 1 tablet (500 mg total) by mouth 2 (two) times daily. 60 tablet 1   vitamin C (ASCORBIC ACID) 500 MG tablet Take 500 mg by mouth daily.     No current facility-administered medications for this visit.     OBJECTIVE: Vitals:   04/21/19 1304  BP: (!) 151/75  Pulse: 67  Resp: 18  Temp: 98.2 F (36.8 C)  SpO2: 100%    Body mass index is 25.53 kg/m.   Wt Readings from Last 3 Encounters:  04/21/19 158 lb 3.2 oz (71.8 kg)  04/14/19 159 lb 12.8 oz (72.5 kg)  04/07/19 159 lb 8 oz (72.3 kg)   ECOG FS:1 - Symptomatic but completely ambulatory GENERAL: Patient is a well appearing female in no acute distress HEENT:  Sclerae anicteric.  Oropharynx clear and moist. No ulcerations or evidence of oropharyngeal candidiasis. Neck is supple.  NODES:  No cervical, supraclavicular, or axillary lymphadenopathy  palpated.  BREAST EXAM:  Deferred. LUNGS:  Clear to auscultation bilaterally.  No wheezes or rhonchi. HEART:  Regular rate and rhythm. No murmur appreciated. ABDOMEN:  Soft, nontender.  Positive, normoactive bowel sounds. No organomegaly palpated. MSK:  No focal spinal tenderness to palpation. Full range of motion bilaterally in the upper extremities. EXTREMITIES:  No peripheral edema.   SKIN:  Clear with no obvious rashes or skin changes. No nail dyscrasia. NEURO:  Nonfocal. Well oriented.  Appropriate affect.    LAB RESULTS:  CMP     Component Value Date/Time   NA 131 (L) 04/14/2019 1213   K 4.1 04/14/2019 1213   CL 97 (L) 04/14/2019 1213   CO2 25 04/14/2019 1213   GLUCOSE 95 04/14/2019 1213   BUN 19 04/14/2019 1213   CREATININE 0.87 04/14/2019 1213   CREATININE 0.95 02/18/2019 1242   CALCIUM 8.7 (L) 04/14/2019 1213   PROT 6.8 04/14/2019 1213   ALBUMIN 3.8 04/14/2019 1213   AST 16 04/14/2019 1213   AST 16 02/18/2019 1242   ALT 14 04/14/2019 1213   ALT 14 02/18/2019 1242   ALKPHOS 43 04/14/2019 1213   BILITOT 0.4 04/14/2019 1213   BILITOT 0.3 02/18/2019 1242   GFRNONAA >60 04/14/2019 1213   GFRNONAA >60 02/18/2019 1242   GFRAA >60 04/14/2019 1213   GFRAA >60 02/18/2019 1242    No results found for: TOTALPROTELP, ALBUMINELP, A1GS, A2GS, BETS, BETA2SER, GAMS, MSPIKE, SPEI  No results found for: KPAFRELGTCHN, LAMBDASER, KAPLAMBRATIO  Lab Results  Component Value Date   WBC 3.4 (L) 04/21/2019   NEUTROABS 2.2 04/21/2019   HGB 11.0 (L) 04/21/2019   HCT 32.3 (L) 04/21/2019   MCV 90.5 04/21/2019   PLT 313 04/21/2019    @LASTCHEMISTRY @  No results found for: LABCA2  No components found for: UYQIHK742  No results for input(s): INR in the last 168 hours.  No results found for: LABCA2  No results found for: VZD638  No results found for: VFI433  No results found for: IRJ188  No results found for: CA2729  No components found for: HGQUANT  No results found  for: CEA1 / No results found for: CEA1   No results found for: AFPTUMOR  No results found for: CHROMOGRNA  No results found for: PSA1  Appointment on 04/21/2019  Component Date Value Ref Range Status   WBC 04/21/2019 3.4* 4.0 -  10.5 K/uL Final   RBC 04/21/2019 3.57* 3.87 - 5.11 MIL/uL Final   Hemoglobin 04/21/2019 11.0* 12.0 - 15.0 g/dL Final   HCT 04/21/2019 32.3* 36.0 - 46.0 % Final   MCV 04/21/2019 90.5  80.0 - 100.0 fL Final   MCH 04/21/2019 30.8  26.0 - 34.0 pg Final   MCHC 04/21/2019 34.1  30.0 - 36.0 g/dL Final   RDW 04/21/2019 12.1  11.5 - 15.5 % Final   Platelets 04/21/2019 313  150 - 400 K/uL Final   nRBC 04/21/2019 0.0  0.0 - 0.2 % Final   Neutrophils Relative % 04/21/2019 62  % Final   Neutro Abs 04/21/2019 2.2  1.7 - 7.7 K/uL Final   Lymphocytes Relative 04/21/2019 25  % Final   Lymphs Abs 04/21/2019 0.9  0.7 - 4.0 K/uL Final   Monocytes Relative 04/21/2019 8  % Final   Monocytes Absolute 04/21/2019 0.3  0.1 - 1.0 K/uL Final   Eosinophils Relative 04/21/2019 2  % Final   Eosinophils Absolute 04/21/2019 0.1  0.0 - 0.5 K/uL Final   Basophils Relative 04/21/2019 2  % Final   Basophils Absolute 04/21/2019 0.1  0.0 - 0.1 K/uL Final   Immature Granulocytes 04/21/2019 1  % Final   Abs Immature Granulocytes 04/21/2019 0.02  0.00 - 0.07 K/uL Final   Performed at [facility], [place] [address], [place],  [address]    (this displays the last labs from the last 3 days)  No results found for: TOTALPROTELP, ALBUMINELP, A1GS, A2GS, BETS, BETA2SER, GAMS, MSPIKE, SPEI (this displays SPEP labs)  No results found for: KPAFRELGTCHN, LAMBDASER, KAPLAMBRATIO (kappa/lambda light chains)  No results found for: HGBA, HGBA2QUANT, HGBFQUANT, HGBSQUAN (Hemoglobinopathy evaluation)   No results found for: LDH  No results found for: IRON, TIBC, IRONPCTSAT (Iron and TIBC)  No results found for: FERRITIN  Urinalysis No results  found for: COLORURINE, APPEARANCEUR, LABSPEC, PHURINE, GLUCOSEU, HGBUR, BILIRUBINUR, KETONESUR, PROTEINUR, UROBILINOGEN, NITRITE, LEUKOCYTESUR   STUDIES: No results found.  ELIGIBLE FOR AVAILABLE RESEARCH PROTOCOL: no  ASSESSMENT: 58 y.o. [place] woman status post left breast biopsy 02/12/2019 for a clinical T1b N0, stage IA invasive ductal carcinoma, estrogen receptor positive, progesterone receptor negative, HER-2 amplified, with an MIB-1-1 of 25%  (1) status post left lumpectomy and sentinel lymph node sampling 03/05/2019 for a pT1c pN0, stage IA invasive ductal carcinoma, grade 2, with positive lymphovascular invasion but negative margins  (a) a total of 8 lymph nodes removed (4 sentinel)   (2) adjuvant chemotherapy with paclitaxel and trastuzumab weekly x12 started 04/07/2019  (3) trastuzumab to be continued to complete 1 year  (a) echo 02/26/2019 shows an ejection fraction in the 60-65% range  (4) adjuvant radiation to follow  (5) antiestrogens to start at the completion of local treatment  (6) genetics testing 02/25/2019 through the Multi-Gene Panel offered by [facility] found no deleterious mutations in AIP, ALK, APC, ATM, AXIN2,BAP1,  BARD1, BLM, BMPR1A, BRCA1, BRCA2, BRIP1, CASR, CDC73, CDH1, CDK4, CDKN1B, CDKN1C, CDKN2A (p14ARF), CDKN2A (p16INK4a), CEBPA, CHEK2, CTNNA1, DICER1, DIS3L2, EGFR (c.2369C>T, p.Thr790Met variant only), EPCAM (Deletion/duplication testing only), FH, FLCN, GATA2, GPC3, GREM1 (Promoter region deletion/duplication testing only), HOXB13 (c.251G>A, p.Gly84Glu), HRAS, KIT, MAX, MEN1, MET, MITF (c.952G>A, p.Glu318Lys variant only), MLH1, MSH2, MSH3, MSH6, MUTYH, NBN, NF1, NF2, NTHL1, PALB2, PDGFRA, PHOX2B, PMS2, POLD1, POLE, POT1, PRKAR1A, PTCH1, PTEN, RAD50, RAD51C, RAD51D, RB1, RECQL4, RET, RNF43, RUNX1, SDHAF2, SDHA (sequence changes only), SDHB, SDHC, SDHD, SMAD4, SMARCA4, SMARCB1, SMARCE1, STK11, SUFU, TERC, TERT, TMEM127,  TP53, TSC1, TSC2, VHL, WRN and WT1.        PLAN: Zanetta continues to do well.  She will continue on weekly Paclitaxel and Trastuzumab.  She has no peripheral neuropathy, and I recommended she continue with cryotherapy.    Makinlee's mouth is improving.  She is conitnuing on Valtrex BID and is tolerating it well.  Cayci hasn't yet lost her hair, however her scalp is getting tender.  I let her know that it could be a sign that it is going to start to fall out.  We reviewed her diarrhea.  Right now it is manageable, but she and I talked about using imodium if needed, and reasons to call about her diarrhea, if she develops any red flags.  Collette will return weekly for labs, and her treatment.  She will f/u with Dr. Jana Hakim in 2 weeks.  She knows that on treatment days that she is not seeing myself or Dr. Jana Hakim, but needs to, to just let her nurse know and one of [place] would be happy to stop by.    She was recommended to continue with the appropriate pandemic precautions. She knows to call for any questions that may arise between now and her next appointment.  We are happy to see her sooner if needed.  A total of (20) minutes of face-to-face time was spent with this patient with greater than 50% of that time in counseling and care-coordination.   Scot Dock, NP   04/21/2019 1:33 PM Medical Oncology and Hematology [facility] [address] [place], [place] [address] Tel. [phone]    Fax. [phone]

## 2019-04-28 ENCOUNTER — Other Ambulatory Visit: Payer: Self-pay

## 2019-04-28 ENCOUNTER — Inpatient Hospital Stay: Payer: Managed Care, Other (non HMO)

## 2019-04-28 ENCOUNTER — Inpatient Hospital Stay: Payer: Managed Care, Other (non HMO) | Attending: Oncology

## 2019-04-28 ENCOUNTER — Other Ambulatory Visit: Payer: Self-pay | Admitting: Oncology

## 2019-04-28 VITALS — BP 144/78 | HR 64 | Temp 98.5°F | Resp 18

## 2019-04-28 DIAGNOSIS — G4700 Insomnia, unspecified: Secondary | ICD-10-CM | POA: Diagnosis not present

## 2019-04-28 DIAGNOSIS — Z5111 Encounter for antineoplastic chemotherapy: Secondary | ICD-10-CM | POA: Diagnosis present

## 2019-04-28 DIAGNOSIS — C50512 Malignant neoplasm of lower-outer quadrant of left female breast: Secondary | ICD-10-CM

## 2019-04-28 DIAGNOSIS — M8580 Other specified disorders of bone density and structure, unspecified site: Secondary | ICD-10-CM | POA: Diagnosis not present

## 2019-04-28 DIAGNOSIS — Z5112 Encounter for antineoplastic immunotherapy: Secondary | ICD-10-CM | POA: Diagnosis present

## 2019-04-28 DIAGNOSIS — Z95828 Presence of other vascular implants and grafts: Secondary | ICD-10-CM

## 2019-04-28 DIAGNOSIS — Z170 Estrogen receptor positive status [ER+]: Secondary | ICD-10-CM

## 2019-04-28 DIAGNOSIS — G629 Polyneuropathy, unspecified: Secondary | ICD-10-CM | POA: Insufficient documentation

## 2019-04-28 DIAGNOSIS — E039 Hypothyroidism, unspecified: Secondary | ICD-10-CM | POA: Insufficient documentation

## 2019-04-28 LAB — COMPREHENSIVE METABOLIC PANEL
ALT: 15 U/L (ref 0–44)
AST: 16 U/L (ref 15–41)
Albumin: 3.7 g/dL (ref 3.5–5.0)
Alkaline Phosphatase: 44 U/L (ref 38–126)
Anion gap: 7 (ref 5–15)
BUN: 18 mg/dL (ref 6–20)
CO2: 26 mmol/L (ref 22–32)
Calcium: 8.8 mg/dL — ABNORMAL LOW (ref 8.9–10.3)
Chloride: 98 mmol/L (ref 98–111)
Creatinine, Ser: 0.78 mg/dL (ref 0.44–1.00)
GFR calc Af Amer: >60 mL/min (ref >60)
GFR calc non Af Amer: >60 mL/min (ref >60)
Glucose, Bld: 97 mg/dL (ref 70–99)
Potassium: 4.1 mmol/L (ref 3.5–5.1)
Sodium: 131 mmol/L — ABNORMAL LOW (ref 135–145)
Total Bilirubin: 0.2 mg/dL — ABNORMAL LOW (ref 0.3–1.2)
Total Protein: 6.7 g/dL (ref 6.5–8.1)

## 2019-04-28 LAB — CBC WITH DIFFERENTIAL/PLATELET
Abs Immature Granulocytes: 0.02 10*3/uL (ref 0.00–0.07)
Basophils Absolute: 0.1 10*3/uL (ref 0.0–0.1)
Basophils Relative: 1 %
Eosinophils Absolute: 0 10*3/uL (ref 0.0–0.5)
Eosinophils Relative: 1 %
HCT: 31.8 % — ABNORMAL LOW (ref 36.0–46.0)
Hemoglobin: 10.7 g/dL — ABNORMAL LOW (ref 12.0–15.0)
Immature Granulocytes: 1 %
Lymphocytes Relative: 24 %
Lymphs Abs: 0.9 10*3/uL (ref 0.7–4.0)
MCH: 30.7 pg (ref 26.0–34.0)
MCHC: 33.6 g/dL (ref 30.0–36.0)
MCV: 91.1 fL (ref 80.0–100.0)
Monocytes Absolute: 0.2 10*3/uL (ref 0.1–1.0)
Monocytes Relative: 6 %
Neutro Abs: 2.4 10*3/uL (ref 1.7–7.7)
Neutrophils Relative %: 67 %
Platelets: 353 10*3/uL (ref 150–400)
RBC: 3.49 MIL/uL — ABNORMAL LOW (ref 3.87–5.11)
RDW: 12.4 % (ref 11.5–15.5)
WBC: 3.6 10*3/uL — ABNORMAL LOW (ref 4.0–10.5)
nRBC: 0 % (ref 0.0–0.2)

## 2019-04-28 MED ORDER — SODIUM CHLORIDE 0.9 % IV SOLN
Freq: Once | INTRAVENOUS | Status: AC
  Administered 2019-04-28: 13:00:00 via INTRAVENOUS
  Filled 2019-04-28: qty 250

## 2019-04-28 MED ORDER — DEXAMETHASONE SODIUM PHOSPHATE 10 MG/ML IJ SOLN
INTRAMUSCULAR | Status: AC
  Filled 2019-04-28: qty 1

## 2019-04-28 MED ORDER — HEPARIN SOD (PORK) LOCK FLUSH 100 UNIT/ML IV SOLN
500.0000 [IU] | Freq: Once | INTRAVENOUS | Status: AC | PRN
  Administered 2019-04-28: 500 [IU]
  Filled 2019-04-28: qty 5

## 2019-04-28 MED ORDER — SODIUM CHLORIDE 0.9 % IV SOLN
80.0000 mg/m2 | Freq: Once | INTRAVENOUS | Status: AC
  Administered 2019-04-28: 15:00:00 150 mg via INTRAVENOUS
  Filled 2019-04-28: qty 25

## 2019-04-28 MED ORDER — TRASTUZUMAB-DKST CHEMO 150 MG IV SOLR
150.0000 mg | Freq: Once | INTRAVENOUS | Status: AC
  Administered 2019-04-28: 14:00:00 150 mg via INTRAVENOUS
  Filled 2019-04-28: qty 7.14

## 2019-04-28 MED ORDER — SODIUM CHLORIDE 0.9% FLUSH
10.0000 mL | INTRAVENOUS | Status: DC | PRN
  Administered 2019-04-28: 12:00:00 10 mL
  Filled 2019-04-28: qty 10

## 2019-04-28 MED ORDER — SODIUM CHLORIDE 0.9% FLUSH
10.0000 mL | INTRAVENOUS | Status: DC | PRN
  Administered 2019-04-28: 16:00:00 10 mL
  Filled 2019-04-28: qty 10

## 2019-04-28 MED ORDER — FAMOTIDINE IN NACL 20-0.9 MG/50ML-% IV SOLN
20.0000 mg | Freq: Once | INTRAVENOUS | Status: AC
  Administered 2019-04-28: 14:00:00 20 mg via INTRAVENOUS

## 2019-04-28 MED ORDER — FAMOTIDINE IN NACL 20-0.9 MG/50ML-% IV SOLN
INTRAVENOUS | Status: AC
  Filled 2019-04-28: qty 50

## 2019-04-28 MED ORDER — DIPHENHYDRAMINE HCL 50 MG/ML IJ SOLN
25.0000 mg | Freq: Once | INTRAMUSCULAR | Status: AC
  Administered 2019-04-28: 25 mg via INTRAVENOUS

## 2019-04-28 MED ORDER — DEXAMETHASONE SODIUM PHOSPHATE 10 MG/ML IJ SOLN
4.0000 mg | Freq: Once | INTRAMUSCULAR | Status: AC
  Administered 2019-04-28: 14:00:00 4 mg via INTRAVENOUS

## 2019-04-28 MED ORDER — ACETAMINOPHEN 325 MG PO TABS
650.0000 mg | ORAL_TABLET | Freq: Once | ORAL | Status: AC
  Administered 2019-04-28: 14:00:00 650 mg via ORAL

## 2019-04-28 MED ORDER — DIPHENHYDRAMINE HCL 50 MG/ML IJ SOLN
INTRAMUSCULAR | Status: AC
  Filled 2019-04-28: qty 1

## 2019-04-28 MED ORDER — ACETAMINOPHEN 325 MG PO TABS
ORAL_TABLET | ORAL | Status: AC
  Filled 2019-04-28: qty 2

## 2019-04-28 NOTE — Patient Instructions (Signed)
Coronavirus (COVID-19) Are you at risk?  Are you at risk for the Coronavirus (COVID-19)?  To be considered HIGH RISK for Coronavirus (COVID-19), you have to meet the following criteria:  . Traveled to [place], [place], [place], [place] or [place]; or in the [place] to [place], [place], [place], or [place]; and have fever, cough, and shortness of breath within the last 2 weeks of travel OR . Been in close contact with a person diagnosed with COVID-19 within the last 2 weeks and have fever, cough, and shortness of breath . IF YOU DO NOT MEET THESE CRITERIA, YOU ARE CONSIDERED LOW RISK FOR COVID-19.  What to do if you are HIGH RISK for COVID-19?  . If you are having a medical emergency, call 911. . Seek medical care right away. Before you go to a doctor's office, urgent care or emergency department, call ahead and tell them about your recent travel, contact with someone diagnosed with COVID-19, and your symptoms. You should receive instructions from your physician's office regarding next steps of care.  . When you arrive at healthcare provider, tell the healthcare staff immediately you have returned from visiting [place], [place], [place], [place] or [place]; or traveled in the [place] to [place], [place], [place], or [place]; in the last two weeks or you have been in close contact with a person diagnosed with COVID-19 in the last 2 weeks.   . Tell the health care staff about your symptoms: fever, cough and shortness of breath. . After you have been seen by a medical provider, you will be either: o Tested for (COVID-19) and discharged home on quarantine except to seek medical care if symptoms worsen, and asked to  - Stay home and avoid contact with others until you get your results (4-5 days)  - Avoid travel on public transportation if possible (such as bus, train, or airplane) or o Sent to the Emergency Department by EMS for evaluation, COVID-19 testing, and possible  admission depending on your condition and test results.  What to do if you are LOW RISK for COVID-19?  Reduce your risk of any infection by using the same precautions used for avoiding the common cold or flu:  . Wash your hands often with soap and warm water for at least 20 seconds.  If soap and water are not readily available, use an alcohol-based hand sanitizer with at least 60% alcohol.  . If coughing or sneezing, cover your mouth and nose by coughing or sneezing into the elbow areas of your shirt or coat, into a tissue or into your sleeve (not your hands). . Avoid shaking hands with others and consider head nods or verbal greetings only. . Avoid touching your eyes, nose, or mouth with unwashed hands.  . Avoid close contact with people who are sick. . Avoid places or events with large numbers of people in one location, like concerts or sporting events. . Carefully consider travel plans you have or are making. . If you are planning any travel outside or inside the [place], visit the CDC's Travelers' Health webpage for the latest health notices. . If you have some symptoms but not all symptoms, continue to monitor at home and seek medical attention if your symptoms worsen. . If you are having a medical emergency, call 911.   ADDITIONAL HEALTHCARE OPTIONS FOR PATIENTS  [facility] / e-Visit: [email or web address]         [facility]: [phone]  Onset   Urgent Care: [facility]: [facility] Discharge Instructions for Patients Receiving Chemotherapy  Today you received the following chemotherapy agents Trastuzumab and Taxol  To help prevent nausea and vomiting after your treatment, we encourage you to take your nausea medication as directed.   If you develop nausea and vomiting that is not controlled by your nausea medication, call the clinic.    BELOW ARE SYMPTOMS THAT SHOULD BE REPORTED IMMEDIATELY:  *FEVER GREATER THAN 100.5 F  *CHILLS WITH OR WITHOUT FEVER  NAUSEA AND VOMITING THAT IS NOT CONTROLLED WITH YOUR NAUSEA MEDICATION  *UNUSUAL SHORTNESS OF BREATH  *UNUSUAL BRUISING OR BLEEDING  TENDERNESS IN MOUTH AND THROAT WITH OR WITHOUT PRESENCE OF ULCERS  *URINARY PROBLEMS  *BOWEL PROBLEMS  UNUSUAL RASH Items with * indicate a potential emergency and should be followed up as soon as possible.  Feel free to call the clinic should you have any questions or concerns. The clinic phone number is [phone].  Please show the [facility] at check-in to the Emergency Department and triage nurse.

## 2019-05-04 NOTE — Progress Notes (Signed)
Allison Hanna  [phone] Fax:([phone]) [phone]     ID: [name] DOB: 1961/07/02  MR#: [id number]  BWG#:[id number]  Patient Care Team: Merrilee Seashore, MD as PCP - General (Internal Medicine) Mauro Kaufmann, RN as Oncology Nurse Navigator Rockwell Germany, RN as Oncology Nurse Navigator Kyzer Blowe, Virgie Dad, MD as Consulting Physician (Oncology) Gery Pray, MD as Consulting Physician (Radiation Oncology) Fanny Skates, MD as Consulting Physician (General Surgery) Brien Few, MD as Consulting Physician (Obstetrics and Gynecology) Larey Dresser, MD as Consulting Physician (Cardiology) Feliberto Harts R (Inactive) Chauncey Cruel, MD OTHER MD:   CHIEF COMPLAINT: Estrogen receptor positive breast cancer  CURRENT TREATMENT: Adjuvant chemotherapy   INTERVAL HISTORY: [name] returns today for follow-up and treatment of her estrogen receptor positive breast cancer. She was last seen here on 04/21/2019.   She continues on adjuvant chemotherapy consisting of paclitaxel and trastuzumab (Ogivri). Today is day 1 cycle 5.  She is tolerating this generally well.  The mouth sores have pretty much resolved on the current Valtrex dose.  She has had no problems with nausea or vomiting, and she walks between 3 and 5 miles most days.  Maelee's last echocardiogram on 02/26/2019, showed an ejection fraction in the 60% - 65% range.   Since her last visit here, she has not undergone any additional studies.    REVIEW OF SYSTEMS: [name] is worried that the HER-2 may stop working when we stopped giving it.  She has the same question about the antiestrogens.  We discussed this at length today.  She is taking appropriate pandemic precautions.  Her taste is good and she has not lost any weight or gained any weight.  A detailed review of systems today was otherwise noncontributory   HISTORY OF CURRENT ILLNESS: From the original intake note:  "Allison Hanna"  presented with a palpable left breast mass. She initially experienced soreness to her left breast around April 2020 and tried to get a mammogram at this point, but due to pandemic concerns, the scan was delayed.  She did not actually feel a mass in the breast until June 1.  She underwent bilateral diagnostic mammography with tomography and left breast ultrasonography at [facility] on 02/11/2019 showing: breast density category B; irregular hyperechoic mass in the left breast at 4 o'clock 5 cm from the nipple measuring 8 mm; no enlarged adenopathy in the left axilla.  Accordingly on 02/12/2019 she proceeded to biopsy of the left breast area in question. The pathology from this procedure ([id number]) showed: invasive mammary carcinoma, e-cadherin positive. Prognostic indicators significant for: estrogen receptor, 100% positive with strong staining intensity and progesterone receptor, 0% negative. Proliferation marker Ki67 at 25%. HER2 positive by immunohistochemistry, (3+).  The patient's subsequent history is as detailed below.   PAST MEDICAL HISTORY: Past Medical History:  Diagnosis Date  . Anxiety   . Family history of breast cancer   . Family history of melanoma   . High cholesterol   . Hypothyroid   . Paralyzed vocal cords 2014  . Raynauds syndrome    cold weather triggers fingers to turn white  History of palpitations (resolved)   PAST SURGICAL HISTORY: Past Surgical History:  Procedure Laterality Date  . BREAST LUMPECTOMY WITH RADIOACTIVE SEED AND SENTINEL LYMPH NODE BIOPSY Left 03/05/2019   Procedure: LEFT BREAST LUMPECTOMY WITH RADIOACTIVE SEED AND LEFT AXILLARY DEEP SENTINEL LYMPH NODE BIOPSY INJECT BLUE DYE LEFT BREAST;  Surgeon: Fanny Skates, MD;  Location: [facility];  Service: General;  Laterality: Left;  . KNEE SURGERY Bilateral    arthroscopy for menicus tears  . PORTACATH PLACEMENT Right 03/05/2019   Procedure: INSERTION PORT-A-CATH;  Surgeon: Fanny Skates, MD;  Location: [facility];  Service: General;  Laterality: Right;  . WRIST SURGERY      FAMILY HISTORY: Family History  Problem Relation Age of Onset  . Breast cancer Mother 24  . Cervical cancer Mother   . Heart attack Father   . Skin cancer Brother   . Breast cancer Paternal Aunt        dx over 22s  . Breast cancer Paternal Aunt        dx over 59  . Uterine cancer Paternal Aunt 1  . Cervical cancer Maternal Aunt   . Liver cancer Paternal Uncle 7  . Stroke Maternal Grandmother   . Parkinson's disease Paternal Grandfather   . Breast cancer Cousin 66       mother's maternal first cousin  . Melanoma Cousin 12       pat first cousin  Patient's father was 58 years old when he died from heart attack. Patient's mother died at age 31. She was diagnosed with breast cancer at age 4. Two paternal aunts were diagnosed with breast cancer at older ages, one of which also had uterine cancer. She has 1 brother. He has a history of skin cancer.    GYNECOLOGIC HISTORY:  No LMP recorded. Patient is postmenopausal. Menarche: 58 years old Age at first live birth: 58 years old [place] 5 P 3 LMP 2007 Contraceptive: yes, pill for about 8 years, no complications HRT no  Hysterectomy? no BSO? 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 She is married. [name] is in charge of operations for [employer]. She lives at home with her husband. [name], age 58, lives in [place], [place] as a Research scientist (life sciences). [name], age 19, lives in [place] as a Careers adviser. [name], age 45, lives in [place] as a Ship broker and works part-time in [employer]. She has no grandchildren.     ADVANCED DIRECTIVES: [name] is automatically her HCPOA.   HEALTH MAINTENANCE: Social History   Tobacco Use  . Smoking status: Never Smoker  . Smokeless tobacco: Never Used   Substance Use Topics  . Alcohol use: Yes    Alcohol/week: 2.0 standard drinks    Types: 2 Standard drinks or equivalent per week  . Drug use: Never     Colonoscopy: 2010  PAP: 08/2018  Bone density: 2019, osteopenia   No Known Allergies  Current Outpatient Medications  Medication Sig Dispense Refill  . calcium carbonate (OS-CAL) 600 MG TABS tablet Take 1,200 mg by mouth 2 (two) times daily with a meal.    . Cholecalciferol (VITAMIN D3) 25 MCG (1000 UT) CAPS Take 2,000 Units by mouth daily.    . Levothyroxine Sodium (SYNTHROID PO) Take 75 mcg by mouth daily.     Marland Kitchen lidocaine-prilocaine (EMLA) cream Apply to affected area once 30 g 3  . Magnesium 250 MG TABS Take 250 mg by mouth daily.    . prochlorperazine (COMPAZINE) 10 MG tablet Take 1 tablet (10 mg total) by mouth every 6 (six) hours as needed (Nausea or vomiting). 30 tablet 1  . valACYclovir (VALTREX) 500 MG tablet Take 1 tablet (500 mg total) by mouth 2 (two) times daily. 60 tablet 1  .  vitamin C (ASCORBIC ACID) 500 MG tablet Take 500 mg by mouth daily.     No current facility-administered medications for this visit.    Facility-Administered Medications Ordered in Other Visits  Medication Dose Route Frequency Provider Last Rate Last Dose  . sodium chloride flush (NS) 0.9 % injection 10 mL  10 mL Intracatheter PRN Falan Hensler, Virgie Dad, MD        OBJECTIVE: Middle-aged white woman in no acute distress  Vitals:   05/05/19 1207  BP: (!) 153/87  Pulse: 72  Resp: 18  Temp: 98.5 F (36.9 C)  SpO2: 100%   Wt Readings from Last 3 Encounters:  05/05/19 150 lb 14.4 oz (68.4 kg)  04/21/19 158 lb 3.2 oz (71.8 kg)  04/14/19 159 lb 12.8 oz (72.5 kg)   Body mass index is 24.36 kg/m.    ECOG FS:1 - Symptomatic but completely ambulatory  Sclerae unicteric, EOMs intact Wearing a mask No cervical or supraclavicular adenopathy Lungs no rales or rhonchi Heart regular rate and rhythm Abd soft, nontender, positive bowel sounds MSK  no focal spinal tenderness, no upper extremity lymphedema Neuro: nonfocal, well oriented, appropriate affect Breasts: The right breast is benign per the left breast is status post lumpectomy.  The cosmetic result is excellent.  The axillae are negative.   LAB RESULTS:  CMP     Component Value Date/Time   NA 131 (L) 04/28/2019 1208   K 4.1 04/28/2019 1208   CL 98 04/28/2019 1208   CO2 26 04/28/2019 1208   GLUCOSE 97 04/28/2019 1208   BUN 18 04/28/2019 1208   CREATININE 0.78 04/28/2019 1208   CREATININE 0.95 02/18/2019 1242   CALCIUM 8.8 (L) 04/28/2019 1208   PROT 6.7 04/28/2019 1208   ALBUMIN 3.7 04/28/2019 1208   AST 16 04/28/2019 1208   AST 16 02/18/2019 1242   ALT 15 04/28/2019 1208   ALT 14 02/18/2019 1242   ALKPHOS 44 04/28/2019 1208   BILITOT 0.2 (L) 04/28/2019 1208   BILITOT 0.3 02/18/2019 1242   GFRNONAA >60 04/28/2019 1208   GFRNONAA >60 02/18/2019 1242   GFRAA >60 04/28/2019 1208   GFRAA >60 02/18/2019 1242    No results found for: TOTALPROTELP, ALBUMINELP, A1GS, A2GS, BETS, BETA2SER, GAMS, MSPIKE, SPEI  No results found for: KPAFRELGTCHN, LAMBDASER, KAPLAMBRATIO  Lab Results  Component Value Date   WBC 3.6 (L) 04/28/2019   NEUTROABS 2.4 04/28/2019   HGB 10.7 (L) 04/28/2019   HCT 31.8 (L) 04/28/2019   MCV 91.1 04/28/2019   PLT 353 04/28/2019    @LASTCHEMISTRY @  No results found for: LABCA2  No components found for: NFAOZH086  No results for input(s): INR in the last 168 hours.  No results found for: LABCA2  No results found for: VHQ469  No results found for: GEX528  No results found for: UXL244  No results found for: CA2729  No components found for: HGQUANT  No results found for: CEA1 / No results found for: CEA1   No results found for: AFPTUMOR  No results found for: CHROMOGRNA  No results found for: PSA1  No visits with results within 3 Day(s) from this visit.  Latest known visit with results is:  Appointment on 04/28/2019   Component Date Value Ref Range Status  . WBC 04/28/2019 3.6* 4.0 - 10.5 K/uL Final  . RBC 04/28/2019 3.49* 3.87 - 5.11 MIL/uL Final  . Hemoglobin 04/28/2019 10.7* 12.0 - 15.0 g/dL Final  . HCT 04/28/2019 31.8* 36.0 - 46.0 % Final  .  MCV 04/28/2019 91.1  80.0 - 100.0 fL Final  . MCH 04/28/2019 30.7  26.0 - 34.0 pg Final  . MCHC 04/28/2019 33.6  30.0 - 36.0 g/dL Final  . RDW 04/28/2019 12.4  11.5 - 15.5 % Final  . Platelets 04/28/2019 353  150 - 400 K/uL Final  . nRBC 04/28/2019 0.0  0.0 - 0.2 % Final  . Neutrophils Relative % 04/28/2019 67  % Final  . Neutro Abs 04/28/2019 2.4  1.7 - 7.7 K/uL Final  . Lymphocytes Relative 04/28/2019 24  % Final  . Lymphs Abs 04/28/2019 0.9  0.7 - 4.0 K/uL Final  . Monocytes Relative 04/28/2019 6  % Final  . Monocytes Absolute 04/28/2019 0.2  0.1 - 1.0 K/uL Final  . Eosinophils Relative 04/28/2019 1  % Final  . Eosinophils Absolute 04/28/2019 0.0  0.0 - 0.5 K/uL Final  . Basophils Relative 04/28/2019 1  % Final  . Basophils Absolute 04/28/2019 0.1  0.0 - 0.1 K/uL Final  . Immature Granulocytes 04/28/2019 1  % Final  . Abs Immature Granulocytes 04/28/2019 0.02  0.00 - 0.07 K/uL Final   Performed at [facility], [place] [address] [place], [place] [address]  . Sodium 04/28/2019 131* 135 - 145 mmol/L Final  . Potassium 04/28/2019 4.1  3.5 - 5.1 mmol/L Final  . Chloride 04/28/2019 98  98 - 111 mmol/L Final  . CO2 04/28/2019 26  22 - 32 mmol/L Final  . Glucose, Bld 04/28/2019 97  70 - 99 mg/dL Final  . BUN 04/28/2019 18  6 - 20 mg/dL Final  . Creatinine, Ser 04/28/2019 0.78  0.44 - 1.00 mg/dL Final  . Calcium 04/28/2019 8.8* 8.9 - 10.3 mg/dL Final  . Total Protein 04/28/2019 6.7  6.5 - 8.1 g/dL Final  . Albumin 04/28/2019 3.7  3.5 - 5.0 g/dL Final  . AST 04/28/2019 16  15 - 41 U/L Final  . ALT 04/28/2019 15  0 - 44 U/L Final  . Alkaline Phosphatase 04/28/2019 44  38 - 126 U/L Final  . Total Bilirubin 04/28/2019 0.2* 0.3 - 1.2  mg/dL Final  . GFR calc non Af Amer 04/28/2019 >60  >60 mL/min Final  . GFR calc Af Amer 04/28/2019 >60  >60 mL/min Final  . Anion gap 04/28/2019 7  5 - 15 Final   Performed at [facility], [place] [address], [place], [place] [address]    (this displays the last labs from the last 3 days)  No results found for: TOTALPROTELP, ALBUMINELP, A1GS, A2GS, BETS, BETA2SER, GAMS, MSPIKE, SPEI (this displays SPEP labs)  No results found for: KPAFRELGTCHN, LAMBDASER, KAPLAMBRATIO (kappa/lambda light chains)  No results found for: HGBA, HGBA2QUANT, HGBFQUANT, HGBSQUAN (Hemoglobinopathy evaluation)   No results found for: LDH  No results found for: IRON, TIBC, IRONPCTSAT (Iron and TIBC)  No results found for: FERRITIN  Urinalysis No results found for: COLORURINE, APPEARANCEUR, LABSPEC, PHURINE, GLUCOSEU, HGBUR, BILIRUBINUR, KETONESUR, PROTEINUR, UROBILINOGEN, NITRITE, LEUKOCYTESUR   STUDIES: No results found.  ELIGIBLE FOR AVAILABLE RESEARCH PROTOCOL: no  ASSESSMENT: 58 y.o. [place] woman status post left breast biopsy 02/12/2019 for a clinical T1b N0, stage IA invasive ductal carcinoma, estrogen receptor positive, progesterone receptor negative, HER-2 amplified, with an MIB-1-1 of 25%  (1) status post left lumpectomy and sentinel lymph node sampling 03/05/2019 for a pT1c pN0, stage IA invasive ductal carcinoma, grade 2, with positive lymphovascular invasion but negative margins  (a) a total of 8 lymph nodes removed (4  sentinel)   (2) adjuvant chemotherapy with paclitaxel and trastuzumab weekly x12 started 04/07/2019  (3) trastuzumab to be continued to complete 1 year  (a) echo 02/26/2019 shows an ejection fraction in the 60-65% range  (4) adjuvant radiation to follow  (5) antiestrogens to start at the completion of local treatment  (6) genetics testing 02/25/2019 through the Multi-Gene Panel offered by [facility] found no deleterious mutations in AIP, ALK,  APC, ATM, AXIN2,BAP1,  BARD1, BLM, BMPR1A, BRCA1, BRCA2, BRIP1, CASR, CDC73, CDH1, CDK4, CDKN1B, CDKN1C, CDKN2A (p14ARF), CDKN2A (p16INK4a), CEBPA, CHEK2, CTNNA1, DICER1, DIS3L2, EGFR (c.2369C>T, p.Thr790Met variant only), EPCAM (Deletion/duplication testing only), FH, FLCN, GATA2, GPC3, GREM1 (Promoter region deletion/duplication testing only), HOXB13 (c.251G>A, p.Gly84Glu), HRAS, KIT, MAX, MEN1, MET, MITF (c.952G>A, p.Glu318Lys variant only), MLH1, MSH2, MSH3, MSH6, MUTYH, NBN, NF1, NF2, NTHL1, PALB2, PDGFRA, PHOX2B, PMS2, POLD1, POLE, POT1, PRKAR1A, PTCH1, PTEN, RAD50, RAD51C, RAD51D, RB1, RECQL4, RET, RNF43, RUNX1, SDHAF2, SDHA (sequence changes only), SDHB, SDHC, SDHD, SMAD4, SMARCA4, SMARCB1, SMARCE1, STK11, SUFU, TERC, TERT, TMEM127, TP53, TSC1, TSC2, VHL, WRN and WT1.      PLAN: Minha on direct questioning tells me that she is having numbness in her fingertips and actually partly down the finger as well.  She describes this as "mild".  It does not affect her activities of daily living.  She says it has been present for about 2 weeks.  I do not think we can give her any Taxol today and we may need to stop the Taxol altogether if this neuropathy persists.  Certainly she can receive her Herceptin today and we are proceeding with that  I will see her again next week and if the neuropathy has not resolved I would change her to carboplatin with an AUC of 2 and Gemzar at 800 mg/m to be given weekly until she completes a total of 12 doses of chemotherapy (she has already had 4 so would be 8 more doses).  This does not affect her Herceptin treatment plans of course  She tells me she has had shingles 4 times, now has mouth sores, has a problem with psoriasis, and has several relatives who have autoimmune disease.  She has been tested for HIV in the past.  I think it may be worthwhile checking her immune system a little and I am obtaining an ANA and quantitative immunoglobulins with her next set of labs  here  She knows to call for any other issue that may develop before the next visit.  Darcella Shiffman, Virgie Dad, MD  05/05/19 12:45 PM Medical Oncology and Hematology [facility] [address] [place], [place] [address] Tel. [phone]    Fax. [phone]  I, Jacqualyn Posey am acting as a Education administrator for Chauncey Cruel, MD.   I, [name] MD, have reviewed the above documentation for accuracy and completeness, and I agree with the above.

## 2019-05-05 ENCOUNTER — Inpatient Hospital Stay (HOSPITAL_BASED_OUTPATIENT_CLINIC_OR_DEPARTMENT_OTHER): Payer: Managed Care, Other (non HMO) | Admitting: Oncology

## 2019-05-05 ENCOUNTER — Inpatient Hospital Stay: Payer: Managed Care, Other (non HMO)

## 2019-05-05 ENCOUNTER — Encounter: Payer: Self-pay | Admitting: *Deleted

## 2019-05-05 ENCOUNTER — Other Ambulatory Visit: Payer: Self-pay

## 2019-05-05 VITALS — BP 153/87 | HR 72 | Temp 98.5°F | Resp 18 | Ht 66.0 in | Wt 150.9 lb

## 2019-05-05 DIAGNOSIS — Z5112 Encounter for antineoplastic immunotherapy: Secondary | ICD-10-CM | POA: Diagnosis not present

## 2019-05-05 DIAGNOSIS — Z95828 Presence of other vascular implants and grafts: Secondary | ICD-10-CM

## 2019-05-05 DIAGNOSIS — C50512 Malignant neoplasm of lower-outer quadrant of left female breast: Secondary | ICD-10-CM

## 2019-05-05 DIAGNOSIS — Z170 Estrogen receptor positive status [ER+]: Secondary | ICD-10-CM

## 2019-05-05 MED ORDER — ACETAMINOPHEN 325 MG PO TABS
ORAL_TABLET | ORAL | Status: AC
  Filled 2019-05-05: qty 2

## 2019-05-05 MED ORDER — ACETAMINOPHEN 325 MG PO TABS
650.0000 mg | ORAL_TABLET | Freq: Once | ORAL | Status: AC
  Administered 2019-05-05: 13:00:00 650 mg via ORAL

## 2019-05-05 MED ORDER — TRASTUZUMAB-DKST CHEMO 150 MG IV SOLR
150.0000 mg | Freq: Once | INTRAVENOUS | Status: AC
  Administered 2019-05-05: 150 mg via INTRAVENOUS
  Filled 2019-05-05: qty 7.14

## 2019-05-05 MED ORDER — FAMOTIDINE IN NACL 20-0.9 MG/50ML-% IV SOLN: 20.0000 mg | Freq: Once | INTRAVENOUS | Status: DC

## 2019-05-05 MED ORDER — DIPHENHYDRAMINE HCL 50 MG/ML IJ SOLN
25.0000 mg | Freq: Once | INTRAMUSCULAR | Status: AC
  Administered 2019-05-05: 13:00:00 25 mg via INTRAVENOUS

## 2019-05-05 MED ORDER — SODIUM CHLORIDE 0.9% FLUSH
10.0000 mL | INTRAVENOUS | Status: AC | PRN
  Filled 2019-05-05: qty 10

## 2019-05-05 MED ORDER — SODIUM CHLORIDE 0.9% FLUSH
10.0000 mL | INTRAVENOUS | Status: DC | PRN
  Administered 2019-05-05: 10 mL
  Filled 2019-05-05: qty 10

## 2019-05-05 MED ORDER — SODIUM CHLORIDE 0.9 % IV SOLN
Freq: Once | INTRAVENOUS | Status: AC
  Administered 2019-05-05: 13:00:00 via INTRAVENOUS
  Filled 2019-05-05: qty 250

## 2019-05-05 MED ORDER — DEXAMETHASONE SODIUM PHOSPHATE 10 MG/ML IJ SOLN: 4.0000 mg | Freq: Once | INTRAMUSCULAR | Status: DC

## 2019-05-05 MED ORDER — HEPARIN SOD (PORK) LOCK FLUSH 100 UNIT/ML IV SOLN
500.0000 [IU] | Freq: Once | INTRAVENOUS | Status: AC | PRN
  Administered 2019-05-05: 15:00:00 500 [IU]
  Filled 2019-05-05: qty 5

## 2019-05-05 MED ORDER — DIPHENHYDRAMINE HCL 50 MG/ML IJ SOLN
INTRAMUSCULAR | Status: AC
  Filled 2019-05-05: qty 1

## 2019-05-05 NOTE — Progress Notes (Signed)
DISCONTINUE ON PATHWAY REGIMEN - Breast   Paclitaxel Weekly + Trastuzumab Weekly:   Administer weekly:     Paclitaxel      Trastuzumab-xxxx      Trastuzumab-xxxx   **Always confirm dose/schedule in your pharmacy ordering system**  Trastuzumab (Maintenance - NO Loading Dose):   A cycle is every 21 days:     Trastuzumab-xxxx   **Always confirm dose/schedule in your pharmacy ordering system**  REASON: Toxicities / Adverse Event PRIOR TREATMENT: [id number]: Weekly Paclitaxel + Trastuzumab x 12 Weeks, Followed by Trastuzumab Maintenance q21 Days x 13 Cycles TREATMENT RESPONSE: Unable to Evaluate  START OFF PATHWAY REGIMEN - Breast   [id number]:Gemcitabine + Carboplatin (1000/2) q21 Days:   A cycle is every 21 days:     Gemcitabine      Carboplatin   **Always confirm dose/schedule in your pharmacy ordering system**  Patient Characteristics: Postoperative without Neoadjuvant Therapy (Pathologic Staging), Invasive Disease, Adjuvant Therapy, HER2 Positive, ER Positive, Node Negative, pT1b, pN0/N70m, Chemotherapy Indicated Therapeutic Status: Postoperative without Neoadjuvant Therapy (Pathologic Staging) AJCC Grade: G2 AJCC N Category: pN0 AJCC M Category: cM0 ER Status: Positive (+) AJCC 8 Stage Grouping: IA HER2 Status: Positive (+) Oncotype Dx Recurrence Score: Not Appropriate AJCC T Category: pT1 PR Status: Negative (-) Intervention Indicated: Chemotherapy Intent of Therapy: Curative Intent, Discussed with Patient

## 2019-05-05 NOTE — Patient Instructions (Signed)
[facility] Discharge Instructions for Patients Receiving Chemotherapy  Today you received the following chemotherapy agents Trastuzumab (OGIVRI).  To help prevent nausea and vomiting after your treatment, we encourage you to take your nausea medication as prescribed.   If you develop nausea and vomiting that is not controlled by your nausea medication, call the clinic.   BELOW ARE SYMPTOMS THAT SHOULD BE REPORTED IMMEDIATELY:  *FEVER GREATER THAN 100.5 F  *CHILLS WITH OR WITHOUT FEVER  NAUSEA AND VOMITING THAT IS NOT CONTROLLED WITH YOUR NAUSEA MEDICATION  *UNUSUAL SHORTNESS OF BREATH  *UNUSUAL BRUISING OR BLEEDING  TENDERNESS IN MOUTH AND THROAT WITH OR WITHOUT PRESENCE OF ULCERS  *URINARY PROBLEMS  *BOWEL PROBLEMS  UNUSUAL RASH Items with * indicate a potential emergency and should be followed up as soon as possible.  Feel free to call the clinic should you have any questions or concerns. The clinic phone number is [phone].  Please show the CHEMO ALERT CARD at check-in to the Emergency Department and triage nurse.  Coronavirus (COVID-19) Are you at risk?  Are you at risk for the Coronavirus (COVID-19)?  To be considered HIGH RISK for Coronavirus (COVID-19), you have to meet the following criteria:  . Traveled to [place], [place], [place], [place] or [place]; or in the [place] to [place], [place], [place], or [place]; and have fever, cough, and shortness of breath within the last 2 weeks of travel OR . Been in close contact with a person diagnosed with COVID-19 within the last 2 weeks and have fever, cough, and shortness of breath . IF YOU DO NOT MEET THESE CRITERIA, YOU ARE CONSIDERED LOW RISK FOR COVID-19.  What to do if you are HIGH RISK for COVID-19?  . If you are having a medical emergency, call 911. . Seek medical care right away. Before you go to a doctor's office, urgent care or emergency department, call ahead and tell them  about your recent travel, contact with someone diagnosed with COVID-19, and your symptoms. You should receive instructions from your physician's office regarding next steps of care.  . When you arrive at healthcare provider, tell the healthcare staff immediately you have returned from visiting [place], [place], [place], [place] or [place]; or traveled in the [place] to [place], [place], [place], or [place]; in the last two weeks or you have been in close contact with a person diagnosed with COVID-19 in the last 2 weeks.   . Tell the health care staff about your symptoms: fever, cough and shortness of breath. . After you have been seen by a medical provider, you will be either: o Tested for (COVID-19) and discharged home on quarantine except to seek medical care if symptoms worsen, and asked to  - Stay home and avoid contact with others until you get your results (4-5 days)  - Avoid travel on public transportation if possible (such as bus, train, or airplane) or o Sent to the Emergency Department by EMS for evaluation, COVID-19 testing, and possible admission depending on your condition and test results.  What to do if you are LOW RISK for COVID-19?  Reduce your risk of any infection by using the same precautions used for avoiding the common cold or flu:  . Wash your hands often with soap and warm water for at least 20 seconds.  If soap and water are not readily available, use an alcohol-based hand sanitizer with at least 60% alcohol.  . If coughing or   sneezing, cover your mouth and nose by coughing or sneezing into the elbow areas of your shirt or coat, into a tissue or into your sleeve (not your hands). . Avoid shaking hands with others and consider head nods or verbal greetings only. . Avoid touching your eyes, nose, or mouth with unwashed hands.  . Avoid close contact with people who are sick. . Avoid places or events with large numbers of people in one location, like concerts or  sporting events. . Carefully consider travel plans you have or are making. . If you are planning any travel outside or inside the [place], visit the CDC's Travelers' Health webpage for the latest health notices. . If you have some symptoms but not all symptoms, continue to monitor at home and seek medical attention if your symptoms worsen. . If you are having a medical emergency, call 911.   [place] / e-Visit: [email or web address]         [facility]: [facility]: [phone]                   [facility]: [phone]

## 2019-05-05 NOTE — Patient Instructions (Signed)

## 2019-05-06 ENCOUNTER — Telehealth: Payer: Self-pay | Admitting: Oncology

## 2019-05-06 NOTE — Telephone Encounter (Signed)
I talk with patient regarding add In for 9/15 follow up

## 2019-05-11 ENCOUNTER — Telehealth: Payer: Self-pay | Admitting: Adult Health

## 2019-05-11 NOTE — Progress Notes (Signed)
[facility]  [phone] [phone]     ID: [id number] CABAN DOB: 10-22-1960  MR#: [id number]  JYN#:[id number]  Patient Care Team: Merrilee Seashore, MD as PCP - General (Internal Medicine) Mauro Kaufmann, RN as Oncology Nurse Navigator Rockwell Germany, RN as Oncology Nurse Navigator Asad Keeven, Virgie Dad, MD as Consulting Physician (Oncology) Gery Pray, MD as Consulting Physician (Radiation Oncology) Fanny Skates, MD as Consulting Physician (General Surgery) Brien Few, MD as Consulting Physician (Obstetrics and Gynecology) Larey Dresser, MD as Consulting Physician (Cardiology) Feliberto Harts R (Inactive) Chauncey Cruel, MD OTHER MD:   CHIEF COMPLAINT: Estrogen receptor positive breast cancer  CURRENT TREATMENT: Adjuvant chemotherapy   INTERVAL HISTORY: Allison Hanna returns today for follow-up and treatment of her estrogen receptor positive breast cancer. She was last seen here on 05/05/2019.   She continues on adjuvant trastuzumab (Ogivri). Today is day 1 cycle 6.  She tolerates this well.  Kennede's last echocardiogram on 02/26/2019, showed an ejection fraction in the 60% - 65% range.  At the last visit, after 4 doses of weekly paclitaxel, she developed early peripheral neuropathy and we held the Taxol last week.  We intended to treat her with weekly carboplatin and gemcitabine for an additional 8 doses but her insurance has absolutely refused to approve that.  They said they would approve standard treatment with Cytoxan doxorubicin and paclitaxel, which of course makes no sense in this setting, or CMF.  I think 3 cycles of CMF while continuing the Herceptin would be a good way to complete the chemotherapy portion of her treatment.  It might also allow her to start radiation earlier.   REVIEW OF SYSTEMS: Allison Hanna is very concerned that "insurance is making the decisions".  This is of course disturbing.  I reassured her that the  current plan is very adequate and actually she is getting more chemotherapy then I think she needs instead of less.  Of course her goal is cure and we do not want to do anything that would compromise that.  Her peripheral neuropathy has not completely resolved but it is better.  It is only in her fingers and not her toes.  Aside from these issues a detailed review of systems today was stable   HISTORY OF CURRENT ILLNESS: From the original intake note:  "Allison Hanna" presented with a palpable left breast mass. She initially experienced soreness to her left breast around April 2020 and tried to get a mammogram at this point, but due to pandemic concerns, the scan was delayed.  She did not actually feel a mass in the breast until June 1.  She underwent bilateral diagnostic mammography with tomography and left breast ultrasonography at [facility] on 02/11/2019 showing: breast density category B; irregular hyperechoic mass in the left breast at 4 o'clock 5 cm from the nipple measuring 8 mm; no enlarged adenopathy in the left axilla.  Accordingly on 02/12/2019 she proceeded to biopsy of the left breast area in question. The pathology from this procedure ([id number]) showed: invasive mammary carcinoma, e-cadherin positive. Prognostic indicators significant for: estrogen receptor, 100% positive with strong staining intensity and progesterone receptor, 0% negative. Proliferation marker Ki67 at 25%. HER2 positive by immunohistochemistry, (3+).  The patient's subsequent history is as detailed below.   PAST MEDICAL HISTORY: Past Medical History:  Diagnosis Date  . Anxiety   . Family history of breast cancer   . Family history of melanoma   . High cholesterol   . Hypothyroid   .  Paralyzed vocal cords 2014  . Raynauds syndrome    cold weather triggers fingers to turn white  History of palpitations (resolved)   PAST SURGICAL HISTORY: Past Surgical History:  Procedure Laterality Date  . BREAST  LUMPECTOMY WITH RADIOACTIVE SEED AND SENTINEL LYMPH NODE BIOPSY Left 03/05/2019   Procedure: LEFT BREAST LUMPECTOMY WITH RADIOACTIVE SEED AND LEFT AXILLARY DEEP SENTINEL LYMPH NODE BIOPSY INJECT BLUE DYE LEFT BREAST;  Surgeon: Fanny Skates, MD;  Location: [facility];  Service: General;  Laterality: Left;  . KNEE SURGERY Bilateral    arthroscopy for menicus tears  . PORTACATH PLACEMENT Right 03/05/2019   Procedure: INSERTION PORT-A-CATH;  Surgeon: Fanny Skates, MD;  Location: [facility];  Service: General;  Laterality: Right;  . WRIST SURGERY      FAMILY HISTORY: Family History  Problem Relation Age of Onset  . Breast cancer Mother 23  . Cervical cancer Mother   . Heart attack Father   . Skin cancer Brother   . Breast cancer Paternal Aunt        dx over 5s  . Breast cancer Paternal Aunt        dx over 37  . Uterine cancer Paternal Aunt 31  . Cervical cancer Maternal Aunt   . Liver cancer Paternal Uncle 21  . Stroke Maternal Grandmother   . Parkinson's disease Paternal Grandfather   . Breast cancer Cousin 36       mother's maternal first cousin  . Melanoma Cousin 11       pat first cousin  Patient's father was 12 years old when he died from heart attack. Patient's mother died at age 94. She was diagnosed with breast cancer at age 75. Two paternal aunts were diagnosed with breast cancer at older ages, one of which also had uterine cancer. She has 1 brother. He has a history of skin cancer.    GYNECOLOGIC HISTORY:  No LMP recorded. Patient is postmenopausal. Menarche: 58 years old Age at first live birth: 58 years old [place] P 3 LMP 2007 Contraceptive: yes, pill for about 8 years, no complications HRT no  Hysterectomy? no BSO? 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 She is married. [name] is in charge of operations for [employer]. She lives at home with  her husband. [name], age 32, lives in [place], [place] as a Research scientist (life sciences). [name], age 44, lives in [place] as a Careers adviser. [name], age 11, lives in [place] as a Ship broker and works part-time in [employer]. She has no grandchildren.     ADVANCED DIRECTIVES: [name] is automatically her HCPOA.   HEALTH MAINTENANCE: Social History   Tobacco Use  . Smoking status: Never Smoker  . Smokeless tobacco: Never Used  Substance Use Topics  . Alcohol use: Yes    Alcohol/week: 2.0 standard drinks    Types: 2 Standard drinks or equivalent per week  . Drug use: Never     Colonoscopy: 2010  PAP: 08/2018  Bone density: 2019, osteopenia   No Known Allergies  Current Outpatient Medications  Medication Sig Dispense Refill  . calcium carbonate (OS-CAL) 600 MG TABS tablet Take 1,200 mg by mouth 2 (two) times daily with a meal.    . Cholecalciferol (VITAMIN D3) 25 MCG (1000 UT) CAPS Take 2,000 Units by mouth daily.    . Levothyroxine Sodium (SYNTHROID PO) Take 75 mcg  by mouth daily.     . Magnesium 250 MG TABS Take 250 mg by mouth daily.    . valACYclovir (VALTREX) 500 MG tablet Take 1 tablet (500 mg total) by mouth 2 (two) times daily. 60 tablet 1  . vitamin C (ASCORBIC ACID) 500 MG tablet Take 500 mg by mouth daily.     No current facility-administered medications for this visit.    Facility-Administered Medications Ordered in Other Visits  Medication Dose Route Frequency Provider Last Rate Last Dose  . sodium chloride flush (NS) 0.9 % injection 10 mL  10 mL Intracatheter PRN Mayeli Bornhorst, Virgie Dad, MD        OBJECTIVE: Middle-aged white woman who appears well  There were no vitals filed for this visit. Wt Readings from Last 3 Encounters:  05/05/19 150 lb 14.4 oz (68.4 kg)  04/21/19 158 lb 3.2 oz (71.8 kg)  04/14/19 159 lb 12.8 oz (72.5 kg)   There is no height or weight on file to calculate BMI.    ECOG FS:1 - Symptomatic but completely  ambulatory  Ocular: Sclerae unicteric, pupils round and equal Ear-nose-throat: Wearing a mask Lymphatic: No cervical or supraclavicular adenopathy Lungs no rales or rhonchi Heart regular rate and rhythm Abd soft, nontender, positive bowel sounds MSK no focal spinal tenderness, no joint edema Neuro: non-focal, well-oriented, appropriate affect Breasts: Deferred   LAB RESULTS:  CMP     Component Value Date/Time   NA 131 (L) 04/28/2019 1208   K 4.1 04/28/2019 1208   CL 98 04/28/2019 1208   CO2 26 04/28/2019 1208   GLUCOSE 97 04/28/2019 1208   BUN 18 04/28/2019 1208   CREATININE 0.78 04/28/2019 1208   CREATININE 0.95 02/18/2019 1242   CALCIUM 8.8 (L) 04/28/2019 1208   PROT 6.7 04/28/2019 1208   ALBUMIN 3.7 04/28/2019 1208   AST 16 04/28/2019 1208   AST 16 02/18/2019 1242   ALT 15 04/28/2019 1208   ALT 14 02/18/2019 1242   ALKPHOS 44 04/28/2019 1208   BILITOT 0.2 (L) 04/28/2019 1208   BILITOT 0.3 02/18/2019 1242   GFRNONAA >60 04/28/2019 1208   GFRNONAA >60 02/18/2019 1242   GFRAA >60 04/28/2019 1208   GFRAA >60 02/18/2019 1242    No results found for: TOTALPROTELP, ALBUMINELP, A1GS, A2GS, BETS, BETA2SER, GAMS, MSPIKE, SPEI  No results found for: KPAFRELGTCHN, LAMBDASER, KAPLAMBRATIO  Lab Results  Component Value Date   WBC 3.6 (L) 04/28/2019   NEUTROABS 2.4 04/28/2019   HGB 10.7 (L) 04/28/2019   HCT 31.8 (L) 04/28/2019   MCV 91.1 04/28/2019   PLT 353 04/28/2019    _0 @  No results found for: LABCA2  No components found for: MVHQIO962  No results for input(s): INR in the last 168 hours.  No results found for: LABCA2  No results found for: XBM841  No results found for: LKG401  No results found for: UUV253  No results found for: CA2729  No components found for: HGQUANT  No results found for: CEA1 / No results found for: CEA1   No results found for: AFPTUMOR  No results found for: CHROMOGRNA  No results found for: PSA1  No visits  with results within 3 Day(s) from this visit.  Latest known visit with results is:  Appointment on 04/28/2019  Component Date Value Ref Range Status  . WBC 04/28/2019 3.6* 4.0 - 10.5 K/uL Final  . RBC 04/28/2019 3.49* 3.87 - 5.11 MIL/uL Final  . Hemoglobin 04/28/2019 10.7* 12.0 - 15.0 g/dL Final  .  HCT 04/28/2019 31.8* 36.0 - 46.0 % Final  . MCV 04/28/2019 91.1  80.0 - 100.0 fL Final  . MCH 04/28/2019 30.7  26.0 - 34.0 pg Final  . MCHC 04/28/2019 33.6  30.0 - 36.0 g/dL Final  . RDW 04/28/2019 12.4  11.5 - 15.5 % Final  . Platelets 04/28/2019 353  150 - 400 K/uL Final  . nRBC 04/28/2019 0.0  0.0 - 0.2 % Final  . Neutrophils Relative % 04/28/2019 67  % Final  . Neutro Abs 04/28/2019 2.4  1.7 - 7.7 K/uL Final  . Lymphocytes Relative 04/28/2019 24  % Final  . Lymphs Abs 04/28/2019 0.9  0.7 - 4.0 K/uL Final  . Monocytes Relative 04/28/2019 6  % Final  . Monocytes Absolute 04/28/2019 0.2  0.1 - 1.0 K/uL Final  . Eosinophils Relative 04/28/2019 1  % Final  . Eosinophils Absolute 04/28/2019 0.0  0.0 - 0.5 K/uL Final  . Basophils Relative 04/28/2019 1  % Final  . Basophils Absolute 04/28/2019 0.1  0.0 - 0.1 K/uL Final  . Immature Granulocytes 04/28/2019 1  % Final  . Abs Immature Granulocytes 04/28/2019 0.02  0.00 - 0.07 K/uL Final   Performed at [facility], [place] [address] [place], [place] [address]  . Sodium 04/28/2019 131* 135 - 145 mmol/L Final  . Potassium 04/28/2019 4.1  3.5 - 5.1 mmol/L Final  . Chloride 04/28/2019 98  98 - 111 mmol/L Final  . CO2 04/28/2019 26  22 - 32 mmol/L Final  . Glucose, Bld 04/28/2019 97  70 - 99 mg/dL Final  . BUN 04/28/2019 18  6 - 20 mg/dL Final  . Creatinine, Ser 04/28/2019 0.78  0.44 - 1.00 mg/dL Final  . Calcium 04/28/2019 8.8* 8.9 - 10.3 mg/dL Final  . Total Protein 04/28/2019 6.7  6.5 - 8.1 g/dL Final  . Albumin 04/28/2019 3.7  3.5 - 5.0 g/dL Final  . AST 04/28/2019 16  15 - 41 U/L Final  . ALT 04/28/2019 15  0 - 44 U/L  Final  . Alkaline Phosphatase 04/28/2019 44  38 - 126 U/L Final  . Total Bilirubin 04/28/2019 0.2* 0.3 - 1.2 mg/dL Final  . GFR calc non Af Amer 04/28/2019 >60  >60 mL/min Final  . GFR calc Af Amer 04/28/2019 >60  >60 mL/min Final  . Anion gap 04/28/2019 7  5 - 15 Final   Performed at [facility], [place] [address], [place], [place] [address]    (this displays the last labs from the last 3 days)  No results found for: TOTALPROTELP, ALBUMINELP, A1GS, A2GS, BETS, BETA2SER, GAMS, MSPIKE, SPEI (this displays SPEP labs)  No results found for: KPAFRELGTCHN, LAMBDASER, KAPLAMBRATIO (kappa/lambda light chains)  No results found for: HGBA, HGBA2QUANT, HGBFQUANT, HGBSQUAN (Hemoglobinopathy evaluation)   No results found for: LDH  No results found for: IRON, TIBC, IRONPCTSAT (Iron and TIBC)  No results found for: FERRITIN  Urinalysis No results found for: COLORURINE, APPEARANCEUR, LABSPEC, PHURINE, GLUCOSEU, HGBUR, BILIRUBINUR, KETONESUR, PROTEINUR, UROBILINOGEN, NITRITE, LEUKOCYTESUR   STUDIES: No results found.  ELIGIBLE FOR AVAILABLE RESEARCH PROTOCOL: no  ASSESSMENT: 58 y.o. [place] woman status post left breast biopsy 02/12/2019 for a clinical T1b N0, stage IA invasive ductal carcinoma, estrogen receptor positive, progesterone receptor negative, HER-2 amplified, with an MIB-1-1 of 25%  (1) status post left lumpectomy and sentinel lymph node sampling 03/05/2019 for a pT1c pN0, stage IA invasive ductal carcinoma, grade 2, with positive lymphovascular invasion but negative margins  (  a) a total of 8 lymph nodes removed (4 sentinel)   (2) adjuvant chemotherapy with paclitaxel and trastuzumab weekly x12 started 04/07/2019  (a) paclitaxel stopped after 4 doses because of peripheral neuropathy  (b) cyclophosphamide, methotrexate, fluorouracil started 05/12/2019, to be repeated q. 21 days x 3  (3) trastuzumab to be continued to complete 1 year  (a) echo  02/26/2019 shows an ejection fraction in the 60-65% range  (4) adjuvant radiation to follow  (5) antiestrogens to start at the completion of local treatment  (6) genetics testing 02/25/2019 through the Multi-Gene Panel offered by [facility] found no deleterious mutations in AIP, ALK, APC, ATM, AXIN2,BAP1,  BARD1, BLM, BMPR1A, BRCA1, BRCA2, BRIP1, CASR, CDC73, CDH1, CDK4, CDKN1B, CDKN1C, CDKN2A (p14ARF), CDKN2A (p16INK4a), CEBPA, CHEK2, CTNNA1, DICER1, DIS3L2, EGFR (c.2369C>T, p.Thr790Met variant only), EPCAM (Deletion/duplication testing only), FH, FLCN, GATA2, GPC3, GREM1 (Promoter region deletion/duplication testing only), HOXB13 (c.251G>A, p.Gly84Glu), HRAS, KIT, MAX, MEN1, MET, MITF (c.952G>A, p.Glu318Lys variant only), MLH1, MSH2, MSH3, MSH6, MUTYH, NBN, NF1, NF2, NTHL1, PALB2, PDGFRA, PHOX2B, PMS2, POLD1, POLE, POT1, PRKAR1A, PTCH1, PTEN, RAD50, RAD51C, RAD51D, RB1, RECQL4, RET, RNF43, RUNX1, SDHAF2, SDHA (sequence changes only), SDHB, SDHC, SDHD, SMAD4, SMARCA4, SMARCB1, SMARCE1, STK11, SUFU, TERC, TERT, TMEM127, TP53, TSC1, TSC2, VHL, WRN and WT1.      PLAN: Romie's neuropathy is better and minimal but it is not resolved.  We cannot continue with paclitaxel.  I had proposed carboplatin and Gemzar, which is very effective in stage IV disease.  Her insurance is not comfortable with this but did agree to CMF as well as doxorubicin-based treatment.  The latter of course is exactly what we do not want here.  I discussed CMF in detail with Vinnie Level.  This is an effective treatment.  It is generally very well-tolerated.  It is more chemo rather than less chemo than I wanted to give her.  I hope this reassures her that she is receiving adequate treatment which I do believe she has.  The original plan was for 12 weeks of chemotherapy and she has already had 4.  Accordingly she will have 3 cycles of CMF beginning today.  We discussed the possible toxicities side effects and complications in detail and  she will see me again in 1 week to make sure she tolerated this well.  If she wishes she can start her adjuvant radiation earlier and I will let Dr. Sondra Come know that  She knows to call for any other issue that may develop before the next visit.     Zykeria Laguardia, Virgie Dad, MD  05/12/19 11:19 AM Medical Oncology and Hematology [facility] [place], [place] [address] Tel. [phone]    Fax. [phone]  I, Jacqualyn Posey am acting as a Education administrator for Chauncey Cruel, MD.   I, [name] MD, have reviewed the above documentation for accuracy and completeness, and I agree with the above.

## 2019-05-11 NOTE — Telephone Encounter (Signed)
Called [employer] about Allison Hanna, and her pending chemotherapy status.  I reviewed with the nurse reviewer that we would really like to keep patient on schedule with her adjuvant treatment.  She noted that they received additional clinical information requested this morning and that was sent over to the physician reviewer.  She could not give me at time as to when chemo authorization will be completed.    Wilber Bihari, NP

## 2019-05-12 ENCOUNTER — Other Ambulatory Visit: Payer: Self-pay

## 2019-05-12 ENCOUNTER — Other Ambulatory Visit: Payer: Managed Care, Other (non HMO)

## 2019-05-12 ENCOUNTER — Inpatient Hospital Stay: Payer: Managed Care, Other (non HMO)

## 2019-05-12 ENCOUNTER — Inpatient Hospital Stay (HOSPITAL_BASED_OUTPATIENT_CLINIC_OR_DEPARTMENT_OTHER): Payer: Managed Care, Other (non HMO) | Admitting: Oncology

## 2019-05-12 VITALS — BP 150/82 | HR 66 | Temp 98.2°F | Resp 18 | Ht 66.0 in | Wt 157.2 lb

## 2019-05-12 DIAGNOSIS — Z95828 Presence of other vascular implants and grafts: Secondary | ICD-10-CM

## 2019-05-12 DIAGNOSIS — Z5112 Encounter for antineoplastic immunotherapy: Secondary | ICD-10-CM | POA: Diagnosis not present

## 2019-05-12 DIAGNOSIS — Z170 Estrogen receptor positive status [ER+]: Secondary | ICD-10-CM

## 2019-05-12 DIAGNOSIS — C50512 Malignant neoplasm of lower-outer quadrant of left female breast: Secondary | ICD-10-CM | POA: Diagnosis not present

## 2019-05-12 LAB — CMP (CANCER CENTER ONLY)
ALT: 16 U/L (ref 0–44)
AST: 19 U/L (ref 15–41)
Albumin: 3.9 g/dL (ref 3.5–5.0)
Alkaline Phosphatase: 52 U/L (ref 38–126)
Anion gap: 6 (ref 5–15)
BUN: 19 mg/dL (ref 6–20)
CO2: 27 mmol/L (ref 22–32)
Calcium: 9.2 mg/dL (ref 8.9–10.3)
Chloride: 102 mmol/L (ref 98–111)
Creatinine: 0.81 mg/dL (ref 0.44–1.00)
GFR, Est AFR Am: >60 mL/min (ref >60)
GFR, Estimated: >60 mL/min (ref >60)
Glucose, Bld: 97 mg/dL (ref 70–99)
Potassium: 4.2 mmol/L (ref 3.5–5.1)
Sodium: 135 mmol/L (ref 135–145)
Total Bilirubin: 0.3 mg/dL (ref 0.3–1.2)
Total Protein: 7 g/dL (ref 6.5–8.1)

## 2019-05-12 LAB — CBC WITH DIFFERENTIAL (CANCER CENTER ONLY)
Abs Immature Granulocytes: 0.02 10*3/uL (ref 0.00–0.07)
Basophils Absolute: 0.1 10*3/uL (ref 0.0–0.1)
Basophils Relative: 2 %
Eosinophils Absolute: 0.1 10*3/uL (ref 0.0–0.5)
Eosinophils Relative: 2 %
HCT: 33.9 % — ABNORMAL LOW (ref 36.0–46.0)
Hemoglobin: 11.3 g/dL — ABNORMAL LOW (ref 12.0–15.0)
Immature Granulocytes: 0 %
Lymphocytes Relative: 17 %
Lymphs Abs: 0.9 10*3/uL (ref 0.7–4.0)
MCH: 30.9 pg (ref 26.0–34.0)
MCHC: 33.3 g/dL (ref 30.0–36.0)
MCV: 92.6 fL (ref 80.0–100.0)
Monocytes Absolute: 0.6 10*3/uL (ref 0.1–1.0)
Monocytes Relative: 12 %
Neutro Abs: 3.3 10*3/uL (ref 1.7–7.7)
Neutrophils Relative %: 67 %
Platelet Count: 240 10*3/uL (ref 150–400)
RBC: 3.66 MIL/uL — ABNORMAL LOW (ref 3.87–5.11)
RDW: 13.9 % (ref 11.5–15.5)
WBC Count: 5 10*3/uL (ref 4.0–10.5)
nRBC: 0 % (ref 0.0–0.2)

## 2019-05-12 LAB — SEDIMENTATION RATE: Sed Rate: 24 mm/hr — ABNORMAL HIGH (ref 0–22)

## 2019-05-12 MED ORDER — ACETAMINOPHEN 325 MG PO TABS
650.0000 mg | ORAL_TABLET | Freq: Once | ORAL | Status: AC
  Administered 2019-05-12: 650 mg via ORAL

## 2019-05-12 MED ORDER — DEXAMETHASONE SODIUM PHOSPHATE 10 MG/ML IJ SOLN
INTRAMUSCULAR | Status: AC
  Filled 2019-05-12: qty 1

## 2019-05-12 MED ORDER — DIPHENHYDRAMINE HCL 25 MG PO CAPS
25.0000 mg | ORAL_CAPSULE | Freq: Once | ORAL | Status: AC
  Administered 2019-05-12: 13:00:00 25 mg via ORAL

## 2019-05-12 MED ORDER — SODIUM CHLORIDE 0.9% FLUSH
10.0000 mL | INTRAVENOUS | Status: DC | PRN
  Administered 2019-05-12: 10 mL
  Filled 2019-05-12: qty 10

## 2019-05-12 MED ORDER — ACETAMINOPHEN 325 MG PO TABS
ORAL_TABLET | ORAL | Status: AC
  Filled 2019-05-12: qty 2

## 2019-05-12 MED ORDER — HEPARIN SOD (PORK) LOCK FLUSH 100 UNIT/ML IV SOLN
500.0000 [IU] | Freq: Once | INTRAVENOUS | Status: AC | PRN
  Administered 2019-05-12: 500 [IU]
  Filled 2019-05-12: qty 5

## 2019-05-12 MED ORDER — SODIUM CHLORIDE 0.9 % IV SOLN
Freq: Once | INTRAVENOUS | Status: AC
  Administered 2019-05-12: 13:00:00 via INTRAVENOUS
  Filled 2019-05-12: qty 250

## 2019-05-12 MED ORDER — SODIUM CHLORIDE 0.9 % IV SOLN
600.0000 mg/m2 | Freq: Once | INTRAVENOUS | Status: AC
  Administered 2019-05-12: 1060 mg via INTRAVENOUS
  Filled 2019-05-12: qty 53

## 2019-05-12 MED ORDER — DIPHENHYDRAMINE HCL 25 MG PO CAPS
ORAL_CAPSULE | ORAL | Status: AC
  Filled 2019-05-12: qty 1

## 2019-05-12 MED ORDER — PALONOSETRON HCL INJECTION 0.25 MG/5ML
INTRAVENOUS | Status: AC
  Filled 2019-05-12: qty 5

## 2019-05-12 MED ORDER — METHOTREXATE SODIUM (PF) CHEMO INJECTION 250 MG/10ML
39.3000 mg/m2 | Freq: Once | INTRAMUSCULAR | Status: AC
  Administered 2019-05-12: 15:00:00 70 mg via INTRAVENOUS
  Filled 2019-05-12: qty 2.8

## 2019-05-12 MED ORDER — TRASTUZUMAB-DKST CHEMO 150 MG IV SOLR
450.0000 mg | Freq: Once | INTRAVENOUS | Status: AC
  Administered 2019-05-12: 14:00:00 450 mg via INTRAVENOUS
  Filled 2019-05-12: qty 21.43

## 2019-05-12 MED ORDER — PALONOSETRON HCL INJECTION 0.25 MG/5ML
0.2500 mg | Freq: Once | INTRAVENOUS | Status: AC
  Administered 2019-05-12: 0.25 mg via INTRAVENOUS

## 2019-05-12 MED ORDER — SODIUM CHLORIDE 0.9 % IV SOLN: 10.0000 mg | Freq: Once | INTRAVENOUS | Status: DC

## 2019-05-12 MED ORDER — DEXAMETHASONE SODIUM PHOSPHATE 10 MG/ML IJ SOLN
10.0000 mg | Freq: Once | INTRAMUSCULAR | Status: AC
  Administered 2019-05-12: 10 mg via INTRAVENOUS

## 2019-05-12 MED ORDER — FLUOROURACIL CHEMO INJECTION 2.5 GM/50ML
600.0000 mg/m2 | Freq: Once | INTRAVENOUS | Status: AC
  Administered 2019-05-12: 15:00:00 1050 mg via INTRAVENOUS
  Filled 2019-05-12: qty 21

## 2019-05-12 NOTE — Patient Instructions (Signed)
[facility] Discharge Instructions for Patients Receiving Chemotherapy  Today you received the following chemotherapy agents: trastuzumab, cyclophosphamide, methotrexate, and flourouracil.   To help prevent nausea and vomiting after your treatment, we encourage you to take your nausea medication as prescribed by your physician.    If you develop nausea and vomiting that is not controlled by your nausea medication, call the clinic.   BELOW ARE SYMPTOMS THAT SHOULD BE REPORTED IMMEDIATELY:  *FEVER GREATER THAN 100.5 F  *CHILLS WITH OR WITHOUT FEVER  NAUSEA AND VOMITING THAT IS NOT CONTROLLED WITH YOUR NAUSEA MEDICATION  *UNUSUAL SHORTNESS OF BREATH  *UNUSUAL BRUISING OR BLEEDING  TENDERNESS IN MOUTH AND THROAT WITH OR WITHOUT PRESENCE OF ULCERS  *URINARY PROBLEMS  *BOWEL PROBLEMS  UNUSUAL RASH Items with * indicate a potential emergency and should be followed up as soon as possible.  Feel free to call the clinic should you have any questions or concerns. The clinic phone number is [phone].  Please show the [facility] at check-in to the Emergency Department and triage nurse.  Methotrexate injection What is this medicine? METHOTREXATE (METH oh TREX ate) is a chemotherapy drug used to treat cancer including breast cancer, leukemia, and lymphoma. This medicine can also be used to treat psoriasis and certain kinds of arthritis. This medicine may be used for other purposes; ask your health care provider or pharmacist if you have questions. What should I tell my health care provider before I take this medicine? They need to know if you have any of these conditions:  fluid in the stomach area or lungs  if you often drink alcohol  infection or immune system problems  kidney disease  liver disease  low blood counts, like low white cell, platelet, or red cell counts  lung disease  radiation therapy  stomach ulcers  ulcerative colitis  an  unusual or allergic reaction to methotrexate, other medicines, foods, dyes, or preservatives  pregnant or trying to get pregnant  breast-feeding How should I use this medicine? This medicine is for infusion into a vein or for injection into muscle or into the spinal fluid (whichever applies). It is usually given by a health care professional in a hospital or clinic setting. In rare cases, you might get this medicine at home. You will be taught how to give this medicine. Use exactly as directed. Take your medicine at regular intervals. Do not take your medicine more often than directed. If this medicine is used for arthritis or psoriasis, it should be taken weekly, NOT daily. It is important that you put your used needles and syringes in a special sharps container. Do not put them in a trash can. If you do not have a sharps container, call your pharmacist or healthcare provider to get one. Talk to your pediatrician regarding the use of this medicine in children. While this drug may be prescribed for children as young as 2 years for selected conditions, precautions do apply. Overdosage: If you think you have taken too much of this medicine contact a poison control center or emergency room at once. NOTE: This medicine is only for you. Do not share this medicine with others. What if I miss a dose? It is important not to miss your dose. Call your doctor or health care professional if you are unable to keep an appointment. If you give yourself the medicine and you miss a dose, talk with your doctor or health care professional. Do not take double or extra  doses. What may interact with this medicine? This medicine may interact with the following medications:  acitretin  aspirin or aspirin-like medicines including salicylates  azathioprine  certain antibiotics like chloramphenicol, penicillin, tetracycline  certain medicines for stomach problems like esomeprazole, omeprazole,  pantoprazole  cyclosporine  gold  hydroxychloroquine  live virus vaccines  mercaptopurine  NSAIDs, medicines for pain and inflammation, like ibuprofen or naproxen  other cytotoxic agents  penicillamine  phenylbutazone  phenytoin  probenacid  retinoids such as isotretinoin and tretinoin  steroid medicines like prednisone or cortisone  sulfonamides like sulfasalazine and trimethoprim/sulfamethoxazole  theophylline This list may not describe all possible interactions. Give your health care provider a list of all the medicines, herbs, non-prescription drugs, or dietary supplements you use. Also tell them if you smoke, drink alcohol, or use illegal drugs. Some items may interact with your medicine. What should I watch for while using this medicine? Avoid alcoholic drinks. In some cases, you may be given additional medicines to help with side effects. Follow all directions for their use. This medicine can make you more sensitive to the sun. Keep out of the sun. If you cannot avoid being in the sun, wear protective clothing and use sunscreen. Do not use sun lamps or tanning beds/booths. You may get drowsy or dizzy. Do not drive, use machinery, or do anything that needs mental alertness until you know how this medicine affects you. Do not stand or sit up quickly, especially if you are an older patient. This reduces the risk of dizzy or fainting spells. You may need blood work done while you are taking this medicine. Call your doctor or health care professional for advice if you get a fever, chills or sore throat, or other symptoms of a cold or flu. Do not treat yourself. This drug decreases your body's ability to fight infections. Try to avoid being around people who are sick. This medicine may increase your risk to bruise or bleed. Call your doctor or health care professional if you notice any unusual bleeding. Check with your doctor or health care professional if you get an attack  of severe diarrhea, nausea and vomiting, or if you sweat a lot. The loss of too much body fluid can make it dangerous for you to take this medicine. Talk to your doctor about your risk of cancer. You may be more at risk for certain types of cancers if you take this medicine. Both men and women must use effective birth control with this medicine. Do not become pregnant while taking this medicine or until at least 1 normal menstrual cycle has occurred after stopping it. Women should inform their doctor if they wish to become pregnant or think they might be pregnant. Men should not father a child while taking this medicine and for 3 months after stopping it. There is a potential for serious side effects to an unborn child. Talk to your health care professional or pharmacist for more information. Do not breast-feed an infant while taking this medicine. What side effects may I notice from receiving this medicine? Side effects that you should report to your doctor or health care professional as soon as possible:  allergic reactions like skin rash, itching or hives, swelling of the face, lips, or tongue  back pain  breathing problems or shortness of breath  confusion  diarrhea  dry, nonproductive cough  low blood counts - this medicine may decrease the number of white blood cells, red blood cells and platelets. You may be at  increased risk of infections and bleeding  mouth sores  redness, blistering, peeling or loosening of the skin, including inside the mouth  seizures  severe headaches  signs of infection - fever or chills, cough, sore throat, pain or difficulty passing urine  signs and symptoms of bleeding such as bloody or black, tarry stools; red or dark-brown urine; spitting up blood or brown material that looks like coffee grounds; red spots on the skin; unusual bruising or bleeding from the eye, gums, or nose  signs and symptoms of kidney injury like trouble passing urine or change  in the amount of urine  signs and symptoms of liver injury like dark yellow or brown urine; general ill feeling or flu-like symptoms; light-colored stools; loss of appetite; nausea; right upper belly pain; unusually weak or tired; yellowing of the eyes or skin  stiff neck  vomiting Side effects that usually do not require medical attention (report to your doctor or health care professional if they continue or are bothersome):  dizziness  hair loss  headache  stomach pain  upset stomach This list may not describe all possible side effects. Call your doctor for medical advice about side effects. You may report side effects to FDA at 1-800-FDA-1088. Where should I keep my medicine? If you are using this medicine at home, you will be instructed on how to store this medicine. Throw away any unused medicine after the expiration date on the label. NOTE: This sheet is a summary. It may not cover all possible information. If you have questions about this medicine, talk to your doctor, pharmacist, or health care provider.  2020 Elsevier/Gold Standard (2017-04-04 13:31:42)  Cyclophosphamide injection What is this medicine? CYCLOPHOSPHAMIDE (sye kloe FOSS fa mide) is a chemotherapy drug. It slows the growth of cancer cells. This medicine is used to treat many types of cancer like lymphoma, myeloma, leukemia, breast cancer, and ovarian cancer, to name a few. This medicine may be used for other purposes; ask your health care provider or pharmacist if you have questions. COMMON BRAND NAME(S): Cytoxan, Neosar What should I tell my health care provider before I take this medicine? They need to know if you have any of these conditions:  blood disorders  history of other chemotherapy  infection  kidney disease  liver disease  recent or ongoing radiation therapy  tumors in the bone marrow  an unusual or allergic reaction to cyclophosphamide, other chemotherapy, other medicines, foods,  dyes, or preservatives  pregnant or trying to get pregnant  breast-feeding How should I use this medicine? This drug is usually given as an injection into a vein or muscle or by infusion into a vein. It is administered in a hospital or clinic by a specially trained health care professional. Talk to your pediatrician regarding the use of this medicine in children. Special care may be needed. Overdosage: If you think you have taken too much of this medicine contact a poison control center or emergency room at once. NOTE: This medicine is only for you. Do not share this medicine with others. What if I miss a dose? It is important not to miss your dose. Call your doctor or health care professional if you are unable to keep an appointment. What may interact with this medicine? This medicine may interact with the following medications:  amiodarone  amphotericin B  azathioprine  certain antiviral medicines for HIV or AIDS such as protease inhibitors (e.g., indinavir, ritonavir) and zidovudine  certain blood pressure medications such as benazepril,  captopril, enalapril, fosinopril, lisinopril, moexipril, monopril, perindopril, quinapril, ramipril, trandolapril  certain cancer medications such as anthracyclines (e.g., daunorubicin, doxorubicin), busulfan, cytarabine, paclitaxel, pentostatin, tamoxifen, trastuzumab  certain diuretics such as chlorothiazide, chlorthalidone, hydrochlorothiazide, indapamide, metolazone  certain medicines that treat or prevent blood clots like warfarin  certain muscle relaxants such as succinylcholine  cyclosporine  etanercept  indomethacin  medicines to increase blood counts like filgrastim, pegfilgrastim, sargramostim  medicines used as general anesthesia  metronidazole  natalizumab This list may not describe all possible interactions. Give your health care provider a list of all the medicines, herbs, non-prescription drugs, or dietary supplements  you use. Also tell them if you smoke, drink alcohol, or use illegal drugs. Some items may interact with your medicine. What should I watch for while using this medicine? Visit your doctor for checks on your progress. This drug may make you feel generally unwell. This is not uncommon, as chemotherapy can affect healthy cells as well as cancer cells. Report any side effects. Continue your course of treatment even though you feel ill unless your doctor tells you to stop. Drink water or other fluids as directed. Urinate often, even at night. In some cases, you may be given additional medicines to help with side effects. Follow all directions for their use. Call your doctor or health care professional for advice if you get a fever, chills or sore throat, or other symptoms of a cold or flu. Do not treat yourself. This drug decreases your body's ability to fight infections. Try to avoid being around people who are sick. This medicine may increase your risk to bruise or bleed. Call your doctor or health care professional if you notice any unusual bleeding. Be careful brushing and flossing your teeth or using a toothpick because you may get an infection or bleed more easily. If you have any dental work done, tell your dentist you are receiving this medicine. You may get drowsy or dizzy. Do not drive, use machinery, or do anything that needs mental alertness until you know how this medicine affects you. Do not become pregnant while taking this medicine or for 1 year after stopping it. Women should inform their doctor if they wish to become pregnant or think they might be pregnant. Men should not father a child while taking this medicine and for 4 months after stopping it. There is a potential for serious side effects to an unborn child. Talk to your health care professional or pharmacist for more information. Do not breast-feed an infant while taking this medicine. This medicine may interfere with the ability to  have a child. This medicine has caused ovarian failure in some women. This medicine has caused reduced sperm counts in some men. You should talk with your doctor or health care professional if you are concerned about your fertility. If you are going to have surgery, tell your doctor or health care professional that you have taken this medicine. What side effects may I notice from receiving this medicine? Side effects that you should report to your doctor or health care professional as soon as possible:  allergic reactions like skin rash, itching or hives, swelling of the face, lips, or tongue  low blood counts - this medicine may decrease the number of white blood cells, red blood cells and platelets. You may be at increased risk for infections and bleeding.  signs of infection - fever or chills, cough, sore throat, pain or difficulty passing urine  signs of decreased platelets or bleeding - bruising,  pinpoint red spots on the skin, black, tarry stools, blood in the urine  signs of decreased red blood cells - unusually weak or tired, fainting spells, lightheadedness  breathing problems  dark urine  dizziness  palpitations  swelling of the ankles, feet, hands  trouble passing urine or change in the amount of urine  weight gain  yellowing of the eyes or skin Side effects that usually do not require medical attention (report to your doctor or health care professional if they continue or are bothersome):  changes in nail or skin color  hair loss  missed menstrual periods  mouth sores  nausea, vomiting This list may not describe all possible side effects. Call your doctor for medical advice about side effects. You may report side effects to FDA at 1-800-FDA-1088. Where should I keep my medicine? This drug is given in a hospital or clinic and will not be stored at home. NOTE: This sheet is a summary. It may not cover all possible information. If you have questions about this  medicine, talk to your doctor, pharmacist, or health care provider.  2020 Elsevier/Gold Standard (2012-06-27 16:22:58)  Fluorouracil, 5-FU injection What is this medicine? FLUOROURACIL, 5-FU (flure oh YOOR a sil) is a chemotherapy drug. It slows the growth of cancer cells. This medicine is used to treat many types of cancer like breast cancer, colon or rectal cancer, pancreatic cancer, and stomach cancer. This medicine may be used for other purposes; ask your health care provider or pharmacist if you have questions. COMMON BRAND NAME(S): Adrucil What should I tell my health care provider before I take this medicine? They need to know if you have any of these conditions:  blood disorders  dihydropyrimidine dehydrogenase (DPD) deficiency  infection (especially a virus infection such as chickenpox, cold sores, or herpes)  kidney disease  liver disease  malnourished, poor nutrition  recent or ongoing radiation therapy  an unusual or allergic reaction to fluorouracil, other chemotherapy, other medicines, foods, dyes, or preservatives  pregnant or trying to get pregnant  breast-feeding How should I use this medicine? This drug is given as an infusion or injection into a vein. It is administered in a hospital or clinic by a specially trained health care professional. Talk to your pediatrician regarding the use of this medicine in children. Special care may be needed. Overdosage: If you think you have taken too much of this medicine contact a poison control center or emergency room at once. NOTE: This medicine is only for you. Do not share this medicine with others. What if I miss a dose? It is important not to miss your dose. Call your doctor or health care professional if you are unable to keep an appointment. What may interact with this medicine?  allopurinol  cimetidine  dapsone  digoxin  hydroxyurea  leucovorin  levamisole  medicines for seizures like ethotoin,  fosphenytoin, phenytoin  medicines to increase blood counts like filgrastim, pegfilgrastim, sargramostim  medicines that treat or prevent blood clots like warfarin, enoxaparin, and dalteparin  methotrexate  metronidazole  pyrimethamine  some other chemotherapy drugs like busulfan, cisplatin, estramustine, vinblastine  trimethoprim  trimetrexate  vaccines Talk to your doctor or health care professional before taking any of these medicines:  acetaminophen  aspirin  ibuprofen  ketoprofen  naproxen This list may not describe all possible interactions. Give your health care provider a list of all the medicines, herbs, non-prescription drugs, or dietary supplements you use. Also tell them if you smoke, drink alcohol, or  use illegal drugs. Some items may interact with your medicine. What should I watch for while using this medicine? Visit your doctor for checks on your progress. This drug may make you feel generally unwell. This is not uncommon, as chemotherapy can affect healthy cells as well as cancer cells. Report any side effects. Continue your course of treatment even though you feel ill unless your doctor tells you to stop. In some cases, you may be given additional medicines to help with side effects. Follow all directions for their use. Call your doctor or health care professional for advice if you get a fever, chills or sore throat, or other symptoms of a cold or flu. Do not treat yourself. This drug decreases your body's ability to fight infections. Try to avoid being around people who are sick. This medicine may increase your risk to bruise or bleed. Call your doctor or health care professional if you notice any unusual bleeding. Be careful brushing and flossing your teeth or using a toothpick because you may get an infection or bleed more easily. If you have any dental work done, tell your dentist you are receiving this medicine. Avoid taking products that contain aspirin,  acetaminophen, ibuprofen, naproxen, or ketoprofen unless instructed by your doctor. These medicines may hide a fever. Do not become pregnant while taking this medicine. Women should inform their doctor if they wish to become pregnant or think they might be pregnant. There is a potential for serious side effects to an unborn child. Talk to your health care professional or pharmacist for more information. Do not breast-feed an infant while taking this medicine. Men should inform their doctor if they wish to father a child. This medicine may lower sperm counts. Do not treat diarrhea with over the counter products. Contact your doctor if you have diarrhea that lasts more than 2 days or if it is severe and watery. This medicine can make you more sensitive to the sun. Keep out of the sun. If you cannot avoid being in the sun, wear protective clothing and use sunscreen. Do not use sun lamps or tanning beds/booths. What side effects may I notice from receiving this medicine? Side effects that you should report to your doctor or health care professional as soon as possible:  allergic reactions like skin rash, itching or hives, swelling of the face, lips, or tongue  low blood counts - this medicine may decrease the number of white blood cells, red blood cells and platelets. You may be at increased risk for infections and bleeding.  signs of infection - fever or chills, cough, sore throat, pain or difficulty passing urine  signs of decreased platelets or bleeding - bruising, pinpoint red spots on the skin, black, tarry stools, blood in the urine  signs of decreased red blood cells - unusually weak or tired, fainting spells, lightheadedness  breathing problems  changes in vision  chest pain  mouth sores  nausea and vomiting  pain, swelling, redness at site where injected  pain, tingling, numbness in the hands or feet  redness, swelling, or sores on hands or feet  stomach pain  unusual  bleeding Side effects that usually do not require medical attention (report to your doctor or health care professional if they continue or are bothersome):  changes in finger or toe nails  diarrhea  dry or itchy skin  hair loss  headache  loss of appetite  sensitivity of eyes to the light  stomach upset  unusually teary eyes This list  may not describe all possible side effects. Call your doctor for medical advice about side effects. You may report side effects to FDA at 1-800-FDA-1088. Where should I keep my medicine? This drug is given in a hospital or clinic and will not be stored at home. NOTE: This sheet is a summary. It may not cover all possible information. If you have questions about this medicine, talk to your doctor, pharmacist, or health care provider.  2020 Elsevier/Gold Standard (2007-12-17 13:53:16)

## 2019-05-12 NOTE — Progress Notes (Signed)
DISCONTINUE OFF PATHWAY REGIMEN - Breast   [id number]:Gemcitabine + Carboplatin (1000/2) q21 Days:   A cycle is every 21 days:     Gemcitabine      Carboplatin   **Always confirm dose/schedule in your pharmacy ordering system**  REASON: Toxicities / Adverse Event PRIOR TREATMENT: Off Pathway: Gemcitabine + Carboplatin (1000/2) q21 Days TREATMENT RESPONSE: Unable to Evaluate  START OFF PATHWAY REGIMEN - Breast   [id number]:CMF (IV cyclophosphamide) q21 days:   A cycle is every 21 days:     Cyclophosphamide      Methotrexate      Fluorouracil   **Always confirm dose/schedule in your pharmacy ordering system**  Patient Characteristics: Postoperative without Neoadjuvant Therapy (Pathologic Staging), Invasive Disease, Adjuvant Therapy, HER2 Positive, ER Positive, 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

## 2019-05-13 ENCOUNTER — Telehealth: Payer: Self-pay | Admitting: *Deleted

## 2019-05-13 LAB — IGG, IGA, IGM
IgA: 194 mg/dL (ref 87–352)
IgG (Immunoglobin G), Serum: 1078 mg/dL (ref 586–1602)
IgM (Immunoglobulin M), Srm: 21 mg/dL — ABNORMAL LOW (ref 26–217)

## 2019-05-13 LAB — FANA STAINING PATTERNS
Speckled Pattern: 1:160 {titer} — ABNORMAL HIGH
Spindle Apparatus Pattern: 1:160 {titer} — ABNORMAL HIGH

## 2019-05-13 LAB — ANTINUCLEAR ANTIBODIES, IFA: ANA Ab, IFA: POSITIVE — AB

## 2019-05-15 ENCOUNTER — Telehealth: Payer: Self-pay | Admitting: *Deleted

## 2019-05-15 MED ORDER — ONDANSETRON HCL 8 MG PO TABS: 8.0000 mg | ORAL_TABLET | Freq: Three times a day (TID) | ORAL | 3 refills | Status: DC | PRN

## 2019-05-15 NOTE — Telephone Encounter (Signed)
This RN returned VM left by the patient stating nausea not relieved with compazine.  Allison Hanna had CMF with trastuzumab on 9/15 with Aloxi IV and compazine for home use.  Allison Hanna states she noted nausea during the night- and upon waking- took compazine without good benefit.  She is having dry heaves and is able to hydrate and keep fluid down " just don't feel like drinking "  Per above and review with pharmacy - pt may use zofran.  Of note due to break through nausea unrelieved with compazine- Emend order will be entered for authorization and possible use with next chemo cycle.  This RN sent zofran to pharmacy and verified per call that it did not need prior authorization.  This RN called pt and discussed above including benefits and side effects of medication.  No further needs at this time.  This message will be sent to MD for review of communication.

## 2019-05-18 ENCOUNTER — Other Ambulatory Visit: Payer: Self-pay | Admitting: Oncology

## 2019-05-18 ENCOUNTER — Other Ambulatory Visit: Payer: Self-pay | Admitting: *Deleted

## 2019-05-18 DIAGNOSIS — C50512 Malignant neoplasm of lower-outer quadrant of left female breast: Secondary | ICD-10-CM

## 2019-05-19 ENCOUNTER — Other Ambulatory Visit: Payer: Managed Care, Other (non HMO)

## 2019-05-19 ENCOUNTER — Ambulatory Visit: Payer: Managed Care, Other (non HMO)

## 2019-05-19 NOTE — Progress Notes (Signed)
[place]  Telephone:([phone] [phone]     ID: Allison Hanna DOB: 01/22/1961  MR#: [id number]  ZSW#:[id number]  Patient Care Team: Merrilee Seashore, MD as PCP - General (Internal Medicine) Mauro Kaufmann, RN as Oncology Nurse Navigator Rockwell Germany, RN as Oncology Nurse Navigator Shaketa Serafin, Virgie Dad, MD as Consulting Physician (Oncology) Gery Pray, MD as Consulting Physician (Radiation Oncology) Fanny Skates, MD as Consulting Physician (General Surgery) Brien Few, MD as Consulting Physician (Obstetrics and Gynecology) Larey Dresser, MD as Consulting Physician (Cardiology) Feliberto Harts R (Inactive) Chauncey Cruel, MD OTHER MD:   CHIEF COMPLAINT: Estrogen receptor positive breast cancer  CURRENT TREATMENT: Adjuvant chemotherapy   INTERVAL HISTORY: Allison Hanna returns today for follow-up and treatment of her estrogen receptor positive breast cancer. She was last seen here on 05/12/2019.   She continues on adjuvant trastuzumab (Ogivri).  She tolerates this well.  She is concerned about cardiac issues and tells me that her LDL is elevated and her father died from a massive heart attack at age 32 (he was not a smoker).  She was started on CMF 05/12/2019. Today is day 9 cycle 1 of 3 planned cycles given 21 days apart .  She had retching the night of treatment and then day 4.  She did not have this of course with the prior chemotherapy.  She had some fatigue as well.  Otherwise there were no new symptoms.  Allison Hanna's last echocardiogram on 02/26/2019, showed an ejection fraction in the 60% - 65% range.   Since her last visit here, she has not undergone any additional studies.     REVIEW OF SYSTEMS: Allison Hanna got some Zofran for the nausea and this have caused her to have some mild headaches.  She had minimal mouth sores and started Valtrex which took care of the problem.  Overall this was more difficult chemo for her but she is  feeling better today and is looking forward to the next 2 weeks when she will not be treated.  She is hoping to be able to play some tennis.  A detailed review of systems today was otherwise stable    HISTORY OF CURRENT ILLNESS: From the original intake note:  "Allison Hanna" presented with a palpable left breast mass. She initially experienced soreness to her left breast around April 2020 and tried to get a mammogram at this point, but due to pandemic concerns, the scan was delayed.  She did not actually feel a mass in the breast until June 1.  She underwent bilateral diagnostic mammography with tomography and left breast ultrasonography at [facility] on 02/11/2019 showing: breast density category B; irregular hyperechoic mass in the left breast at 4 o'clock 5 cm from the nipple measuring 8 mm; no enlarged adenopathy in the left axilla.  Accordingly on 02/12/2019 she proceeded to biopsy of the left breast area in question. The pathology from this procedure ([id number]) showed: invasive mammary carcinoma, e-cadherin positive. Prognostic indicators significant for: estrogen receptor, 100% positive with strong staining intensity and progesterone receptor, 0% negative. Proliferation marker Ki67 at 25%. HER2 positive by immunohistochemistry, (3+).  The patient's subsequent history is as detailed below.   PAST MEDICAL HISTORY: Past Medical History:  Diagnosis Date   Anxiety    Family history of breast cancer    Family history of melanoma    High cholesterol    Hypothyroid    Paralyzed vocal cords 2014   Raynauds syndrome    cold weather triggers fingers  to turn white  History of palpitations (resolved)   PAST SURGICAL HISTORY: Past Surgical History:  Procedure Laterality Date   BREAST LUMPECTOMY WITH RADIOACTIVE SEED AND SENTINEL LYMPH NODE BIOPSY Left 03/05/2019   Procedure: LEFT BREAST LUMPECTOMY WITH RADIOACTIVE SEED AND LEFT AXILLARY DEEP SENTINEL LYMPH NODE BIOPSY INJECT BLUE  DYE LEFT BREAST;  Surgeon: Fanny Skates, MD;  Location: [facility];  Service: General;  Laterality: Left;   KNEE SURGERY Bilateral    arthroscopy for menicus tears   PORTACATH PLACEMENT Right 03/05/2019   Procedure: INSERTION PORT-A-CATH;  Surgeon: Fanny Skates, MD;  Location: [facility];  Service: General;  Laterality: Right;   WRIST SURGERY      FAMILY HISTORY: Family History  Problem Relation Age of Onset   Breast cancer Mother 1   Cervical cancer Mother    Heart attack Father    Skin cancer Brother    Breast cancer Paternal Aunt        dx over 20s   Breast cancer Paternal Aunt        dx over 54   Uterine cancer Paternal Aunt 39   Cervical cancer Maternal Aunt    Liver cancer Paternal Uncle 25   Stroke Maternal Grandmother    Parkinson's disease Paternal Grandfather    Breast cancer Cousin 45       mother's maternal first cousin   Melanoma Cousin 32       pat first cousin  Patient's father was 76 years old when he died from heart attack. Patient's mother died at age 53. She was diagnosed with breast cancer at age 67. Two paternal aunts were diagnosed with breast cancer at older ages, one of which also had uterine cancer. She has 1 brother. He has a history of skin cancer.    GYNECOLOGIC HISTORY:  No LMP recorded. Patient is postmenopausal. Menarche: 58 years old Age at first live birth: 58 years old Hardy P 3 LMP 2007 Contraceptive: yes, pill for about 8 years, no complications HRT no  Hysterectomy? no BSO? 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 She is married. [name] is in charge of operations for [employer]. She lives at home with her husband. [name], age 65, lives in [place], [place] as a Research scientist (life sciences). [name], age 28, lives in [place] as a Careers adviser. [name], age 5, lives in [place]  as a Ship broker and works part-time in [employer]. She has no grandchildren.     ADVANCED DIRECTIVES: [name] is automatically her HCPOA.   HEALTH MAINTENANCE: Social History   Tobacco Use   Smoking status: Never Smoker   Smokeless tobacco: Never Used  Substance Use Topics   Alcohol use: Yes    Alcohol/week: 2.0 standard drinks    Types: 2 Standard drinks or equivalent per week   Drug use: Never     Colonoscopy: 2010  PAP: 08/2018  Bone density: 2019, osteopenia   No Known Allergies  Current Outpatient Medications  Medication Sig Dispense Refill   calcium carbonate (OS-CAL) 600 MG TABS tablet Take 1,200 mg by mouth 2 (two) times daily with a meal.     Cholecalciferol (VITAMIN D3) 25 MCG (1000 UT) CAPS Take 2,000 Units by mouth daily.     dexamethasone (DECADRON) 4 MG tablet Take one tablet the evening of chemo and twice a day thet next day, then twice a  day as needed; take with food 20 tablet 0   Levothyroxine Sodium (SYNTHROID PO) Take 75 mcg by mouth daily.      LORazepam (ATIVAN) 0.5 MG tablet Take one tablet by mouth every 8 hours as needed for nausea or insomnia 30 tablet 0   Magnesium 250 MG TABS Take 250 mg by mouth daily.     ondansetron (ZOFRAN) 8 MG tablet Take 1 tablet (8 mg total) by mouth every 8 (eight) hours as needed for nausea or vomiting. 20 tablet 3   prochlorperazine (COMPAZINE) 10 MG tablet Take 1 tablet (10 mg total) by mouth every 8 (eight) hours as needed for nausea or vomiting. 20 tablet 0   valACYclovir (VALTREX) 500 MG tablet Take 1 tablet (500 mg total) by mouth 2 (two) times daily. 60 tablet 1   vitamin C (ASCORBIC ACID) 500 MG tablet Take 500 mg by mouth daily.     No current facility-administered medications for this visit.    Facility-Administered Medications Ordered in Other Visits  Medication Dose Route Frequency Provider Last Rate Last Dose   sodium chloride flush (NS) 0.9 % injection 10 mL  10 mL Intracatheter PRN  Brittin Belnap, Virgie Dad, MD        OBJECTIVE: Middle-aged white woman in no acute distress  Vitals:   05/20/19 1530  BP: (!) 144/88  Pulse: 68  Resp: 18  Temp: 98.7 F (37.1 C)  SpO2: 99%   Wt Readings from Last 3 Encounters:  05/20/19 158 lb 14.4 oz (72.1 kg)  05/12/19 157 lb 3.2 oz (71.3 kg)  05/05/19 150 lb 14.4 oz (68.4 kg)   Body mass index is 25.65 kg/m.    ECOG FS:1 - Symptomatic but completely ambulatory  Ocular: Sclerae unicteric, pupils round and equal Ear-nose-throat: Wearing a mask Lymphatic: No cervical or supraclavicular adenopathy Lungs no rales or rhonchi Heart regular rate and rhythm Abd soft, nontender, positive bowel sounds MSK no focal spinal tenderness, no joint edema Neuro: non-focal, well-oriented, appropriate affect Breasts: The right breast is unremarkable.  The left breast is status post lumpectomy.  There is no evidence of disease activity.  Both axillae are benign    LAB RESULTS:  CMP     Component Value Date/Time   NA 134 (L) 05/20/2019 1522   K 4.2 05/20/2019 1522   CL 99 05/20/2019 1522   CO2 26 05/20/2019 1522   GLUCOSE 100 (H) 05/20/2019 1522   BUN 21 (H) 05/20/2019 1522   CREATININE 0.81 05/20/2019 1522   CREATININE 0.81 05/12/2019 1145   CALCIUM 8.6 (L) 05/20/2019 1522   PROT 6.7 05/20/2019 1522   ALBUMIN 3.8 05/20/2019 1522   AST 15 05/20/2019 1522   AST 19 05/12/2019 1145   ALT 12 05/20/2019 1522   ALT 16 05/12/2019 1145   ALKPHOS 54 05/20/2019 1522   BILITOT <0.2 (L) 05/20/2019 1522   BILITOT 0.3 05/12/2019 1145   GFRNONAA >60 05/20/2019 1522   GFRNONAA >60 05/12/2019 1145   GFRAA >60 05/20/2019 1522   GFRAA >60 05/12/2019 1145    No results found for: TOTALPROTELP, ALBUMINELP, A1GS, A2GS, BETS, BETA2SER, GAMS, MSPIKE, SPEI  No results found for: KPAFRELGTCHN, LAMBDASER, KAPLAMBRATIO  Lab Results  Component Value Date   WBC 5.1 05/20/2019   NEUTROABS 3.7 05/20/2019   HGB 10.2 (L) 05/20/2019   HCT 29.9 (L)  05/20/2019   MCV 90.9 05/20/2019   PLT 302 05/20/2019    @LASTCHEMISTRY @  No results found for: LABCA2  No components found for:  [id number]  No results for input(s): INR in the last 168 hours.  No results found for: LABCA2  No results found for: HBZ169  No results found for: CVE938  No results found for: BOF751  No results found for: CA2729  No components found for: HGQUANT  No results found for: CEA1 / No results found for: CEA1   No results found for: AFPTUMOR  No results found for: [place]  No results found for: PSA1  Appointment on 05/20/2019  Component Date Value Ref Range Status   Sodium 05/20/2019 134* 135 - 145 mmol/L Final   Potassium 05/20/2019 4.2  3.5 - 5.1 mmol/L Final   Chloride 05/20/2019 99  98 - 111 mmol/L Final   CO2 05/20/2019 26  22 - 32 mmol/L Final   Glucose, Bld 05/20/2019 100* 70 - 99 mg/dL Final   BUN 05/20/2019 21* 6 - 20 mg/dL Final   Creatinine, Ser 05/20/2019 0.81  0.44 - 1.00 mg/dL Final   Calcium 05/20/2019 8.6* 8.9 - 10.3 mg/dL Final   Total Protein 05/20/2019 6.7  6.5 - 8.1 g/dL Final   Albumin 05/20/2019 3.8  3.5 - 5.0 g/dL Final   AST 05/20/2019 15  15 - 41 U/L Final   ALT 05/20/2019 12  0 - 44 U/L Final   Alkaline Phosphatase 05/20/2019 54  38 - 126 U/L Final   Total Bilirubin 05/20/2019 <0.2* 0.3 - 1.2 mg/dL Final   GFR calc non Af Amer 05/20/2019 >60  >60 mL/min Final   GFR calc Af Amer 05/20/2019 >60  >60 mL/min Final   Anion gap 05/20/2019 9  5 - 15 Final   Performed at [facility], [place] [address] [place], [place] [address]   WBC 05/20/2019 5.1  4.0 - 10.5 K/uL Final   RBC 05/20/2019 3.29* 3.87 - 5.11 MIL/uL Final   Hemoglobin 05/20/2019 10.2* 12.0 - 15.0 g/dL Final   HCT 05/20/2019 29.9* 36.0 - 46.0 % Final   MCV 05/20/2019 90.9  80.0 - 100.0 fL Final   MCH 05/20/2019 31.0  26.0 - 34.0 pg Final   MCHC 05/20/2019 34.1  30.0 - 36.0 g/dL Final   RDW 05/20/2019 13.3   11.5 - 15.5 % Final   Platelets 05/20/2019 302  150 - 400 K/uL Final   nRBC 05/20/2019 0.0  0.0 - 0.2 % Final   Neutrophils Relative % 05/20/2019 72  % Final   Neutro Abs 05/20/2019 3.7  1.7 - 7.7 K/uL Final   Lymphocytes Relative 05/20/2019 15  % Final   Lymphs Abs 05/20/2019 0.8  0.7 - 4.0 K/uL Final   Monocytes Relative 05/20/2019 7  % Final   Monocytes Absolute 05/20/2019 0.3  0.1 - 1.0 K/uL Final   Eosinophils Relative 05/20/2019 4  % Final   Eosinophils Absolute 05/20/2019 0.2  0.0 - 0.5 K/uL Final   Basophils Relative 05/20/2019 1  % Final   Basophils Absolute 05/20/2019 0.1  0.0 - 0.1 K/uL Final   Immature Granulocytes 05/20/2019 1  % Final   Abs Immature Granulocytes 05/20/2019 0.04  0.00 - 0.07 K/uL Final   Performed at [facility], [place] [address], [place], [place] [address]    (this displays the last labs from the last 3 days)  No results found for: TOTALPROTELP, ALBUMINELP, A1GS, A2GS, BETS, BETA2SER, GAMS, MSPIKE, SPEI (this displays SPEP labs)  No results found for: KPAFRELGTCHN, LAMBDASER, KAPLAMBRATIO (kappa/lambda light chains)  No results found for: HGBA, HGBA2QUANT, HGBFQUANT, HGBSQUAN (Hemoglobinopathy  evaluation)   No results found for: LDH  No results found for: IRON, TIBC, IRONPCTSAT (Iron and TIBC)  No results found for: FERRITIN  Urinalysis No results found for: COLORURINE, APPEARANCEUR, LABSPEC, PHURINE, GLUCOSEU, HGBUR, BILIRUBINUR, KETONESUR, PROTEINUR, UROBILINOGEN, NITRITE, LEUKOCYTESUR   STUDIES: No results found.  ELIGIBLE FOR AVAILABLE RESEARCH PROTOCOL: no  ASSESSMENT: 58 y.o. [place] woman status post left breast biopsy 02/12/2019 for a clinical T1b N0, stage IA invasive ductal carcinoma, estrogen receptor positive, progesterone receptor negative, HER-2 amplified, with an MIB-1-1 of 25%  (1) status post left lumpectomy and sentinel lymph node sampling 03/05/2019 for a pT1c pN0, stage IA  invasive ductal carcinoma, grade 2, with positive lymphovascular invasion but negative margins  (a) a total of 8 lymph nodes removed (4 sentinel)   (2) adjuvant chemotherapy with paclitaxel and trastuzumab weekly x12 started 04/07/2019  (a) paclitaxel stopped after 4 doses because of peripheral neuropathy  (b) cyclophosphamide, methotrexate, fluorouracil started 05/12/2019, to be repeated q. 21 days x 3  (3) trastuzumab to be continued to complete 1 year  (a) echo 02/26/2019 shows an ejection fraction in the 60-65% range  (4) adjuvant radiation to follow  (5) antiestrogens to start at the completion of local treatment  (6) genetics testing 02/25/2019 through the Multi-Gene Panel offered by [facility] found no deleterious mutations in AIP, ALK, APC, ATM, AXIN2,BAP1,  BARD1, BLM, BMPR1A, BRCA1, BRCA2, BRIP1, CASR, CDC73, CDH1, CDK4, CDKN1B, CDKN1C, CDKN2A (p14ARF), CDKN2A (p16INK4a), CEBPA, CHEK2, CTNNA1, DICER1, DIS3L2, EGFR (c.2369C>T, p.Thr790Met variant only), EPCAM (Deletion/duplication testing only), FH, FLCN, GATA2, GPC3, GREM1 (Promoter region deletion/duplication testing only), HOXB13 (c.251G>A, p.Gly84Glu), HRAS, KIT, MAX, MEN1, MET, MITF (c.952G>A, p.Glu318Lys variant only), MLH1, MSH2, MSH3, MSH6, MUTYH, NBN, NF1, NF2, NTHL1, PALB2, PDGFRA, PHOX2B, PMS2, POLD1, POLE, POT1, PRKAR1A, PTCH1, PTEN, RAD50, RAD51C, RAD51D, RB1, RECQL4, RET, RNF43, RUNX1, SDHAF2, SDHA (sequence changes only), SDHB, SDHC, SDHD, SMAD4, SMARCA4, SMARCB1, SMARCE1, STK11, SUFU, TERC, TERT, TMEM127, TP53, TSC1, TSC2, VHL, WRN and WT1.      PLAN: Denaisha did moderately well with her first cycle of CMF.  She did have more retching than I would like so she is going to be taking Decadron 4 mg the evening of chemo and then twice the next day.  After that she may take it as needed.  This is likely to cause her insomnia so I also wrote for lorazepam for her to take at bedtime as needed.  We discussed the possible toxicities  side effects and complications of these various agents.  Because she has had shingles on several occasions and because of her strong family history of lupus I went ahead and got some immunologic labs.  Her IgG and IgA are normal.  She does have a positive ANA at a titer of 1: 160.  She understands that many normal people do have positive ANAs and she certainly does not have systemic disease, but she is hypothyroid, has some psoriasis, and has a significant family history so I think that all hangs together.  I reassured her that she does not have lupus  She is concerned because her father died of a sudden heart attack in his mid 85s and she has an elevated LDL.  She wonders what else she should do for cardiac prevention.  I think when she sees our cardio oncology physician at the time of her next echo as she can discuss this with him and certainly she can consider a statin.  She will meet with Dr. Randa Ngo and if she  decides to have chemotherapy while she is receiving her last 2 cycles I will omit the methotrexate.  Otherwise she knows to call for any other issue that may develop before the next visit.   Esmerelda Finnigan, Virgie Dad, MD  05/20/19 4:16 PM Medical Oncology and Hematology [facility] [place], [place] [address] Tel. [phone]    Fax. [phone]  I, Jacqualyn Posey am acting as a Education administrator for Chauncey Cruel, MD.   I, [name] MD, have reviewed the above documentation for accuracy and completeness, and I agree with the above.

## 2019-05-20 ENCOUNTER — Inpatient Hospital Stay (HOSPITAL_BASED_OUTPATIENT_CLINIC_OR_DEPARTMENT_OTHER): Payer: Managed Care, Other (non HMO) | Admitting: Oncology

## 2019-05-20 ENCOUNTER — Inpatient Hospital Stay: Payer: Managed Care, Other (non HMO)

## 2019-05-20 ENCOUNTER — Other Ambulatory Visit: Payer: Self-pay

## 2019-05-20 VITALS — BP 144/88 | HR 68 | Temp 98.7°F | Resp 18 | Ht 66.0 in | Wt 158.9 lb

## 2019-05-20 DIAGNOSIS — Z5112 Encounter for antineoplastic immunotherapy: Secondary | ICD-10-CM | POA: Diagnosis not present

## 2019-05-20 DIAGNOSIS — Z170 Estrogen receptor positive status [ER+]: Secondary | ICD-10-CM

## 2019-05-20 DIAGNOSIS — R768 Other specified abnormal immunological findings in serum: Secondary | ICD-10-CM

## 2019-05-20 DIAGNOSIS — C50512 Malignant neoplasm of lower-outer quadrant of left female breast: Secondary | ICD-10-CM | POA: Diagnosis not present

## 2019-05-20 DIAGNOSIS — Z95828 Presence of other vascular implants and grafts: Secondary | ICD-10-CM

## 2019-05-20 LAB — CBC WITH DIFFERENTIAL/PLATELET
Abs Immature Granulocytes: 0.04 10*3/uL (ref 0.00–0.07)
Basophils Absolute: 0.1 10*3/uL (ref 0.0–0.1)
Basophils Relative: 1 %
Eosinophils Absolute: 0.2 10*3/uL (ref 0.0–0.5)
Eosinophils Relative: 4 %
HCT: 29.9 % — ABNORMAL LOW (ref 36.0–46.0)
Hemoglobin: 10.2 g/dL — ABNORMAL LOW (ref 12.0–15.0)
Immature Granulocytes: 1 %
Lymphocytes Relative: 15 %
Lymphs Abs: 0.8 10*3/uL (ref 0.7–4.0)
MCH: 31 pg (ref 26.0–34.0)
MCHC: 34.1 g/dL (ref 30.0–36.0)
MCV: 90.9 fL (ref 80.0–100.0)
Monocytes Absolute: 0.3 10*3/uL (ref 0.1–1.0)
Monocytes Relative: 7 %
Neutro Abs: 3.7 10*3/uL (ref 1.7–7.7)
Neutrophils Relative %: 72 %
Platelets: 302 10*3/uL (ref 150–400)
RBC: 3.29 MIL/uL — ABNORMAL LOW (ref 3.87–5.11)
RDW: 13.3 % (ref 11.5–15.5)
WBC: 5.1 10*3/uL (ref 4.0–10.5)
nRBC: 0 % (ref 0.0–0.2)

## 2019-05-20 LAB — COMPREHENSIVE METABOLIC PANEL
ALT: 12 U/L (ref 0–44)
AST: 15 U/L (ref 15–41)
Albumin: 3.8 g/dL (ref 3.5–5.0)
Alkaline Phosphatase: 54 U/L (ref 38–126)
Anion gap: 9 (ref 5–15)
BUN: 21 mg/dL — ABNORMAL HIGH (ref 6–20)
CO2: 26 mmol/L (ref 22–32)
Calcium: 8.6 mg/dL — ABNORMAL LOW (ref 8.9–10.3)
Chloride: 99 mmol/L (ref 98–111)
Creatinine, Ser: 0.81 mg/dL (ref 0.44–1.00)
GFR calc Af Amer: >60 mL/min (ref >60)
GFR calc non Af Amer: >60 mL/min (ref >60)
Glucose, Bld: 100 mg/dL — ABNORMAL HIGH (ref 70–99)
Potassium: 4.2 mmol/L (ref 3.5–5.1)
Sodium: 134 mmol/L — ABNORMAL LOW (ref 135–145)
Total Bilirubin: <0.2 mg/dL — ABNORMAL LOW (ref 0.3–1.2)
Total Protein: 6.7 g/dL (ref 6.5–8.1)

## 2019-05-20 MED ORDER — DEXAMETHASONE 4 MG PO TABS: ORAL_TABLET | ORAL | 0 refills | Status: DC

## 2019-05-20 MED ORDER — SODIUM CHLORIDE 0.9% FLUSH
10.0000 mL | INTRAVENOUS | Status: DC | PRN
  Administered 2019-05-20: 10 mL
  Filled 2019-05-20: qty 10

## 2019-05-20 MED ORDER — PROCHLORPERAZINE MALEATE 10 MG PO TABS: 10.0000 mg | ORAL_TABLET | Freq: Three times a day (TID) | ORAL | 0 refills | Status: DC | PRN

## 2019-05-20 MED ORDER — HEPARIN SOD (PORK) LOCK FLUSH 100 UNIT/ML IV SOLN
500.0000 [IU] | Freq: Once | INTRAVENOUS | Status: AC | PRN
  Administered 2019-05-20: 15:00:00 500 [IU]
  Filled 2019-05-20: qty 5

## 2019-05-20 MED ORDER — LORAZEPAM 0.5 MG PO TABS: ORAL_TABLET | ORAL | 0 refills | Status: DC

## 2019-05-21 ENCOUNTER — Telehealth: Payer: Self-pay | Admitting: Oncology

## 2019-05-21 NOTE — Telephone Encounter (Signed)
I talk with patient regarding schedule  

## 2019-05-22 ENCOUNTER — Other Ambulatory Visit: Payer: Self-pay | Admitting: *Deleted

## 2019-05-22 ENCOUNTER — Encounter: Payer: Self-pay | Admitting: *Deleted

## 2019-05-22 DIAGNOSIS — C50512 Malignant neoplasm of lower-outer quadrant of left female breast: Secondary | ICD-10-CM

## 2019-05-25 ENCOUNTER — Ambulatory Visit
Admission: RE | Admit: 2019-05-25 | Discharge: 2019-05-25 | Disposition: A | Discharge status: Home / Self Care | Payer: Managed Care, Other (non HMO) | Source: Ambulatory Visit | Attending: Radiation Oncology | Admitting: Radiation Oncology

## 2019-05-25 ENCOUNTER — Encounter: Payer: Self-pay | Admitting: Radiation Oncology

## 2019-05-25 ENCOUNTER — Ambulatory Visit: Payer: Managed Care, Other (non HMO) | Admitting: Radiation Oncology

## 2019-05-25 ENCOUNTER — Other Ambulatory Visit: Payer: Self-pay

## 2019-05-25 VITALS — BP 145/91 | HR 73 | Temp 98.5°F | Resp 16 | Ht 67.0 in | Wt 159.0 lb

## 2019-05-25 DIAGNOSIS — Z170 Estrogen receptor positive status [ER+]: Secondary | ICD-10-CM | POA: Diagnosis not present

## 2019-05-25 DIAGNOSIS — Z923 Personal history of irradiation: Secondary | ICD-10-CM | POA: Insufficient documentation

## 2019-05-25 DIAGNOSIS — C50512 Malignant neoplasm of lower-outer quadrant of left female breast: Secondary | ICD-10-CM

## 2019-05-25 DIAGNOSIS — Z79899 Other long term (current) drug therapy: Secondary | ICD-10-CM | POA: Diagnosis not present

## 2019-05-25 DIAGNOSIS — G629 Polyneuropathy, unspecified: Secondary | ICD-10-CM | POA: Insufficient documentation

## 2019-05-25 DIAGNOSIS — Z9221 Personal history of antineoplastic chemotherapy: Secondary | ICD-10-CM | POA: Insufficient documentation

## 2019-05-25 NOTE — Progress Notes (Signed)
Radiation Oncology         [phone] ________________________________  Name: Allison Hanna MRN: [id number]  Date: 05/25/2019  DOB: [birth year]  Re-Evaluation Note  CC: Merrilee Seashore, MD  Magrinat, Virgie Dad, MD    ICD-10-CM   1. Malignant neoplasm of lower-outer quadrant of left breast of female, estrogen receptor positive ([place])  C50.512    Z17.0     Diagnosis:   Stage IA (pT1c, pN0) Left Breast LOQ, Invasive Ductal Carcinoma, ER+ / PR- / Her2+, Grade 2  Narrative:  The patient returns today to discuss radiation treatment options. She was seen in the multidisciplinary breast clinic on 02/18/2019.   Since consultation, she underwent genetic testing on 02/26/2019. Results were negative.  She opted to proceed with left lumpectomy with sentinel lymph node biopsy on 03/05/2019. Pathology from the procedure revealed: invasive ductal carcinoma, grade 2, 1.1 cm; margins uninvolved; lymphovascular space invasion present; negative lymph nodes (0/8).   She has been treated with paclitaxel and trastuzumab weekly x12 under Dr. Jana Hakim started 04/07/2019. Paclitaxel was stopped after 4 doses because of peripheral neuropathy. She was started on cyclophosphamide, methotrexate, fluorouracil on 05/12/2019 to be repeat every 3 weeks, x3.  On review of systems, the patient reports tenderness to her axilla, which she attributes to the surgical incision. She denies pain and any other symptoms.    Allergies:  has No Known Allergies.  Meds: Current Outpatient Medications  Medication Sig Dispense Refill  . calcium carbonate (OS-CAL) 600 MG TABS tablet Take 1,200 mg by mouth 2 (two) times daily with a meal.    . Cholecalciferol (VITAMIN D3) 25 MCG (1000 UT) CAPS Take 2,000 Units by mouth daily.    Marland Kitchen dexamethasone (DECADRON) 4 MG tablet Take one tablet the evening of chemo and twice a day thet next day, then twice a day as needed; take with food 20 tablet 0  . Levothyroxine Sodium (SYNTHROID PO) Take  75 mcg by mouth daily.     Marland Kitchen LORazepam (ATIVAN) 0.5 MG tablet Take one tablet by mouth every 8 hours as needed for nausea or insomnia 30 tablet 0  . Magnesium 250 MG TABS Take 250 mg by mouth daily.    . ondansetron (ZOFRAN) 8 MG tablet Take 1 tablet (8 mg total) by mouth every 8 (eight) hours as needed for nausea or vomiting. 20 tablet 3  . prochlorperazine (COMPAZINE) 10 MG tablet Take 1 tablet (10 mg total) by mouth every 8 (eight) hours as needed for nausea or vomiting. 20 tablet 0  . valACYclovir (VALTREX) 500 MG tablet Take 1 tablet (500 mg total) by mouth 2 (two) times daily. 60 tablet 1  . vitamin C (ASCORBIC ACID) 500 MG tablet Take 500 mg by mouth daily.     No current facility-administered medications for this encounter.    Facility-Administered Medications Ordered in Other Encounters  Medication Dose Route Frequency Provider Last Rate Last Dose  . sodium chloride flush (NS) 0.9 % injection 10 mL  10 mL Intracatheter PRN Magrinat, Virgie Dad, MD        Physical Findings: The patient is in no acute distress. Patient is alert and oriented.  height is _0  (1.702 m) and weight is 159 lb (72.1 kg). Her temporal temperature is 98.5 F (36.9 C). Her blood pressure is 145/91 (abnormal) and her pulse is 73. Her respiration is 16 and oxygen saturation is 98%.  No significant changes. Lungs are clear to auscultation bilaterally. Heart has regular rate and rhythm.  No palpable cervical, supraclavicular, or axillary adenopathy. Abdomen soft, non-tender, normal bowel sounds. Right Breast: no palpable mass, nipple discharge or bleeding. Left Breast: well-healed scar in the LOQ along the outer inframammary fold. No palpable or visible signs of recurrence. Patient also has a separate scar in the axillary region from her sentinel node procedure.  Lab Findings: Lab Results  Component Value Date   WBC 5.1 05/20/2019   HGB 10.2 (L) 05/20/2019   HCT 29.9 (L) 05/20/2019   MCV 90.9 05/20/2019   PLT  302 05/20/2019    Radiographic Findings: No results found.  Impression:  Stage IA (pT1c, pN0) Left Breast LOQ, Invasive Ductal Carcinoma, ER+ / PR- / Her2+, Grade 2  Patient would be a good candidate for breast conservation with radiation therapy directed at the right breast. We discussed initiation of radiation therapy with her last two cycles of chemotherapy. This would entail dropping the methotrexate from her regimen. I discussed with the patient that she would not be a candidate for hypofractionated radiation therapy if she is receiving chemotherapy, because we have no data as to say  this is safe. Patient would be a candidate for hypofractionated radiation therapy after she completed chemotherapy, and this is her preference. Patient will complete her last cycle of chemotherapy in late October. She would then like to proceed for vacation and will return on 07/08/2019 for CT simulation with treatments to begin approximately a week later. She will receive 21 treatments total.  Plan:  Patient is scheduled for CT simulation on 07/08/2019 at 1 pm.   ____________________________________ Gery Pray, MD   This document serves as a record of services personally performed by Gery Pray, MD. It was created on his behalf by Wilburn Mylar, a trained medical scribe. The creation of this record is based on the scribe's personal observations and the provider's statements to them. This document has been checked and approved by the attending provider.

## 2019-05-25 NOTE — Patient Instructions (Signed)
Coronavirus (COVID-19) Are you at risk?  Are you at risk for the Coronavirus (COVID-19)?  To be considered HIGH RISK for Coronavirus (COVID-19), you have to meet the following criteria:  . Traveled to [place], [place], [place], [place] or [place]; or in the [place] to [place], [place], [place], or [place]; and have fever, cough, and shortness of breath within the last 2 weeks of travel OR . Been in close contact with a person diagnosed with COVID-19 within the last 2 weeks and have fever, cough, and shortness of breath . IF YOU DO NOT MEET THESE CRITERIA, YOU ARE CONSIDERED LOW RISK FOR COVID-19.  What to do if you are HIGH RISK for COVID-19?  . If you are having a medical emergency, call 911. . Seek medical care right away. Before you go to a doctor's office, urgent care or emergency department, call ahead and tell them about your recent travel, contact with someone diagnosed with COVID-19, and your symptoms. You should receive instructions from your physician's office regarding next steps of care.  . When you arrive at healthcare provider, tell the healthcare staff immediately you have returned from visiting [place], [place], [place], [place] or [place]; or traveled in the [place] to [place], [place], [place], or [place]; in the last two weeks or you have been in close contact with a person diagnosed with COVID-19 in the last 2 weeks.   . Tell the health care staff about your symptoms: fever, cough and shortness of breath. . After you have been seen by a medical provider, you will be either: o Tested for (COVID-19) and discharged home on quarantine except to seek medical care if symptoms worsen, and asked to  - Stay home and avoid contact with others until you get your results (4-5 days)  - Avoid travel on public transportation if possible (such as bus, train, or airplane) or o Sent to the Emergency Department by EMS for evaluation, COVID-19 testing, and possible  admission depending on your condition and test results.  What to do if you are LOW RISK for COVID-19?  Reduce your risk of any infection by using the same precautions used for avoiding the common cold or flu:  . Wash your hands often with soap and warm water for at least 20 seconds.  If soap and water are not readily available, use an alcohol-based hand sanitizer with at least 60% alcohol.  . If coughing or sneezing, cover your mouth and nose by coughing or sneezing into the elbow areas of your shirt or coat, into a tissue or into your sleeve (not your hands). . Avoid shaking hands with others and consider head nods or verbal greetings only. . Avoid touching your eyes, nose, or mouth with unwashed hands.  . Avoid close contact with people who are sick. . Avoid places or events with large numbers of people in one location, like concerts or sporting events. . Carefully consider travel plans you have or are making. . If you are planning any travel outside or inside the [place], visit the CDC's Travelers' Health webpage for the latest health notices. . If you have some symptoms but not all symptoms, continue to monitor at home and seek medical attention if your symptoms worsen. . If you are having a medical emergency, call 911.   ADDITIONAL HEALTHCARE OPTIONS FOR PATIENTS  [facility] / e-Visit: [email or web address]         [facility]: [phone]  [facility]: [phone]                   [facility]: [phone]   

## 2019-05-25 NOTE — Progress Notes (Signed)
Location of Breast Cancer:Malignant neoplasm of lower-outer quadrant of left breast of female, estrogen receptor positive ([place])    Histology per Pathology Report: 03/05/19:  Diagnosis 1. Breast, lumpectomy, Left w/seed - INVASIVE DUCTAL CARCINOMA, NOTTINGHAM GRADE 2 OF 3, 1.1 CM - MARGINS UNINVOLVED BY CARCINOMA (LESS THAN 0.1 CM; ANTERIOR MARGIN) - LYMPHOVASCULAR SPACE INVASION PRESENT - SEE ONCOLOGY TABLE BELOW 2. Lymph node, sentinel, biopsy, Left axillary #1 - NO CARCINOMA IDENTIFIED IN ONE LYMPH NODE (0/1) 3. Lymph node, sentinel, biopsy, Left - NO CARCINOMA IDENTIFIED IN ONE LYMPH NODE (0/1) 4. Lymph node, sentinel, biopsy, Left Axillary #2 - NO CARCINOMA IDENTIFIED IN ONE LYMPH NODE (0/1) 5. Lymph node, sentinel, biopsy, Left Axillary #3 - NO CARCINOMA IDENTIFIED IN ONE LYMPH NODE (0/1) 6. Lymph nodes, regional resection, Left additional axillary - NO CARCINOMA IDENTIFIED IN FOUR LYMPH NODES (0/4)  Receptor Status: ER(100%), PR (0%), Her2-neu (POSITIVE, 3+), Ki-(25%)  Did patient present with symptoms (if so, please note symptoms) or was this found on screening mammography?: "Allison Hanna" presented with a palpable left breast mass. She initially experienced soreness to her left breast around April 2020 and tried to get a mammogram at this point, but due to pandemic concerns, the scan was delayed.  She did not actually feel a mass in the breast until June 1.  She underwent bilateral diagnostic mammography with tomography and left breast ultrasonography at [facility] on 02/11/2019 showing: breast density category B; irregular hyperechoic mass in the left breast at 4 o'clock 5 cm from the nipple measuring 8 mm; no enlarged adenopathy in the left axilla.  Accordingly on 02/12/2019 she proceeded to biopsy of the left breast area in question. The pathology from this procedure ([id number]) showed: invasive mammary carcinoma, e-cadherin positive. Prognostic indicators significant for:  estrogen receptor, 100% positive with strong staining intensity and progesterone receptor, 0% negative. Proliferation marker Ki67 at 25%. HER2 positive by immunohistochemistry, (3+).   Past/Anticipated interventions by surgeon, if any: 03/05/19: Procedure:                 Insertion PowerPort [place] 8 French tunneled venous vascular access device                                      Use of fluoroscopy for guidance and positioning                                      Inject blue dye left breast                                      Left breast lumpectomy with radioactive seed localization                                      Left axillary deep sentinel lymph node biopsy  Surgeon:                     Edsel Petrin. Dalbert Batman, M.D., [facility]  Past/Anticipated interventions by medical oncology, if any: Chemotherapy Per Dr. Jana Hakim 05/20/19: PLAN: Donyetta did moderately well with her first cycle of CMF.  She did have more retching than I would like  so she is going to be taking Decadron 4 mg the evening of chemo and then twice the next day.  After that she may take it as needed.  This is likely to cause her insomnia so I also wrote for lorazepam for her to take at bedtime as needed.  We discussed the possible toxicities side effects and complications of these various agents.  Because she has had shingles on several occasions and because of her strong family history of lupus I went ahead and got some immunologic labs.  Her IgG and IgA are normal.  She does have a positive ANA at a titer of 1: 160.  She understands that many normal people do have positive ANAs and she certainly does not have systemic disease, but she is hypothyroid, has some psoriasis, and has a significant family history so I think that all hangs together.  I reassured her that she does not have lupus  She is concerned because her father died of a sudden heart attack in his mid 86s and she has an elevated LDL.  She wonders what else she should do  for cardiac prevention.  I think when she sees our cardio oncology physician at the time of her next echo as she can discuss this with him and certainly she can consider a statin.  She will meet with Dr. Randa Ngo and if she decides to have chemotherapy while she is receiving her last 2 cycles I will omit the methotrexate.  Lymphedema issues, if any:  Pt reports that axilla is "a little tender" but attributes that to surgical incision.  Pain issues, if any:  Pt denies c/o pain.   SAFETY ISSUES:  Prior radiation? No  Pacemaker/ICD? No  Possible current pregnancy? No  Is the patient on methotrexate? YES, pt is on CMF chemotherapy  Current Complaints / other details:  Pt presents today for consult with Dr. Sondra Come for Radiation Oncology.  BP (!) 145/91 (BP Location: Right Arm)   Pulse 73   Temp 98.5 F (36.9 C) (Temporal)   Resp 16   Ht _0  (1.702 m)   Wt 159 lb (72.1 kg)   SpO2 98%   BMI 24.90 kg/m   Wt Readings from Last 3 Encounters:  05/25/19 159 lb (72.1 kg)  05/20/19 158 lb 14.4 oz (72.1 kg)  05/12/19 157 lb 3.2 oz (71.3 kg)       Loma Sousa, RN 05/25/2019,2:00 PM

## 2019-05-26 ENCOUNTER — Ambulatory Visit: Payer: Managed Care, Other (non HMO)

## 2019-05-26 ENCOUNTER — Other Ambulatory Visit: Payer: Managed Care, Other (non HMO)

## 2019-05-27 ENCOUNTER — Encounter (HOSPITAL_COMMUNITY): Payer: Self-pay | Admitting: Cardiology

## 2019-05-27 ENCOUNTER — Ambulatory Visit (HOSPITAL_COMMUNITY)
Admission: RE | Admit: 2019-05-27 | Discharge: 2019-05-27 | Disposition: A | Discharge status: Home / Self Care | Payer: Managed Care, Other (non HMO) | Source: Ambulatory Visit | Attending: Oncology | Admitting: Oncology

## 2019-05-27 ENCOUNTER — Ambulatory Visit (HOSPITAL_BASED_OUTPATIENT_CLINIC_OR_DEPARTMENT_OTHER)
Admission: RE | Admit: 2019-05-27 | Discharge: 2019-05-27 | Disposition: A | Discharge status: Home / Self Care | Payer: Managed Care, Other (non HMO) | Source: Ambulatory Visit | Attending: Cardiology | Admitting: Cardiology

## 2019-05-27 ENCOUNTER — Other Ambulatory Visit: Payer: Self-pay

## 2019-05-27 ENCOUNTER — Telehealth (HOSPITAL_COMMUNITY): Payer: Self-pay | Admitting: *Deleted

## 2019-05-27 VITALS — BP 138/90 | HR 74 | Wt 155.2 lb

## 2019-05-27 DIAGNOSIS — Z808 Family history of malignant neoplasm of other organs or systems: Secondary | ICD-10-CM | POA: Diagnosis not present

## 2019-05-27 DIAGNOSIS — Z8249 Family history of ischemic heart disease and other diseases of the circulatory system: Secondary | ICD-10-CM | POA: Diagnosis not present

## 2019-05-27 DIAGNOSIS — C50512 Malignant neoplasm of lower-outer quadrant of left female breast: Secondary | ICD-10-CM

## 2019-05-27 DIAGNOSIS — Z79899 Other long term (current) drug therapy: Secondary | ICD-10-CM | POA: Diagnosis not present

## 2019-05-27 DIAGNOSIS — Z803 Family history of malignant neoplasm of breast: Secondary | ICD-10-CM | POA: Diagnosis not present

## 2019-05-27 DIAGNOSIS — Z800 Family history of malignant neoplasm of digestive organs: Secondary | ICD-10-CM | POA: Diagnosis not present

## 2019-05-27 DIAGNOSIS — E039 Hypothyroidism, unspecified: Secondary | ICD-10-CM | POA: Insufficient documentation

## 2019-05-27 DIAGNOSIS — E785 Hyperlipidemia, unspecified: Secondary | ICD-10-CM | POA: Insufficient documentation

## 2019-05-27 DIAGNOSIS — Z170 Estrogen receptor positive status [ER+]: Secondary | ICD-10-CM | POA: Diagnosis not present

## 2019-05-27 DIAGNOSIS — Z8049 Family history of malignant neoplasm of other genital organs: Secondary | ICD-10-CM | POA: Insufficient documentation

## 2019-05-27 LAB — LIPID PANEL
Cholesterol: 230 mg/dL — ABNORMAL HIGH (ref 0–200)
HDL: 83 mg/dL (ref >40)
LDL Cholesterol: 138 mg/dL — ABNORMAL HIGH (ref 0–99)
Total CHOL/HDL Ratio: 2.8 RATIO
Triglycerides: 43 mg/dL (ref <150)
VLDL: 9 mg/dL (ref 0–40)

## 2019-05-27 NOTE — Patient Instructions (Signed)
Your provider requests you have a calcium scoring CT. (they will contact you to schedule appointment)    Follow up and echo with Dr.McLean in 3 months

## 2019-05-27 NOTE — Progress Notes (Signed)
Oncology: Dr. Jana Hakim  58 y.o. with history of hyperlipidemia and breast cancer was referred by Dr. Jana Hakim for cardio-oncology evaluation given Herceptin use.  Diagnosed with left breast cancer in 6/20, ER+/PR-/HER2+.  She had left lumpectomy and was initially started on Paclitaxel + Herceptin for 12 cycles but developed neuropathy.  Herceptin was continued, but she was transitioned to carboplatin/gemcitabine to complete 12 cycles.  She will have radiation as well.   She has a family history of early coronary disease, her father had an MI in his mid 63s.  Mother had a CABG at 25.  Patient is a nonsmoker with no prior cardiac history. She has had elevated lipids but is not medicated.  She is concerned about her coronary disease risk given father's history.   She walks 3-4 miles/day now without dyspnea.  No chest pain.  She does get short of breath walking up a steep hill.  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   PMH: 1. Hyperlipidemia: Untreated.  2. Hypothyroidism 3. Left breast cancer: Diagnosed 6/20, ER+/PR-/HER2+.  She had left lumpectomy and was initially started on Paclitaxel + Herceptin for 12 cycles but developed neuropathy.  Herceptin was continued, but she was transitioned to carboplatin/gemcitabine to complete 12 cycles.  She will have radiation.   - Echo (7/20): EF 60-65%, normal RV size and systolic function, GLS -32.4%.  - Echo (9/20): EF 55-60%, GLS -22.1%.   Social History   Socioeconomic History  . Marital status: Married    Spouse name: Not on file  . Number of children: Not on file  . Years of education: Not on file  . Highest education level: Not on file  Occupational History  . Not on file  Social Needs  . Financial resource strain: Not on file  . Food insecurity    Worry: Not on file    Inability: Not on file  . Transportation needs    Medical: Not on file    Non-medical: Not on file  Tobacco Use  . Smoking status: Never Smoker  . Smokeless  tobacco: Never Used  Substance and Sexual Activity  . Alcohol use: Yes    Alcohol/week: 2.0 standard drinks    Types: 2 Standard drinks or equivalent per week  . Drug use: Never  . Sexual activity: Not on file  Lifestyle  . Physical activity    Days per week: Not on file    Minutes per session: Not on file  . Stress: Not on file  Relationships  . Social Herbalist on phone: Not on file    Gets together: Not on file    Attends religious service: Not on file    Active member of club or organization: Not on file    Attends meetings of clubs or organizations: Not on file    Relationship status: Not on file  . Intimate partner violence    Fear of current or ex partner: Not on file    Emotionally abused: Not on file    Physically abused: Not on file    Forced sexual activity: Not on file  Other Topics Concern  . Not on file  Social History Narrative  . Not on file   Family History  Problem Relation Age of Onset  . Breast cancer Mother 75  . Cervical cancer Mother   . Heart attack Father   . Skin cancer Brother   . Breast cancer Paternal Aunt  dx over 72s  . Breast cancer Paternal Aunt        dx over 82  . Uterine cancer Paternal Aunt 29  . Cervical cancer Maternal Aunt   . Liver cancer Paternal Uncle 72  . Stroke Maternal Grandmother   . Parkinson's disease Paternal Grandfather   . Breast cancer Cousin 45       mother's maternal first cousin  . Melanoma Cousin 40       pat first cousin   ROS: All systems reviewed and negative except as per HPI.   Current Outpatient Medications  Medication Sig Dispense Refill  . calcium carbonate (OS-CAL) 600 MG TABS tablet Take 1,200 mg by mouth 2 (two) times daily with a meal.    . Cholecalciferol (VITAMIN D3) 25 MCG (1000 UT) CAPS Take 2,000 Units by mouth daily.    Marland Kitchen dexamethasone (DECADRON) 4 MG tablet Take one tablet the evening of chemo and twice a day thet next day, then twice a day as needed; take with food 20  tablet 0  . Levothyroxine Sodium (SYNTHROID PO) Take 75 mcg by mouth daily.     Marland Kitchen LORazepam (ATIVAN) 0.5 MG tablet Take one tablet by mouth every 8 hours as needed for nausea or insomnia 30 tablet 0  . Magnesium 250 MG TABS Take 250 mg by mouth daily.    . ondansetron (ZOFRAN) 8 MG tablet Take 1 tablet (8 mg total) by mouth every 8 (eight) hours as needed for nausea or vomiting. 20 tablet 3  . prochlorperazine (COMPAZINE) 10 MG tablet Take 1 tablet (10 mg total) by mouth every 8 (eight) hours as needed for nausea or vomiting. 20 tablet 0  . valACYclovir (VALTREX) 500 MG tablet Take 1 tablet (500 mg total) by mouth 2 (two) times daily. 60 tablet 1  . vitamin C (ASCORBIC ACID) 500 MG tablet Take 500 mg by mouth daily.     No current facility-administered medications for this encounter.    Facility-Administered Medications Ordered in Other Encounters  Medication Dose Route Frequency Provider Last Rate Last Dose  . sodium chloride flush (NS) 0.9 % injection 10 mL  10 mL Intracatheter PRN Magrinat, Virgie Dad, MD       BP 138/90   Pulse 74   Wt 70.4 kg (155 lb 4 oz)   SpO2 99%   BMI 24.32 kg/m  General: NAD Neck: No JVD, no thyromegaly or thyroid nodule.  Lungs: Clear to auscultation bilaterally with normal respiratory effort. CV: Nondisplaced PMI.  Heart regular S1/S2, no S3/S4, no murmur.  No peripheral edema.  No carotid bruit.  Normal pedal pulses.  Abdomen: Soft, nontender, no hepatosplenomegaly, no distention.  Skin: Intact without lesions or rashes.  Neurologic: Alert and oriented x 3.  Psych: Normal affect. Extremities: No clubbing or cyanosis.  HEENT: Normal.   Assessment/Plan: 1. Breast cancer: She will ideally complete 1 year of Herceptin therapy.  We discussed the risk of cardio-toxicity from Herceptin today. I reviewed today's echo, which showed normal LV EF and strain pattern.  - She will continue Herceptin, repeat echo in 3 months.  2. Coronary disease risk: She has a  family history of early coronary disease (father) and has history hyperlipidemia.  Based on her father's MI in his 57s, she is concerned about her risk of MI.  - Check lipids, would have low threshold to start statin.  - I will arrange for coronary calcium score scan for risk stratification.   Followup in 3 months with  echo.   Loralie Champagne 05/27/2019

## 2019-05-27 NOTE — Telephone Encounter (Signed)
Entered in error

## 2019-06-02 ENCOUNTER — Inpatient Hospital Stay (HOSPITAL_BASED_OUTPATIENT_CLINIC_OR_DEPARTMENT_OTHER): Payer: Managed Care, Other (non HMO) | Admitting: Oncology

## 2019-06-02 ENCOUNTER — Inpatient Hospital Stay: Payer: Managed Care, Other (non HMO)

## 2019-06-02 ENCOUNTER — Other Ambulatory Visit: Payer: Managed Care, Other (non HMO)

## 2019-06-02 ENCOUNTER — Other Ambulatory Visit: Payer: Self-pay

## 2019-06-02 ENCOUNTER — Telehealth: Payer: Self-pay | Admitting: *Deleted

## 2019-06-02 ENCOUNTER — Inpatient Hospital Stay: Payer: Managed Care, Other (non HMO) | Attending: Oncology

## 2019-06-02 VITALS — BP 151/82 | HR 60 | Temp 98.3°F | Resp 18 | Ht 67.0 in | Wt 157.8 lb

## 2019-06-02 DIAGNOSIS — Z5112 Encounter for antineoplastic immunotherapy: Secondary | ICD-10-CM | POA: Insufficient documentation

## 2019-06-02 DIAGNOSIS — Z170 Estrogen receptor positive status [ER+]: Secondary | ICD-10-CM | POA: Diagnosis not present

## 2019-06-02 DIAGNOSIS — Z95828 Presence of other vascular implants and grafts: Secondary | ICD-10-CM

## 2019-06-02 DIAGNOSIS — R110 Nausea: Secondary | ICD-10-CM | POA: Insufficient documentation

## 2019-06-02 DIAGNOSIS — C50512 Malignant neoplasm of lower-outer quadrant of left female breast: Secondary | ICD-10-CM

## 2019-06-02 DIAGNOSIS — Z5111 Encounter for antineoplastic chemotherapy: Secondary | ICD-10-CM | POA: Diagnosis present

## 2019-06-02 DIAGNOSIS — Z79899 Other long term (current) drug therapy: Secondary | ICD-10-CM | POA: Insufficient documentation

## 2019-06-02 LAB — COMPREHENSIVE METABOLIC PANEL
ALT: 15 U/L (ref 0–44)
AST: 18 U/L (ref 15–41)
Albumin: 3.9 g/dL (ref 3.5–5.0)
Alkaline Phosphatase: 54 U/L (ref 38–126)
Anion gap: 10 (ref 5–15)
BUN: 20 mg/dL (ref 6–20)
CO2: 25 mmol/L (ref 22–32)
Calcium: 9 mg/dL (ref 8.9–10.3)
Chloride: 98 mmol/L (ref 98–111)
Creatinine, Ser: 0.75 mg/dL (ref 0.44–1.00)
GFR calc Af Amer: >60 mL/min (ref >60)
GFR calc non Af Amer: >60 mL/min (ref >60)
Glucose, Bld: 94 mg/dL (ref 70–99)
Potassium: 4.2 mmol/L (ref 3.5–5.1)
Sodium: 133 mmol/L — ABNORMAL LOW (ref 135–145)
Total Bilirubin: 0.3 mg/dL (ref 0.3–1.2)
Total Protein: 7.1 g/dL (ref 6.5–8.1)

## 2019-06-02 LAB — CBC WITH DIFFERENTIAL/PLATELET
Abs Immature Granulocytes: 0.02 10*3/uL (ref 0.00–0.07)
Basophils Absolute: 0.1 10*3/uL (ref 0.0–0.1)
Basophils Relative: 2 %
Eosinophils Absolute: 0.1 10*3/uL (ref 0.0–0.5)
Eosinophils Relative: 4 %
HCT: 33 % — ABNORMAL LOW (ref 36.0–46.0)
Hemoglobin: 11.2 g/dL — ABNORMAL LOW (ref 12.0–15.0)
Immature Granulocytes: 1 %
Lymphocytes Relative: 26 %
Lymphs Abs: 0.8 10*3/uL (ref 0.7–4.0)
MCH: 30.9 pg (ref 26.0–34.0)
MCHC: 33.9 g/dL (ref 30.0–36.0)
MCV: 91.2 fL (ref 80.0–100.0)
Monocytes Absolute: 0.5 10*3/uL (ref 0.1–1.0)
Monocytes Relative: 15 %
Neutro Abs: 1.7 10*3/uL (ref 1.7–7.7)
Neutrophils Relative %: 52 %
Platelets: 289 10*3/uL (ref 150–400)
RBC: 3.62 MIL/uL — ABNORMAL LOW (ref 3.87–5.11)
RDW: 13.8 % (ref 11.5–15.5)
WBC: 3.2 10*3/uL — ABNORMAL LOW (ref 4.0–10.5)
nRBC: 0 % (ref 0.0–0.2)

## 2019-06-02 MED ORDER — HEPARIN SOD (PORK) LOCK FLUSH 100 UNIT/ML IV SOLN
500.0000 [IU] | Freq: Once | INTRAVENOUS | Status: AC | PRN
  Administered 2019-06-02: 500 [IU]
  Filled 2019-06-02: qty 5

## 2019-06-02 MED ORDER — SODIUM CHLORIDE 0.9 % IV SOLN
Freq: Once | INTRAVENOUS | Status: AC
  Administered 2019-06-02: 13:00:00 via INTRAVENOUS
  Filled 2019-06-02: qty 5

## 2019-06-02 MED ORDER — FLUOROURACIL CHEMO INJECTION 2.5 GM/50ML
600.0000 mg/m2 | Freq: Once | INTRAVENOUS | Status: AC
  Administered 2019-06-02: 1050 mg via INTRAVENOUS
  Filled 2019-06-02: qty 21

## 2019-06-02 MED ORDER — SODIUM CHLORIDE 0.9 % IV SOLN
600.0000 mg/m2 | Freq: Once | INTRAVENOUS | Status: AC
  Administered 2019-06-02: 14:00:00 1060 mg via INTRAVENOUS
  Filled 2019-06-02: qty 53

## 2019-06-02 MED ORDER — PALONOSETRON HCL INJECTION 0.25 MG/5ML
INTRAVENOUS | Status: AC
  Filled 2019-06-02: qty 5

## 2019-06-02 MED ORDER — PALONOSETRON HCL INJECTION 0.25 MG/5ML
0.2500 mg | Freq: Once | INTRAVENOUS | Status: AC
  Administered 2019-06-02: 13:00:00 0.25 mg via INTRAVENOUS

## 2019-06-02 MED ORDER — SODIUM CHLORIDE 0.9% FLUSH
10.0000 mL | INTRAVENOUS | Status: DC | PRN
  Administered 2019-06-02: 10 mL
  Filled 2019-06-02: qty 10

## 2019-06-02 MED ORDER — SODIUM CHLORIDE 0.9 % IV SOLN
Freq: Once | INTRAVENOUS | Status: AC
  Administered 2019-06-02: 13:00:00 via INTRAVENOUS
  Filled 2019-06-02: qty 250

## 2019-06-02 MED ORDER — METHOTREXATE SODIUM (PF) CHEMO INJECTION 250 MG/10ML
42.2000 mg/m2 | Freq: Once | INTRAMUSCULAR | Status: AC
  Administered 2019-06-02: 15:00:00 75 mg via INTRAVENOUS
  Filled 2019-06-02: qty 3

## 2019-06-02 NOTE — Telephone Encounter (Signed)
Left vm to assess for needs or questions prior to chemo later today. Contact information provided.

## 2019-06-02 NOTE — Progress Notes (Signed)
[facility]  [phone] [phone]     ID: Allison Hanna DOB: 08-26-1961  MR#: [id number]  QQI#:[id number]  Patient Care Team: Merrilee Seashore, MD as PCP - General (Internal Medicine) Mauro Kaufmann, RN as Oncology Nurse Navigator Rockwell Germany, RN as Oncology Nurse Navigator Magrinat, Virgie Dad, MD as Consulting Physician (Oncology) Gery Pray, MD as Consulting Physician (Radiation Oncology) Fanny Skates, MD as Consulting Physician (General Surgery) Brien Few, MD as Consulting Physician (Obstetrics and Gynecology) Larey Dresser, MD as Consulting Physician (Cardiology) Feliberto Harts R (Inactive) Chauncey Cruel, MD OTHER MD:   CHIEF COMPLAINT: Estrogen receptor positive breast cancer  CURRENT TREATMENT: Adjuvant chemotherapy   INTERVAL HISTORY: Allison Hanna returns today for follow-up and treatment of her estrogen receptor positive breast cancer. She was last seen here on 05/20/2019.   She continues on adjuvant trastuzumab (Ogivri).  She tolerates this well.  She also continues on CMF. Today is day 1 cycle 2.  She had significant nausea with cycle #1 and we have added dexamethasone and Compazine the cycle.  We reviewed all that again today.  Allison Hanna last echocardiogram on 05/27/2019, showed an ejection fraction in the 55% - 60% range.  She is also been scheduled for a  Since her last visit here, she saw Dr. Algernon Huxley and has been evaluated for cholesterol issues as well as a coronary artery calcification test which is pending   REVIEW OF SYSTEMS: Allison Hanna walks 3-1/2 to 4 miles every day, which is wonderful.  She and her family are taking appropriate pandemic precautions.  For the last 2 weeks she has felt "close to normal".  A detailed review of systems was otherwise noncontributory    HISTORY OF CURRENT ILLNESS: From the original intake note:  "Allison Hanna" presented with a palpable left breast mass. She initially  experienced soreness to her left breast around April 2020 and tried to get a mammogram at this point, but due to pandemic concerns, the scan was delayed.  She did not actually feel a mass in the breast until June 1.  She underwent bilateral diagnostic mammography with tomography and left breast ultrasonography at [facility] on 02/11/2019 showing: breast density category B; irregular hyperechoic mass in the left breast at 4 o'clock 5 cm from the nipple measuring 8 mm; no enlarged adenopathy in the left axilla.  Accordingly on 02/12/2019 she proceeded to biopsy of the left breast area in question. The pathology from this procedure ([id number]) showed: invasive mammary carcinoma, e-cadherin positive. Prognostic indicators significant for: estrogen receptor, 100% positive with strong staining intensity and progesterone receptor, 0% negative. Proliferation marker Ki67 at 25%. HER2 positive by immunohistochemistry, (3+).  The patient's subsequent history is as detailed below.   PAST MEDICAL HISTORY: Past Medical History:  Diagnosis Date  . Anxiety   . Family history of breast cancer   . Family history of melanoma   . High cholesterol   . Hypothyroid   . Paralyzed vocal cords 2014  . Raynauds syndrome    cold weather triggers fingers to turn white  History of palpitations (resolved)   PAST SURGICAL HISTORY: Past Surgical History:  Procedure Laterality Date  . BREAST LUMPECTOMY WITH RADIOACTIVE SEED AND SENTINEL LYMPH NODE BIOPSY Left 03/05/2019   Procedure: LEFT BREAST LUMPECTOMY WITH RADIOACTIVE SEED AND LEFT AXILLARY DEEP SENTINEL LYMPH NODE BIOPSY INJECT BLUE DYE LEFT BREAST;  Surgeon: Fanny Skates, MD;  Location: [facility];  Service: General;  Laterality: Left;  . KNEE SURGERY  Bilateral    arthroscopy for menicus tears  . PORTACATH PLACEMENT Right 03/05/2019   Procedure: INSERTION PORT-A-CATH;  Surgeon: Fanny Skates, MD;  Location: [facility];   Service: General;  Laterality: Right;  . WRIST SURGERY      FAMILY HISTORY: Family History  Problem Relation Age of Onset  . Breast cancer Mother 11  . Cervical cancer Mother   . Heart attack Father   . Skin cancer Brother   . Breast cancer Paternal Aunt        dx over 55s  . Breast cancer Paternal Aunt        dx over 82  . Uterine cancer Paternal Aunt 12  . Cervical cancer Maternal Aunt   . Liver cancer Paternal Uncle 60  . Stroke Maternal Grandmother   . Parkinson's disease Paternal Grandfather   . Breast cancer Cousin 68       mother's maternal first cousin  . Melanoma Cousin 27       pat first cousin  Patient's father was 41 years old when he died from heart attack. Patient's mother died at age 39. She was diagnosed with breast cancer at age 24. Two paternal aunts were diagnosed with breast cancer at older ages, one of which also had uterine cancer. She has 1 brother. He has a history of skin cancer.    GYNECOLOGIC HISTORY:  No LMP recorded. Patient is postmenopausal. Menarche: 58 years old Age at first live birth: 58 years old Allison Hanna 3 LMP 2007 Contraceptive: yes, pill for about 8 years, no complications HRT no  Hysterectomy? no BSO? 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 She is married. [name] is in charge of operations for [employer]. She lives at home with her husband. [name], age 69, lives in [place], [place] as a Research scientist (life sciences). [name], age 64, lives in [place] as a Careers adviser. [name], age 2, lives in [place] as a Ship broker and works part-time in [employer]. She has no grandchildren.     ADVANCED DIRECTIVES: [name] is automatically her HCPOA.   HEALTH MAINTENANCE: Social History   Tobacco Use  . Smoking status: Never Smoker  . Smokeless tobacco: Never Used  Substance Use Topics  . Alcohol use: Yes    Alcohol/week:  2.0 standard drinks    Types: 2 Standard drinks or equivalent per week  . Drug use: Never     Colonoscopy: 2010  PAP: 08/2018  Bone density: 2019, osteopenia   No Known Allergies  Current Outpatient Medications  Medication Sig Dispense Refill  . calcium carbonate (OS-CAL) 600 MG TABS tablet Take 1,200 mg by mouth 2 (two) times daily with a meal.    . Cholecalciferol (VITAMIN D3) 25 MCG (1000 UT) CAPS Take 2,000 Units by mouth daily.    Marland Kitchen dexamethasone (DECADRON) 4 MG tablet Take one tablet the evening of chemo and twice a day thet next day, then twice a day as needed; take with food 20 tablet 0  . Levothyroxine Sodium (SYNTHROID PO) Take 75 mcg by mouth daily.     Marland Kitchen LORazepam (ATIVAN) 0.5 MG tablet Take one tablet by mouth every 8 hours as needed for nausea or insomnia 30 tablet 0  . Magnesium 250 MG TABS Take 250 mg by mouth daily.    . ondansetron (ZOFRAN) 8 MG tablet Take 1 tablet (8 mg total) by mouth  every 8 (eight) hours as needed for nausea or vomiting. 20 tablet 3  . prochlorperazine (COMPAZINE) 10 MG tablet Take 1 tablet (10 mg total) by mouth every 8 (eight) hours as needed for nausea or vomiting. 20 tablet 0  . valACYclovir (VALTREX) 500 MG tablet Take 1 tablet (500 mg total) by mouth 2 (two) times daily. 60 tablet 1  . vitamin C (ASCORBIC ACID) 500 MG tablet Take 500 mg by mouth daily.     No current facility-administered medications for this visit.    Facility-Administered Medications Ordered in Other Visits  Medication Dose Route Frequency Provider Last Rate Last Dose  . sodium chloride flush (NS) 0.9 % injection 10 mL  10 mL Intracatheter PRN Magrinat, Virgie Dad, MD        OBJECTIVE: Middle-aged white woman who appears stated age  Vitals:   06/02/19 1217  BP: (!) 151/82  Pulse: 60  Resp: 18  Temp: 98.3 F (36.8 C)  SpO2: 99%   Wt Readings from Last 3 Encounters:  06/02/19 157 lb 12.8 oz (71.6 kg)  05/27/19 155 lb 4 oz (70.4 kg)  05/25/19 159 lb (72.1 kg)    Body mass index is 24.71 kg/m.    ECOG FS:1 - Symptomatic but completely ambulatory  Ocular: Sclerae unicteric, pupils round and equal Ear-nose-throat: Wearing a mask Lymphatic: No cervical or supraclavicular adenopathy Lungs no rales or rhonchi Heart regular rate and rhythm Abd soft, nontender, positive bowel sounds MSK no focal spinal tenderness, no joint edema Neuro: non-focal, well-oriented, appropriate affect Breasts: Deferred   LAB RESULTS:  CMP     Component Value Date/Time   NA 134 (L) 05/20/2019 1522   K 4.2 05/20/2019 1522   CL 99 05/20/2019 1522   CO2 26 05/20/2019 1522   GLUCOSE 100 (H) 05/20/2019 1522   BUN 21 (H) 05/20/2019 1522   CREATININE 0.81 05/20/2019 1522   CREATININE 0.81 05/12/2019 1145   CALCIUM 8.6 (L) 05/20/2019 1522   PROT 6.7 05/20/2019 1522   ALBUMIN 3.8 05/20/2019 1522   AST 15 05/20/2019 1522   AST 19 05/12/2019 1145   ALT 12 05/20/2019 1522   ALT 16 05/12/2019 1145   ALKPHOS 54 05/20/2019 1522   BILITOT <0.2 (L) 05/20/2019 1522   BILITOT 0.3 05/12/2019 1145   GFRNONAA >60 05/20/2019 1522   GFRNONAA >60 05/12/2019 1145   GFRAA >60 05/20/2019 1522   GFRAA >60 05/12/2019 1145    No results found for: TOTALPROTELP, ALBUMINELP, A1GS, A2GS, BETS, BETA2SER, GAMS, MSPIKE, SPEI  No results found for: KPAFRELGTCHN, LAMBDASER, KAPLAMBRATIO  Lab Results  Component Value Date   WBC 3.2 (L) 06/02/2019   NEUTROABS 1.7 06/02/2019   HGB 11.2 (L) 06/02/2019   HCT 33.0 (L) 06/02/2019   MCV 91.2 06/02/2019   PLT 289 06/02/2019    @LASTCHEMISTRY @  No results found for: LABCA2  No components found for: LHTDSK876  No results for input(s): INR in the last 168 hours.  No results found for: LABCA2  No results found for: OTL572  No results found for: IOM355  No results found for: HRC163  No results found for: CA2729  No components found for: HGQUANT  No results found for: CEA1 / No results found for: CEA1   No results found for:  AFPTUMOR  No results found for: CHROMOGRNA  No results found for: PSA1  Appointment on 06/02/2019  Component Date Value Ref Range Status  . WBC 06/02/2019 3.2* 4.0 - 10.5 K/uL Final  . RBC 06/02/2019  3.62* 3.87 - 5.11 MIL/uL Final  . Hemoglobin 06/02/2019 11.2* 12.0 - 15.0 g/dL Final  . HCT 06/02/2019 33.0* 36.0 - 46.0 % Final  . MCV 06/02/2019 91.2  80.0 - 100.0 fL Final  . MCH 06/02/2019 30.9  26.0 - 34.0 pg Final  . MCHC 06/02/2019 33.9  30.0 - 36.0 g/dL Final  . RDW 06/02/2019 13.8  11.5 - 15.5 % Final  . Platelets 06/02/2019 289  150 - 400 K/uL Final  . nRBC 06/02/2019 0.0  0.0 - 0.2 % Final  . Neutrophils Relative % 06/02/2019 52  % Final  . Neutro Abs 06/02/2019 1.7  1.7 - 7.7 K/uL Final  . Lymphocytes Relative 06/02/2019 26  % Final  . Lymphs Abs 06/02/2019 0.8  0.7 - 4.0 K/uL Final  . Monocytes Relative 06/02/2019 15  % Final  . Monocytes Absolute 06/02/2019 0.5  0.1 - 1.0 K/uL Final  . Eosinophils Relative 06/02/2019 4  % Final  . Eosinophils Absolute 06/02/2019 0.1  0.0 - 0.5 K/uL Final  . Basophils Relative 06/02/2019 2  % Final  . Basophils Absolute 06/02/2019 0.1  0.0 - 0.1 K/uL Final  . Immature Granulocytes 06/02/2019 1  % Final  . Abs Immature Granulocytes 06/02/2019 0.02  0.00 - 0.07 K/uL Final   Performed at [facility], [place] [address], [place], [place] [address]    (this displays the last labs from the last 3 days)  No results found for: TOTALPROTELP, ALBUMINELP, A1GS, A2GS, BETS, BETA2SER, GAMS, MSPIKE, SPEI (this displays SPEP labs)  No results found for: KPAFRELGTCHN, LAMBDASER, KAPLAMBRATIO (kappa/lambda light chains)  No results found for: HGBA, HGBA2QUANT, HGBFQUANT, HGBSQUAN (Hemoglobinopathy evaluation)   No results found for: LDH  No results found for: IRON, TIBC, IRONPCTSAT (Iron and TIBC)  No results found for: FERRITIN  Urinalysis No results found for: COLORURINE, APPEARANCEUR, LABSPEC, PHURINE, GLUCOSEU,  HGBUR, BILIRUBINUR, KETONESUR, PROTEINUR, UROBILINOGEN, NITRITE, LEUKOCYTESUR   STUDIES: No results found.  ELIGIBLE FOR AVAILABLE RESEARCH PROTOCOL: no  ASSESSMENT: 58 y.o. [place] woman status post left breast biopsy 02/12/2019 for a clinical T1b N0, stage IA invasive ductal carcinoma, estrogen receptor positive, progesterone receptor negative, HER-2 amplified, with an MIB-1-1 of 25%  (1) status post left lumpectomy and sentinel lymph node sampling 03/05/2019 for a pT1c pN0, stage IA invasive ductal carcinoma, grade 2, with positive lymphovascular invasion but negative margins  (a) a total of 8 lymph nodes removed (4 sentinel)   (2) adjuvant chemotherapy with paclitaxel and trastuzumab weekly x12 started 04/07/2019  (a) paclitaxel stopped after 4 doses because of peripheral neuropathy  (b) cyclophosphamide, methotrexate, fluorouracil started 05/12/2019, to be repeated q. 21 days x 3  (3) trastuzumab to be continued to complete 1 year  (a) echo 02/26/2019 shows an ejection fraction in the 60-65% range  (4) adjuvant radiation to follow  (5) antiestrogens to start at the completion of local treatment  (6) genetics testing 02/25/2019 through the Multi-Gene Panel offered by [facility] found no deleterious mutations in AIP, ALK, APC, ATM, AXIN2,BAP1,  BARD1, BLM, BMPR1A, BRCA1, BRCA2, BRIP1, CASR, CDC73, CDH1, CDK4, CDKN1B, CDKN1C, CDKN2A (p14ARF), CDKN2A (p16INK4a), CEBPA, CHEK2, CTNNA1, DICER1, DIS3L2, EGFR (c.2369C>T, Hanna.Thr790Met variant only), EPCAM (Deletion/duplication testing only), FH, FLCN, GATA2, GPC3, GREM1 (Promoter region deletion/duplication testing only), HOXB13 (c.251G>A, Hanna.Gly84Glu), HRAS, KIT, MAX, MEN1, MET, MITF (c.952G>A, Hanna.Glu318Lys variant only), MLH1, MSH2, MSH3, MSH6, MUTYH, NBN, NF1, NF2, NTHL1, PALB2, PDGFRA, PHOX2B, PMS2, POLD1, POLE, POT1, PRKAR1A, PTCH1, PTEN, RAD50, RAD51C, RAD51D, RB1, RECQL4, RET,  RNF43, RUNX1, SDHAF2, SDHA (sequence changes only), SDHB, SDHC,  SDHD, SMAD4, SMARCA4, SMARCB1, SMARCE1, STK11, SUFU, TERC, TERT, TMEM127, TP53, TSC1, TSC2, VHL, WRN and WT1.      PLAN: Tijah will proceed to her second cycle of CMF today.  I think with the Compazine on board and the Decadron she will do much better as far as nausea is concerned.  She has an excellent exercise program and I have encouraged her to continue it.  She is already scheduled for simulation mid November.  The plan of course is to continue the trastuzumab every 3 weeks to complete 1 year  She is keeping appropriate pandemic precautions  She will see me again in 3 weeks.  She knows to call for any other issues that may develop before that visit.  Magrinat, Virgie Dad, MD  06/02/19 12:46 PM Medical Oncology and Hematology [facility] [place], [place] [phone] Tel. [phone]    Fax. [phone]  I, Jacqualyn Posey am acting as a Education administrator for Chauncey Cruel, MD.   I, [name] MD, have reviewed the above documentation for accuracy and completeness, and I agree with the above.

## 2019-06-02 NOTE — Progress Notes (Signed)
Patient inquired about getting flu vaccine today.  Per Dr. Jana Hakim, patient needs to wait until after completion of chemo, approximately 4-5 weeks. Patient verbalized understanding.

## 2019-06-02 NOTE — Patient Instructions (Signed)
[facility] Discharge Instructions for Patients Receiving Chemotherapy  Today you received the following chemotherapy agents:cyclophosphamide, methotrexate, and flourouracil.   To help prevent nausea and vomiting after your treatment, we encourage you to take your nausea medication as prescribed by your physician.    If you develop nausea and vomiting that is not controlled by your nausea medication, call the clinic.   BELOW ARE SYMPTOMS THAT SHOULD BE REPORTED IMMEDIATELY:  *FEVER GREATER THAN 100.5 F  *CHILLS WITH OR WITHOUT FEVER  NAUSEA AND VOMITING THAT IS NOT CONTROLLED WITH YOUR NAUSEA MEDICATION  *UNUSUAL SHORTNESS OF BREATH  *UNUSUAL BRUISING OR BLEEDING  TENDERNESS IN MOUTH AND THROAT WITH OR WITHOUT PRESENCE OF ULCERS  *URINARY PROBLEMS  *BOWEL PROBLEMS  UNUSUAL RASH Items with * indicate a potential emergency and should be followed up as soon as possible.  Feel free to call the clinic should you have any questions or concerns. The clinic phone number is [phone].  Please show the [facility] at check-in to the Emergency Department and triage nurse.

## 2019-06-07 ENCOUNTER — Other Ambulatory Visit: Payer: Self-pay | Admitting: Oncology

## 2019-06-09 ENCOUNTER — Other Ambulatory Visit: Payer: Self-pay

## 2019-06-09 ENCOUNTER — Encounter: Payer: Self-pay | Admitting: Podiatry

## 2019-06-09 ENCOUNTER — Ambulatory Visit: Payer: Managed Care, Other (non HMO) | Admitting: Podiatry

## 2019-06-09 ENCOUNTER — Other Ambulatory Visit: Payer: Self-pay | Admitting: Podiatry

## 2019-06-09 ENCOUNTER — Ambulatory Visit (INDEPENDENT_AMBULATORY_CARE_PROVIDER_SITE_OTHER): Payer: Managed Care, Other (non HMO)

## 2019-06-09 VITALS — BP 135/70

## 2019-06-09 DIAGNOSIS — M79671 Pain in right foot: Secondary | ICD-10-CM | POA: Diagnosis not present

## 2019-06-09 DIAGNOSIS — M722 Plantar fascial fibromatosis: Secondary | ICD-10-CM

## 2019-06-09 DIAGNOSIS — M2142 Flat foot [pes planus] (acquired), left foot: Secondary | ICD-10-CM

## 2019-06-09 DIAGNOSIS — M79672 Pain in left foot: Secondary | ICD-10-CM

## 2019-06-09 DIAGNOSIS — M2141 Flat foot [pes planus] (acquired), right foot: Secondary | ICD-10-CM | POA: Diagnosis not present

## 2019-06-09 DIAGNOSIS — M2042 Other hammer toe(s) (acquired), left foot: Secondary | ICD-10-CM

## 2019-06-09 DIAGNOSIS — M2041 Other hammer toe(s) (acquired), right foot: Secondary | ICD-10-CM

## 2019-06-10 ENCOUNTER — Encounter: Payer: Self-pay | Admitting: Podiatry

## 2019-06-10 NOTE — Progress Notes (Signed)
Subjective:  Patient ID: Allison Hanna, female    DOB: 03-03-1961,  MRN: [id number]  Chief Complaint  Patient presents with  . Foot Pain    pt is here for possible orthotics, pt is also here for possible corns and calluses, plantar fasciitis, as well as a possible hammer toe of the right second toe    58 y.o. female presents with the above complaint.  Patient is here to discuss about bilateral heel pain treatment continuous for plantar fasciitis.  And she also wants to discuss bilateral hammertoe digits that have been painful in nature.  She is a chemo patient and currently undergoing chemotherapy followed by radiation.  She is interested in surgical intervention in the future after she has completed her therapy.  Today she presents to discuss various treatment options and to obtain a new pair of orthotics.  Her last pair of orthotics were made about 7 years ago and they have been worn out.  She states the orthotics have tremendously helped her in terms of treating her plantar fasciitis.   Review of Systems: Negative except as noted in the HPI. Denies N/V/F/Ch.  Past Medical History:  Diagnosis Date  . Anxiety   . Family history of breast cancer   . Family history of melanoma   . High cholesterol   . Hypothyroid   . Paralyzed vocal cords 2014  . Raynauds syndrome    cold weather triggers fingers to turn white    Current Outpatient Medications:  .  calcium carbonate (OS-CAL) 600 MG TABS tablet, Take 1,200 mg by mouth 2 (two) times daily with a meal., Disp: , Rfl:  .  Cholecalciferol (VITAMIN D3) 25 MCG (1000 UT) CAPS, Take 2,000 Units by mouth daily., Disp: , Rfl:  .  dexamethasone (DECADRON) 4 MG tablet, Take one tablet the evening of chemo and twice a day thet next day, then twice a day as needed; take with food, Disp: 20 tablet, Rfl: 0 .  Levothyroxine Sodium (SYNTHROID PO), Take 75 mcg by mouth daily. , Disp: , Rfl:  .  LORazepam (ATIVAN) 0.5 MG tablet, Take one tablet by mouth  every 8 hours as needed for nausea or insomnia, Disp: 30 tablet, Rfl: 0 .  Magnesium 250 MG TABS, Take 250 mg by mouth daily., Disp: , Rfl:  .  ondansetron (ZOFRAN) 8 MG tablet, Take 1 tablet (8 mg total) by mouth every 8 (eight) hours as needed for nausea or vomiting., Disp: 20 tablet, Rfl: 3 .  prochlorperazine (COMPAZINE) 10 MG tablet, Take 1 tablet (10 mg total) by mouth every 8 (eight) hours as needed for nausea or vomiting., Disp: 20 tablet, Rfl: 0 .  valACYclovir (VALTREX) 500 MG tablet, TAKE 1 TABLET BY MOUTH TWICE A DAY, Disp: 60 tablet, Rfl: 1 .  vitamin C (ASCORBIC ACID) 500 MG tablet, Take 500 mg by mouth daily., Disp: , Rfl:  No current facility-administered medications for this visit.   Facility-Administered Medications Ordered in Other Visits:  .  sodium chloride flush (NS) 0.9 % injection 10 mL, 10 mL, Intracatheter, PRN, Magrinat, Virgie Dad, MD  Social History   Tobacco Use  Smoking Status Never Smoker  Smokeless Tobacco Never Used    No Known Allergies Objective:   Vitals:   06/09/19 1424  BP: 135/70   There is no height or weight on file to calculate BMI. Constitutional Well developed. Well nourished.  Vascular Dorsalis pedis pulses palpable bilaterally. Posterior tibial pulses palpable bilaterally. Capillary refill normal to all  digits.  No cyanosis or clubbing noted. Pedal hair growth normal.  Neurologic Normal speech. Oriented to person, place, and time. Epicritic sensation to light touch grossly present bilaterally.  Dermatologic Nails well groomed and normal in appearance. No open wounds. No skin lesions.  Orthopedic: Normal joint ROM without pain or crepitus bilaterally. No visible deformities. Tender to palpation at the calcaneal tuber bilaterally. No pain with calcaneal squeeze bilaterally. Ankle ROM diminished range of motion bilaterally. Silfverskiold Test: positive bilaterally.   Radiographs: Taken and reviewed. No acute fractures or  dislocations. No evidence of stress fracture.  Plantar heel spur present. Posterior heel spur present.  She also has pes planus deformity with radiographic signs of increasing intermetatarsal angle, Inc. decrease in talar declination angle, increase in Meary angle.  Hammertoe contractures noted of digits 2 through 4 bilaterally.  Assessment:   1. Foot pain, bilateral   2. Hammertoes of both feet   3. Pes planus of both feet   4. Bilateral plantar fasciitis    Plan:  Patient was evaluated and treated and all questions answered.  Plantar Fasciitis, bilaterally - XR reviewed as above.  -Today was more of an educational and various treatment options available visit.  No injections were given given that she is currently on chemotherapy and an increased risk of infection with any kind of the puncture through the skin. -We will conservatively manage her plantar fasciitis with orthotics and bracing at this time until she is ready for surgical intervention. -I extensively discussed various treatment options for plantar fasciitis including conservative and surgical treatment options.  Bilateral hammertoes of second digit -I explained extensively the etiology and the various treatment options for hammertoe contractures of bilateral second digits.  I explained to her that the underlying etiology is ultimately her pes planus deformity. -2 toe protectors were dispensed.  Bilateral pes planus deformity -She has a semi-flexible pes planus foot type with calcaneal eversion and mild to many toe signs. -Patient was sent to [facility] for custom made orthotics.  No follow-ups on file.

## 2019-06-14 ENCOUNTER — Other Ambulatory Visit: Payer: Self-pay | Admitting: Oncology

## 2019-06-21 ENCOUNTER — Other Ambulatory Visit: Payer: Self-pay | Admitting: Oncology

## 2019-06-22 ENCOUNTER — Other Ambulatory Visit: Payer: Self-pay | Admitting: Oncology

## 2019-06-22 NOTE — Progress Notes (Signed)
Allison Hanna  [phone] [phone]     ID: Allison Hanna DOB: Oct 23, 1960  MR#: [id number]  YOV#:[id number]  Patient Care Team: Allison Seashore, MD as PCP - General (Internal Medicine) Allison Kaufmann, RN as Oncology Nurse Navigator Allison Germany, RN as Oncology Nurse Navigator Allison Hanna, Allison Dad, MD as Consulting Physician (Oncology) Allison Pray, MD as Consulting Physician (Radiation Oncology) Allison Skates, MD as Consulting Physician (General Surgery) Allison Few, MD as Consulting Physician (Obstetrics and Gynecology) Allison Dresser, MD as Consulting Physician (Cardiology) Allison Hanna (Inactive) Allison Hanna, DPM as Consulting Physician (Podiatry) Allison Cruel, MD OTHER MD:  CHIEF COMPLAINT: Estrogen receptor positive breast cancer  CURRENT TREATMENT: Completing adjuvant chemotherapy; continuing immunotherapy   INTERVAL HISTORY: Allison Hanna returns today for follow-up and treatment of her estrogen receptor positive breast cancer.   She continues on adjuvant trastuzumab (Ogivri).  She tolerates this with no side effects that she is aware of.  She also continues on CMF. Today is day 1 cycle 3, which is her last cycle..  She had significant nausea with cycle #1 and we have added dexamethasone and Compazine t the cycle.  She then had significant constipation with cycle 2, possibly due to ondansetron  Allison Hanna's last echocardiogram on 05/27/2019, showed an ejection fraction in the 55% - 60% range.    She met with Allison Hanna in podiatry on 06/09/2019 for bilateral foot pain. She underwent right and left foot x-rays and was diagnosed with bilateral plantar fasciitis.   REVIEW OF SYSTEMS: Allison Hanna walks between 2 and 4 miles most days.  Even on rainy days as she goes out.  She had a rough first week after chemo but then she has felt "great" the last 2 weeks.  She still has some nausea at times in the morning.  It does not seem  to matter whether she eats or not.  But she has not had vomiting.  She is taking appropriate pandemic precautions.  She knows to call for any other issue that may develop before the next visit.   HISTORY OF CURRENT ILLNESS: From the original intake note:  "Allison Hanna" presented with a palpable left breast mass. She initially experienced soreness to her left breast around April 2020 and tried to get a mammogram at this point, but due to pandemic concerns, the scan was delayed.  She did not actually feel a mass in the breast until June 1.  She underwent bilateral diagnostic mammography with tomography and left breast ultrasonography at [facility] on 02/11/2019 showing: breast density category B; irregular hyperechoic mass in the left breast at 4 o'clock 5 cm from the nipple measuring 8 mm; no enlarged adenopathy in the left axilla.  Accordingly on 02/12/2019 she proceeded to biopsy of the left breast area in question. The pathology from this procedure ([id number]) showed: invasive mammary carcinoma, e-cadherin positive. Prognostic indicators significant for: estrogen receptor, 100% positive with strong staining intensity and progesterone receptor, 0% negative. Proliferation marker Ki67 at 25%. HER2 positive by immunohistochemistry, (3+).  The patient's subsequent history is as detailed below.   PAST MEDICAL HISTORY: Past Medical History:  Diagnosis Date   Anxiety    Family history of breast cancer    Family history of melanoma    High cholesterol    Hypothyroid    Paralyzed vocal cords 2014   Raynauds syndrome    cold weather triggers fingers to turn white  History of palpitations (resolved)   PAST SURGICAL HISTORY:  Past Surgical History:  Procedure Laterality Date   BREAST LUMPECTOMY WITH RADIOACTIVE SEED AND SENTINEL LYMPH NODE BIOPSY Left 03/05/2019   Procedure: LEFT BREAST LUMPECTOMY WITH RADIOACTIVE SEED AND LEFT AXILLARY DEEP SENTINEL LYMPH NODE BIOPSY INJECT BLUE DYE  LEFT BREAST;  Surgeon: Allison Skates, MD;  Location: [facility];  Service: General;  Laterality: Left;   KNEE SURGERY Bilateral    arthroscopy for menicus tears   PORTACATH PLACEMENT Right 03/05/2019   Procedure: INSERTION PORT-A-CATH;  Surgeon: Allison Skates, MD;  Location: [facility];  Service: General;  Laterality: Right;   WRIST SURGERY      FAMILY HISTORY: Family History  Problem Relation Age of Onset   Breast cancer Mother 45   Cervical cancer Mother    Heart attack Father    Skin cancer Brother    Breast cancer Paternal Aunt        dx over 55s   Breast cancer Paternal Aunt        dx over 47   Uterine cancer Paternal Aunt 57   Cervical cancer Maternal Aunt    Liver cancer Paternal Uncle 62   Stroke Maternal Grandmother    Parkinson's disease Paternal Grandfather    Breast cancer Cousin 75       mother's maternal first cousin   Melanoma Cousin 59       pat first cousin  Patient's father was 8 years old when he died from heart attack. Patient's mother died at age 7. She was diagnosed with breast cancer at age 5. Two paternal aunts were diagnosed with breast cancer at older ages, one of which also had uterine cancer. She has 1 brother. He has a history of skin cancer.    GYNECOLOGIC HISTORY:  No LMP recorded. Patient is postmenopausal. Menarche: 58 years old Age at first live birth: 59 years old [place] P 3 LMP 2007 Contraceptive: yes, pill for about 8 years, no complications HRT no  Hysterectomy? no BSO? 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 She is married. Husband Allison Hanna is in charge of operations for [employer]. She lives at home with her husband. Daughter Allison Hanna, age 11, lives in [place] as a Research scientist (life sciences). Daughter Allison Hanna, age 47, lives in [place] as a Careers adviser. Son Allison Hanna, age 87, lives in [place] as a  Ship broker and works part-time in [employer]. She has no grandchildren.     ADVANCED DIRECTIVES: Husband Allison Hanna is automatically her HCPOA.   HEALTH MAINTENANCE: Social History   Tobacco Use   Smoking status: Never Smoker   Smokeless tobacco: Never Used  Substance Use Topics   Alcohol use: Yes    Alcohol/week: 2.0 standard drinks    Types: 2 Standard drinks or equivalent per week   Drug use: Never     Colonoscopy: 2010  PAP: 08/2018  Bone density: 2019, osteopenia   No Known Allergies  Current Outpatient Medications  Medication Sig Dispense Refill   calcium carbonate (OS-CAL) 600 MG TABS tablet Take 1,200 mg by mouth 2 (two) times daily with a meal.     Cholecalciferol (VITAMIN D3) 25 MCG (1000 UT) CAPS Take 2,000 Units by mouth daily.     dexamethasone (DECADRON) 4 MG tablet TAKE 1 TAB THE EVENING OF CHEMO & TWICE A DAY THE NEXT DAY, THEN TWICE A DAY AS NEEDED TAKE W/ FOOD 20 tablet 0   Levothyroxine Sodium (  SYNTHROID PO) Take 75 mcg by mouth daily.      LORazepam (ATIVAN) 0.5 MG tablet Take one tablet by mouth every 8 hours as needed for nausea or insomnia 30 tablet 0   Magnesium 250 MG TABS Take 250 mg by mouth daily.     ondansetron (ZOFRAN) 8 MG tablet Take 1 tablet (8 mg total) by mouth every 8 (eight) hours as needed for nausea or vomiting. 20 tablet 3   prochlorperazine (COMPAZINE) 10 MG tablet Take 1 tablet (10 mg total) by mouth every 8 (eight) hours as needed for nausea or vomiting. 20 tablet 0   valACYclovir (VALTREX) 500 MG tablet TAKE 1 TABLET BY MOUTH TWICE A DAY 60 tablet 1   vitamin C (ASCORBIC ACID) 500 MG tablet Take 500 mg by mouth daily.     No current facility-administered medications for this visit.    Facility-Administered Medications Ordered in Other Visits  Medication Dose Route Frequency Provider Last Rate Last Dose   sodium chloride flush (NS) 0.9 % injection 10 mL  10 mL Intracatheter PRN Rashed Edler, Allison Dad, MD        OBJECTIVE:  Middle-aged white woman in no acute distress  Vitals:   06/23/19 1218  BP: (!) 153/84  Pulse: 66  Resp: 18  Temp: 98.3 F (36.8 C)  SpO2: 100%   Wt Readings from Last 3 Encounters:  06/23/19 158 lb 4.8 oz (71.8 kg)  06/02/19 157 lb 12.8 oz (71.6 kg)  05/27/19 155 lb 4 oz (70.4 kg)   Body mass index is 24.79 kg/m.    ECOG FS:1 - Symptomatic but completely ambulatory  Sclerae unicteric, EOMs intact Wearing a mask No cervical or supraclavicular adenopathy Lungs no rales or rhonchi Heart regular rate and rhythm Abd soft, nontender, positive bowel sounds MSK no focal spinal tenderness, no upper extremity lymphedema Neuro: nonfocal, well oriented, appropriate affect Breasts: The right breast is benign per the left breast is status post lumpectomy.  There is no evidence of disease activity.  Both axillae are benign.   LAB RESULTS:  CMP     Component Value Date/Time   NA 133 (L) 06/02/2019 1208   K 4.2 06/02/2019 1208   CL 98 06/02/2019 1208   CO2 25 06/02/2019 1208   GLUCOSE 94 06/02/2019 1208   BUN 20 06/02/2019 1208   CREATININE 0.75 06/02/2019 1208   CREATININE 0.81 05/12/2019 1145   CALCIUM 9.0 06/02/2019 1208   PROT 7.1 06/02/2019 1208   ALBUMIN 3.9 06/02/2019 1208   AST 18 06/02/2019 1208   AST 19 05/12/2019 1145   ALT 15 06/02/2019 1208   ALT 16 05/12/2019 1145   ALKPHOS 54 06/02/2019 1208   BILITOT 0.3 06/02/2019 1208   BILITOT 0.3 05/12/2019 1145   GFRNONAA >60 06/02/2019 1208   GFRNONAA >60 05/12/2019 1145   GFRAA >60 06/02/2019 1208   GFRAA >60 05/12/2019 1145    No results found for: TOTALPROTELP, ALBUMINELP, A1GS, A2GS, BETS, BETA2SER, GAMS, MSPIKE, SPEI  No results found for: KPAFRELGTCHN, LAMBDASER, KAPLAMBRATIO  Lab Results  Component Value Date   WBC 2.7 (L) 06/23/2019   NEUTROABS 1.4 (L) 06/23/2019   HGB 10.9 (L) 06/23/2019   HCT 32.3 (L) 06/23/2019   MCV 93.4 06/23/2019   PLT 252 06/23/2019    @LASTCHEMISTRY @  No results found  for: LABCA2  No components found for: QZESPQ330  No results for input(s): INR in the last 168 hours.  No results found for: LABCA2  No results found for:  VOJ500  No results found for: CAN125  No results found for: XFG182  No results found for: CA2729  No components found for: HGQUANT  No results found for: CEA1 / No results found for: CEA1   No results found for: AFPTUMOR  No results found for: CHROMOGRNA  No results found for: PSA1  Appointment on 06/23/2019  Component Date Value Ref Range Status   WBC 06/23/2019 2.7* 4.0 - 10.5 K/uL Final   RBC 06/23/2019 3.46* 3.87 - 5.11 MIL/uL Final   Hemoglobin 06/23/2019 10.9* 12.0 - 15.0 g/dL Final   HCT 06/23/2019 32.3* 36.0 - 46.0 % Final   MCV 06/23/2019 93.4  80.0 - 100.0 fL Final   MCH 06/23/2019 31.5  26.0 - 34.0 pg Final   MCHC 06/23/2019 33.7  30.0 - 36.0 g/dL Final   RDW 06/23/2019 13.6  11.5 - 15.5 % Final   Platelets 06/23/2019 252  150 - 400 K/uL Final   nRBC 06/23/2019 0.0  0.0 - 0.2 % Final   Neutrophils Relative % 06/23/2019 51  % Final   Neutro Abs 06/23/2019 1.4* 1.7 - 7.7 K/uL Final   Lymphocytes Relative 06/23/2019 28  % Final   Lymphs Abs 06/23/2019 0.7  0.7 - 4.0 K/uL Final   Monocytes Relative 06/23/2019 16  % Final   Monocytes Absolute 06/23/2019 0.4  0.1 - 1.0 K/uL Final   Eosinophils Relative 06/23/2019 3  % Final   Eosinophils Absolute 06/23/2019 0.1  0.0 - 0.5 K/uL Final   Basophils Relative 06/23/2019 2  % Final   Basophils Absolute 06/23/2019 0.1  0.0 - 0.1 K/uL Final   Immature Granulocytes 06/23/2019 0  % Final   Abs Immature Granulocytes 06/23/2019 0.01  0.00 - 0.07 K/uL Final   Performed at [facility], [place] [address], [place], [place] [address]    (this displays the last labs from the last 3 days)  No results found for: TOTALPROTELP, ALBUMINELP, A1GS, A2GS, BETS, BETA2SER, GAMS, MSPIKE, SPEI (this displays SPEP labs)  No results found  for: KPAFRELGTCHN, LAMBDASER, KAPLAMBRATIO (kappa/lambda light chains)  No results found for: HGBA, HGBA2QUANT, HGBFQUANT, HGBSQUAN (Hemoglobinopathy evaluation)   No results found for: LDH  No results found for: IRON, TIBC, IRONPCTSAT (Iron and TIBC)  No results found for: FERRITIN  Urinalysis No results found for: COLORURINE, APPEARANCEUR, LABSPEC, PHURINE, GLUCOSEU, HGBUR, BILIRUBINUR, KETONESUR, PROTEINUR, UROBILINOGEN, NITRITE, LEUKOCYTESUR   STUDIES: Dg Foot 2 Views Left  Result Date: 06/09/2019 Please see detailed radiograph report in office note.  Dg Foot 2 Views Right  Result Date: 06/09/2019 Please see detailed radiograph report in office note.   ELIGIBLE FOR AVAILABLE RESEARCH PROTOCOL: no  ASSESSMENT: 58 y.o. [place] woman status post left breast biopsy 02/12/2019 for a clinical T1b N0, stage IA invasive ductal carcinoma, estrogen receptor positive, progesterone receptor negative, HER-2 amplified, with an MIB-1-1 of 25%  (1) status post left lumpectomy and sentinel lymph node sampling 03/05/2019 for a pT1c pN0, stage IA invasive ductal carcinoma, grade 2, with positive lymphovascular invasion but negative margins  (a) a total of 8 lymph nodes removed (4 sentinel)   (2) adjuvant chemotherapy with paclitaxel and trastuzumab weekly x12 started 04/07/2019  (a) paclitaxel stopped after 4 doses because of peripheral neuropathy  (b) cyclophosphamide, methotrexate, fluorouracil started 05/12/2019, to be repeated q. 21 days x 3  (3) trastuzumab to be continued to complete 1 year  (a) echo 02/26/2019 shows an ejection fraction in the 60-65% range  (b) echo 05/27/2019 shows  an EF of 55-60%  (4) adjuvant radiation to follow  (5) antiestrogens to start at the completion of local treatment  (6) genetics testing 02/25/2019 through the Multi-Gene Panel offered by [facility] found no deleterious mutations in AIP, ALK, APC, ATM, AXIN2,BAP1,  BARD1, BLM, BMPR1A, BRCA1, BRCA2,  BRIP1, CASR, CDC73, CDH1, CDK4, CDKN1B, CDKN1C, CDKN2A (p14ARF), CDKN2A (p16INK4a), CEBPA, CHEK2, CTNNA1, DICER1, DIS3L2, EGFR (c.2369C>T, p.Thr790Met variant only), EPCAM (Deletion/duplication testing only), FH, FLCN, GATA2, GPC3, GREM1 (Promoter region deletion/duplication testing only), HOXB13 (c.251G>A, p.Gly84Glu), HRAS, KIT, MAX, MEN1, MET, MITF (c.952G>A, p.Glu318Lys variant only), MLH1, MSH2, MSH3, MSH6, MUTYH, NBN, NF1, NF2, NTHL1, PALB2, PDGFRA, PHOX2B, PMS2, POLD1, POLE, POT1, PRKAR1A, PTCH1, PTEN, RAD50, RAD51C, RAD51D, RB1, RECQL4, RET, RNF43, RUNX1, SDHAF2, SDHA (sequence changes only), SDHB, SDHC, SDHD, SMAD4, SMARCA4, SMARCB1, SMARCE1, STK11, SUFU, TERC, TERT, TMEM127, TP53, TSC1, TSC2, VHL, WRN and WT1.      PLAN: Allison Hanna will receive CMF today and this is the end of her chemotherapy.  She will get the "ring the bell".  Of course she is going to continue to receive trastuzumab all the way through August of next year.  She missed a dose of trastuzumab with her second cycle of CMF and that will be made up at the end.  She is already scheduled to meet with Dr. Randa Ngo to start radiation late November.  She will return to see me in January with one of her trastuzumab treatments.  At that time we will discuss starting an antiestrogen  She knows to call for any other issues that may develop before then.  Alyscia Carmon, Allison Dad, MD  06/23/19 12:39 PM Medical Oncology and Hematology [facility] [place], [place] [address] Tel. [phone]    Fax. [phone]   I, Wilburn Mylar, am acting as scribe for Dr. Virgie Hanna. Haeven Nickle.  I, [name] MD, have reviewed the above documentation for accuracy and completeness, and I agree with the above.

## 2019-06-23 ENCOUNTER — Inpatient Hospital Stay: Payer: Managed Care, Other (non HMO)

## 2019-06-23 ENCOUNTER — Other Ambulatory Visit: Payer: Self-pay

## 2019-06-23 ENCOUNTER — Inpatient Hospital Stay (HOSPITAL_BASED_OUTPATIENT_CLINIC_OR_DEPARTMENT_OTHER): Payer: Managed Care, Other (non HMO) | Admitting: Oncology

## 2019-06-23 ENCOUNTER — Telehealth: Payer: Self-pay | Admitting: *Deleted

## 2019-06-23 ENCOUNTER — Other Ambulatory Visit: Payer: Self-pay | Admitting: Oncology

## 2019-06-23 VITALS — BP 153/84 | HR 66 | Temp 98.3°F | Resp 18 | Ht 67.0 in | Wt 158.3 lb

## 2019-06-23 DIAGNOSIS — Z95828 Presence of other vascular implants and grafts: Secondary | ICD-10-CM

## 2019-06-23 DIAGNOSIS — Z170 Estrogen receptor positive status [ER+]: Secondary | ICD-10-CM

## 2019-06-23 DIAGNOSIS — C50512 Malignant neoplasm of lower-outer quadrant of left female breast: Secondary | ICD-10-CM

## 2019-06-23 LAB — CBC WITH DIFFERENTIAL/PLATELET
Abs Immature Granulocytes: 0.01 10*3/uL (ref 0.00–0.07)
Basophils Absolute: 0.1 10*3/uL (ref 0.0–0.1)
Basophils Relative: 2 %
Eosinophils Absolute: 0.1 10*3/uL (ref 0.0–0.5)
Eosinophils Relative: 3 %
HCT: 32.3 % — ABNORMAL LOW (ref 36.0–46.0)
Hemoglobin: 10.9 g/dL — ABNORMAL LOW (ref 12.0–15.0)
Immature Granulocytes: 0 %
Lymphocytes Relative: 28 %
Lymphs Abs: 0.7 10*3/uL (ref 0.7–4.0)
MCH: 31.5 pg (ref 26.0–34.0)
MCHC: 33.7 g/dL (ref 30.0–36.0)
MCV: 93.4 fL (ref 80.0–100.0)
Monocytes Absolute: 0.4 10*3/uL (ref 0.1–1.0)
Monocytes Relative: 16 %
Neutro Abs: 1.4 10*3/uL — ABNORMAL LOW (ref 1.7–7.7)
Neutrophils Relative %: 51 %
Platelets: 252 10*3/uL (ref 150–400)
RBC: 3.46 MIL/uL — ABNORMAL LOW (ref 3.87–5.11)
RDW: 13.6 % (ref 11.5–15.5)
WBC: 2.7 10*3/uL — ABNORMAL LOW (ref 4.0–10.5)
nRBC: 0 % (ref 0.0–0.2)

## 2019-06-23 LAB — COMPREHENSIVE METABOLIC PANEL
ALT: 25 U/L (ref 0–44)
AST: 22 U/L (ref 15–41)
Albumin: 3.7 g/dL (ref 3.5–5.0)
Alkaline Phosphatase: 52 U/L (ref 38–126)
Anion gap: 10 (ref 5–15)
BUN: 20 mg/dL (ref 6–20)
CO2: 24 mmol/L (ref 22–32)
Calcium: 8.8 mg/dL — ABNORMAL LOW (ref 8.9–10.3)
Chloride: 99 mmol/L (ref 98–111)
Creatinine, Ser: 0.78 mg/dL (ref 0.44–1.00)
GFR calc Af Amer: >60 mL/min (ref >60)
GFR calc non Af Amer: >60 mL/min (ref >60)
Glucose, Bld: 98 mg/dL (ref 70–99)
Potassium: 4.2 mmol/L (ref 3.5–5.1)
Sodium: 133 mmol/L — ABNORMAL LOW (ref 135–145)
Total Bilirubin: 0.3 mg/dL (ref 0.3–1.2)
Total Protein: 6.8 g/dL (ref 6.5–8.1)

## 2019-06-23 MED ORDER — DIPHENHYDRAMINE HCL 25 MG PO CAPS
25.0000 mg | ORAL_CAPSULE | Freq: Once | ORAL | Status: AC
  Administered 2019-06-23: 25 mg via ORAL

## 2019-06-23 MED ORDER — METHOTREXATE SODIUM (PF) CHEMO INJECTION 250 MG/10ML
42.2000 mg/m2 | Freq: Once | INTRAMUSCULAR | Status: AC
  Administered 2019-06-23: 75 mg via INTRAVENOUS
  Filled 2019-06-23: qty 3

## 2019-06-23 MED ORDER — FLUOROURACIL CHEMO INJECTION 2.5 GM/50ML
600.0000 mg/m2 | Freq: Once | INTRAVENOUS | Status: AC
  Administered 2019-06-23: 1050 mg via INTRAVENOUS
  Filled 2019-06-23: qty 21

## 2019-06-23 MED ORDER — ACETAMINOPHEN 325 MG PO TABS
650.0000 mg | ORAL_TABLET | Freq: Once | ORAL | Status: AC
  Administered 2019-06-23: 650 mg via ORAL

## 2019-06-23 MED ORDER — TRASTUZUMAB-DKST CHEMO 150 MG IV SOLR
450.0000 mg | Freq: Once | INTRAVENOUS | Status: AC
  Administered 2019-06-23: 450 mg via INTRAVENOUS
  Filled 2019-06-23: qty 21.43

## 2019-06-23 MED ORDER — PALONOSETRON HCL INJECTION 0.25 MG/5ML
INTRAVENOUS | Status: AC
  Filled 2019-06-23: qty 5

## 2019-06-23 MED ORDER — HEPARIN SOD (PORK) LOCK FLUSH 100 UNIT/ML IV SOLN
500.0000 [IU] | Freq: Once | INTRAVENOUS | Status: AC | PRN
  Administered 2019-06-23: 500 [IU]
  Filled 2019-06-23: qty 5

## 2019-06-23 MED ORDER — SODIUM CHLORIDE 0.9 % IV SOLN
Freq: Once | INTRAVENOUS | Status: AC
  Administered 2019-06-23: 13:00:00 via INTRAVENOUS
  Filled 2019-06-23: qty 250

## 2019-06-23 MED ORDER — SODIUM CHLORIDE 0.9 % IV SOLN
600.0000 mg/m2 | Freq: Once | INTRAVENOUS | Status: AC
  Administered 2019-06-23: 1060 mg via INTRAVENOUS
  Filled 2019-06-23: qty 53

## 2019-06-23 MED ORDER — ALTEPLASE 2 MG IJ SOLR
INTRAMUSCULAR | Status: AC
  Filled 2019-06-23: qty 2

## 2019-06-23 MED ORDER — ACETAMINOPHEN 325 MG PO TABS
ORAL_TABLET | ORAL | Status: AC
  Filled 2019-06-23: qty 2

## 2019-06-23 MED ORDER — SODIUM CHLORIDE 0.9% FLUSH
10.0000 mL | INTRAVENOUS | Status: DC | PRN
  Administered 2019-06-23: 10 mL
  Filled 2019-06-23: qty 10

## 2019-06-23 MED ORDER — DIPHENHYDRAMINE HCL 25 MG PO CAPS
ORAL_CAPSULE | ORAL | Status: AC
  Filled 2019-06-23: qty 1

## 2019-06-23 MED ORDER — PALONOSETRON HCL INJECTION 0.25 MG/5ML
0.2500 mg | Freq: Once | INTRAVENOUS | Status: AC
  Administered 2019-06-23: 0.25 mg via INTRAVENOUS

## 2019-06-23 MED ORDER — SODIUM CHLORIDE 0.9 % IV SOLN
Freq: Once | INTRAVENOUS | Status: AC
  Administered 2019-06-23: 14:00:00 via INTRAVENOUS
  Filled 2019-06-23: qty 5

## 2019-06-23 NOTE — Telephone Encounter (Signed)
Called pt to congratulate on final chemo. Denies questions or concerns at this time. Encourage pt to call with needs. Received verbal understanding.

## 2019-06-23 NOTE — Patient Instructions (Signed)
[name] Discharge Instructions for Patients Receiving Chemotherapy  Today you received the following chemotherapy agents: trastuzumab, cyclophosphamide, methotrexate, and flourouracil.   To help prevent nausea and vomiting after your treatment, we encourage you to take your nausea medication as prescribed by your physician.    If you develop nausea and vomiting that is not controlled by your nausea medication, call the clinic.   BELOW ARE SYMPTOMS THAT SHOULD BE REPORTED IMMEDIATELY:  *FEVER GREATER THAN 100.5 F  *CHILLS WITH OR WITHOUT FEVER  NAUSEA AND VOMITING THAT IS NOT CONTROLLED WITH YOUR NAUSEA MEDICATION  *UNUSUAL SHORTNESS OF BREATH  *UNUSUAL BRUISING OR BLEEDING  TENDERNESS IN MOUTH AND THROAT WITH OR WITHOUT PRESENCE OF ULCERS  *URINARY PROBLEMS  *BOWEL PROBLEMS  UNUSUAL RASH Items with * indicate a potential emergency and should be followed up as soon as possible.  Feel free to call the clinic should you have any questions or concerns. The clinic phone number is [phone].  Please show the [facility] at check-in to the Emergency Department and triage nurse.  Methotrexate injection What is this medicine? METHOTREXATE (METH oh TREX ate) is a chemotherapy drug used to treat cancer including breast cancer, leukemia, and lymphoma. This medicine can also be used to treat psoriasis and certain kinds of arthritis. This medicine may be used for other purposes; ask your health care provider or pharmacist if you have questions. What should I tell my health care provider before I take this medicine? They need to know if you have any of these conditions:  fluid in the stomach area or lungs  if you often drink alcohol  infection or immune system problems  kidney disease  liver disease  low blood counts, like low white cell, platelet, or red cell counts  lung disease  radiation therapy  stomach ulcers  ulcerative colitis  an  unusual or allergic reaction to methotrexate, other medicines, foods, dyes, or preservatives  pregnant or trying to get pregnant  breast-feeding How should I use this medicine? This medicine is for infusion into a vein or for injection into muscle or into the spinal fluid (whichever applies). It is usually given by a health care professional in a hospital or clinic setting. In rare cases, you might get this medicine at home. You will be taught how to give this medicine. Use exactly as directed. Take your medicine at regular intervals. Do not take your medicine more often than directed. If this medicine is used for arthritis or psoriasis, it should be taken weekly, NOT daily. It is important that you put your used needles and syringes in a special sharps container. Do not put them in a trash can. If you do not have a sharps container, call your pharmacist or healthcare provider to get one. Talk to your pediatrician regarding the use of this medicine in children. While this drug may be prescribed for children as young as 2 years for selected conditions, precautions do apply. Overdosage: If you think you have taken too much of this medicine contact a poison control center or emergency room at once. NOTE: This medicine is only for you. Do not share this medicine with others. What if I miss a dose? It is important not to miss your dose. Call your doctor or health care professional if you are unable to keep an appointment. If you give yourself the medicine and you miss a dose, talk with your doctor or health care professional. Do not take double or extra  doses. What may interact with this medicine? This medicine may interact with the following medications:  acitretin  aspirin or aspirin-like medicines including salicylates  azathioprine  certain antibiotics like chloramphenicol, penicillin, tetracycline  certain medicines for stomach problems like esomeprazole, omeprazole,  pantoprazole  cyclosporine  gold  hydroxychloroquine  live virus vaccines  mercaptopurine  NSAIDs, medicines for pain and inflammation, like ibuprofen or naproxen  other cytotoxic agents  penicillamine  phenylbutazone  phenytoin  probenacid  retinoids such as isotretinoin and tretinoin  steroid medicines like prednisone or cortisone  sulfonamides like sulfasalazine and trimethoprim/sulfamethoxazole  theophylline This list may not describe all possible interactions. Give your health care provider a list of all the medicines, herbs, non-prescription drugs, or dietary supplements you use. Also tell them if you smoke, drink alcohol, or use illegal drugs. Some items may interact with your medicine. What should I watch for while using this medicine? Avoid alcoholic drinks. In some cases, you may be given additional medicines to help with side effects. Follow all directions for their use. This medicine can make you more sensitive to the sun. Keep out of the sun. If you cannot avoid being in the sun, wear protective clothing and use sunscreen. Do not use sun lamps or tanning beds/booths. You may get drowsy or dizzy. Do not drive, use machinery, or do anything that needs mental alertness until you know how this medicine affects you. Do not stand or sit up quickly, especially if you are an older patient. This reduces the risk of dizzy or fainting spells. You may need blood work done while you are taking this medicine. Call your doctor or health care professional for advice if you get a fever, chills or sore throat, or other symptoms of a cold or flu. Do not treat yourself. This drug decreases your body's ability to fight infections. Try to avoid being around people who are sick. This medicine may increase your risk to bruise or bleed. Call your doctor or health care professional if you notice any unusual bleeding. Check with your doctor or health care professional if you get an attack  of severe diarrhea, nausea and vomiting, or if you sweat a lot. The loss of too much body fluid can make it dangerous for you to take this medicine. Talk to your doctor about your risk of cancer. You may be more at risk for certain types of cancers if you take this medicine. Both men and women must use effective birth control with this medicine. Do not become pregnant while taking this medicine or until at least 1 normal menstrual cycle has occurred after stopping it. Women should inform their doctor if they wish to become pregnant or think they might be pregnant. Men should not father a child while taking this medicine and for 3 months after stopping it. There is a potential for serious side effects to an unborn child. Talk to your health care professional or pharmacist for more information. Do not breast-feed an infant while taking this medicine. What side effects may I notice from receiving this medicine? Side effects that you should report to your doctor or health care professional as soon as possible:  allergic reactions like skin rash, itching or hives, swelling of the face, lips, or tongue  back pain  breathing problems or shortness of breath  confusion  diarrhea  dry, nonproductive cough  low blood counts - this medicine may decrease the number of white blood cells, red blood cells and platelets. You may be at  increased risk of infections and bleeding  mouth sores  redness, blistering, peeling or loosening of the skin, including inside the mouth  seizures  severe headaches  signs of infection - fever or chills, cough, sore throat, pain or difficulty passing urine  signs and symptoms of bleeding such as bloody or black, tarry stools; red or dark-brown urine; spitting up blood or brown material that looks like coffee grounds; red spots on the skin; unusual bruising or bleeding from the eye, gums, or nose  signs and symptoms of kidney injury like trouble passing urine or change  in the amount of urine  signs and symptoms of liver injury like dark yellow or brown urine; general ill feeling or flu-like symptoms; light-colored stools; loss of appetite; nausea; right upper belly pain; unusually weak or tired; yellowing of the eyes or skin  stiff neck  vomiting Side effects that usually do not require medical attention (report to your doctor or health care professional if they continue or are bothersome):  dizziness  hair loss  headache  stomach pain  upset stomach This list may not describe all possible side effects. Call your doctor for medical advice about side effects. You may report side effects to FDA at 1-800-FDA-1088. Where should I keep my medicine? If you are using this medicine at home, you will be instructed on how to store this medicine. Throw away any unused medicine after the expiration date on the label. NOTE: This sheet is a summary. It may not cover all possible information. If you have questions about this medicine, talk to your doctor, pharmacist, or health care provider.  2020 Elsevier/Gold Standard (2017-04-04 13:31:42)  Cyclophosphamide injection What is this medicine? CYCLOPHOSPHAMIDE (sye kloe FOSS fa mide) is a chemotherapy drug. It slows the growth of cancer cells. This medicine is used to treat many types of cancer like lymphoma, myeloma, leukemia, breast cancer, and ovarian cancer, to name a few. This medicine may be used for other purposes; ask your health care provider or pharmacist if you have questions. COMMON BRAND NAME(S): Cytoxan, Neosar What should I tell my health care provider before I take this medicine? They need to know if you have any of these conditions:  blood disorders  history of other chemotherapy  infection  kidney disease  liver disease  recent or ongoing radiation therapy  tumors in the bone marrow  an unusual or allergic reaction to cyclophosphamide, other chemotherapy, other medicines, foods,  dyes, or preservatives  pregnant or trying to get pregnant  breast-feeding How should I use this medicine? This drug is usually given as an injection into a vein or muscle or by infusion into a vein. It is administered in a hospital or clinic by a specially trained health care professional. Talk to your pediatrician regarding the use of this medicine in children. Special care may be needed. Overdosage: If you think you have taken too much of this medicine contact a poison control center or emergency room at once. NOTE: This medicine is only for you. Do not share this medicine with others. What if I miss a dose? It is important not to miss your dose. Call your doctor or health care professional if you are unable to keep an appointment. What may interact with this medicine? This medicine may interact with the following medications:  amiodarone  amphotericin B  azathioprine  certain antiviral medicines for HIV or AIDS such as protease inhibitors (e.g., indinavir, ritonavir) and zidovudine  certain blood pressure medications such as benazepril,  captopril, enalapril, fosinopril, lisinopril, moexipril, monopril, perindopril, quinapril, ramipril, trandolapril  certain cancer medications such as anthracyclines (e.g., daunorubicin, doxorubicin), busulfan, cytarabine, paclitaxel, pentostatin, tamoxifen, trastuzumab  certain diuretics such as chlorothiazide, chlorthalidone, hydrochlorothiazide, indapamide, metolazone  certain medicines that treat or prevent blood clots like warfarin  certain muscle relaxants such as succinylcholine  cyclosporine  etanercept  indomethacin  medicines to increase blood counts like filgrastim, pegfilgrastim, sargramostim  medicines used as general anesthesia  metronidazole  natalizumab This list may not describe all possible interactions. Give your health care provider a list of all the medicines, herbs, non-prescription drugs, or dietary supplements  you use. Also tell them if you smoke, drink alcohol, or use illegal drugs. Some items may interact with your medicine. What should I watch for while using this medicine? Visit your doctor for checks on your progress. This drug may make you feel generally unwell. This is not uncommon, as chemotherapy can affect healthy cells as well as cancer cells. Report any side effects. Continue your course of treatment even though you feel ill unless your doctor tells you to stop. Drink water or other fluids as directed. Urinate often, even at night. In some cases, you may be given additional medicines to help with side effects. Follow all directions for their use. Call your doctor or health care professional for advice if you get a fever, chills or sore throat, or other symptoms of a cold or flu. Do not treat yourself. This drug decreases your body's ability to fight infections. Try to avoid being around people who are sick. This medicine may increase your risk to bruise or bleed. Call your doctor or health care professional if you notice any unusual bleeding. Be careful brushing and flossing your teeth or using a toothpick because you may get an infection or bleed more easily. If you have any dental work done, tell your dentist you are receiving this medicine. You may get drowsy or dizzy. Do not drive, use machinery, or do anything that needs mental alertness until you know how this medicine affects you. Do not become pregnant while taking this medicine or for 1 year after stopping it. Women should inform their doctor if they wish to become pregnant or think they might be pregnant. Men should not father a child while taking this medicine and for 4 months after stopping it. There is a potential for serious side effects to an unborn child. Talk to your health care professional or pharmacist for more information. Do not breast-feed an infant while taking this medicine. This medicine may interfere with the ability to  have a child. This medicine has caused ovarian failure in some women. This medicine has caused reduced sperm counts in some men. You should talk with your doctor or health care professional if you are concerned about your fertility. If you are going to have surgery, tell your doctor or health care professional that you have taken this medicine. What side effects may I notice from receiving this medicine? Side effects that you should report to your doctor or health care professional as soon as possible:  allergic reactions like skin rash, itching or hives, swelling of the face, lips, or tongue  low blood counts - this medicine may decrease the number of white blood cells, red blood cells and platelets. You may be at increased risk for infections and bleeding.  signs of infection - fever or chills, cough, sore throat, pain or difficulty passing urine  signs of decreased platelets or bleeding - bruising,  pinpoint red spots on the skin, black, tarry stools, blood in the urine  signs of decreased red blood cells - unusually weak or tired, fainting spells, lightheadedness  breathing problems  dark urine  dizziness  palpitations  swelling of the ankles, feet, hands  trouble passing urine or change in the amount of urine  weight gain  yellowing of the eyes or skin Side effects that usually do not require medical attention (report to your doctor or health care professional if they continue or are bothersome):  changes in nail or skin color  hair loss  missed menstrual periods  mouth sores  nausea, vomiting This list may not describe all possible side effects. Call your doctor for medical advice about side effects. You may report side effects to FDA at 1-800-FDA-1088. Where should I keep my medicine? This drug is given in a hospital or clinic and will not be stored at home. NOTE: This sheet is a summary. It may not cover all possible information. If you have questions about this  medicine, talk to your doctor, pharmacist, or health care provider.  2020 [employer]/Gold Standard (2012-06-27 16:22:58)  Fluorouracil, 5-FU injection What is this medicine? FLUOROURACIL, 5-FU (flure oh YOOR a sil) is a chemotherapy drug. It slows the growth of cancer cells. This medicine is used to treat many types of cancer like breast cancer, colon or rectal cancer, pancreatic cancer, and stomach cancer. This medicine may be used for other purposes; ask your health care provider or pharmacist if you have questions. COMMON BRAND NAME(S): Adrucil What should I tell my health care provider before I take this medicine? They need to know if you have any of these conditions:  blood disorders  dihydropyrimidine dehydrogenase (DPD) deficiency  infection (especially a virus infection such as chickenpox, cold sores, or herpes)  kidney disease  liver disease  malnourished, poor nutrition  recent or ongoing radiation therapy  an unusual or allergic reaction to fluorouracil, other chemotherapy, other medicines, foods, dyes, or preservatives  pregnant or trying to get pregnant  breast-feeding How should I use this medicine? This drug is given as an infusion or injection into a vein. It is administered in a hospital or clinic by a specially trained health care professional. Talk to your pediatrician regarding the use of this medicine in children. Special care may be needed. Overdosage: If you think you have taken too much of this medicine contact a poison control center or emergency room at once. NOTE: This medicine is only for you. Do not share this medicine with others. What if I miss a dose? It is important not to miss your dose. Call your doctor or health care professional if you are unable to keep an appointment. What may interact with this medicine?  allopurinol  cimetidine  dapsone  digoxin  hydroxyurea  leucovorin  levamisole  medicines for seizures like ethotoin,  fosphenytoin, phenytoin  medicines to increase blood counts like filgrastim, pegfilgrastim, sargramostim  medicines that treat or prevent blood clots like warfarin, enoxaparin, and dalteparin  methotrexate  metronidazole  pyrimethamine  some other chemotherapy drugs like busulfan, cisplatin, estramustine, vinblastine  trimethoprim  trimetrexate  vaccines Talk to your doctor or health care professional before taking any of these medicines:  acetaminophen  aspirin  ibuprofen  ketoprofen  naproxen This list may not describe all possible interactions. Give your health care provider a list of all the medicines, herbs, non-prescription drugs, or dietary supplements you use. Also tell them if you smoke, drink alcohol, or  use illegal drugs. Some items may interact with your medicine. What should I watch for while using this medicine? Visit your doctor for checks on your progress. This drug may make you feel generally unwell. This is not uncommon, as chemotherapy can affect healthy cells as well as cancer cells. Report any side effects. Continue your course of treatment even though you feel ill unless your doctor tells you to stop. In some cases, you may be given additional medicines to help with side effects. Follow all directions for their use. Call your doctor or health care professional for advice if you get a fever, chills or sore throat, or other symptoms of a cold or flu. Do not treat yourself. This drug decreases your body's ability to fight infections. Try to avoid being around people who are sick. This medicine may increase your risk to bruise or bleed. Call your doctor or health care professional if you notice any unusual bleeding. Be careful brushing and flossing your teeth or using a toothpick because you may get an infection or bleed more easily. If you have any dental work done, tell your dentist you are receiving this medicine. Avoid taking products that contain aspirin,  acetaminophen, ibuprofen, naproxen, or ketoprofen unless instructed by your doctor. These medicines may hide a fever. Do not become pregnant while taking this medicine. Women should inform their doctor if they wish to become pregnant or think they might be pregnant. There is a potential for serious side effects to an unborn child. Talk to your health care professional or pharmacist for more information. Do not breast-feed an infant while taking this medicine. Men should inform their doctor if they wish to father a child. This medicine may lower sperm counts. Do not treat diarrhea with over the counter products. Contact your doctor if you have diarrhea that lasts more than 2 days or if it is severe and watery. This medicine can make you more sensitive to the sun. Keep out of the sun. If you cannot avoid being in the sun, wear protective clothing and use sunscreen. Do not use sun lamps or tanning beds/booths. What side effects may I notice from receiving this medicine? Side effects that you should report to your doctor or health care professional as soon as possible:  allergic reactions like skin rash, itching or hives, swelling of the face, lips, or tongue  low blood counts - this medicine may decrease the number of white blood cells, red blood cells and platelets. You may be at increased risk for infections and bleeding.  signs of infection - fever or chills, cough, sore throat, pain or difficulty passing urine  signs of decreased platelets or bleeding - bruising, pinpoint red spots on the skin, black, tarry stools, blood in the urine  signs of decreased red blood cells - unusually weak or tired, fainting spells, lightheadedness  breathing problems  changes in vision  chest pain  mouth sores  nausea and vomiting  pain, swelling, redness at site where injected  pain, tingling, numbness in the hands or feet  redness, swelling, or sores on hands or feet  stomach pain  unusual  bleeding Side effects that usually do not require medical attention (report to your doctor or health care professional if they continue or are bothersome):  changes in finger or toe nails  diarrhea  dry or itchy skin  hair loss  headache  loss of appetite  sensitivity of eyes to the light  stomach upset  unusually teary eyes This list  may not describe all possible side effects. Call your doctor for medical advice about side effects. You may report side effects to FDA at 1-800-FDA-1088. Where should I keep my medicine? This drug is given in a hospital or clinic and will not be stored at home. NOTE: This sheet is a summary. It may not cover all possible information. If you have questions about this medicine, talk to your doctor, pharmacist, or health care provider.  2020 Elsevier/Gold Standard (2007-12-17 13:53:16)

## 2019-06-23 NOTE — Patient Instructions (Signed)

## 2019-06-23 NOTE — Progress Notes (Signed)
Per Dr. Jana Hakim ok to treat today with ANC of 1.4

## 2019-06-24 ENCOUNTER — Other Ambulatory Visit: Payer: Self-pay | Admitting: Oncology

## 2019-06-25 ENCOUNTER — Telehealth: Payer: Self-pay | Admitting: Oncology

## 2019-06-25 ENCOUNTER — Encounter: Payer: Self-pay | Admitting: Sports Medicine

## 2019-06-25 ENCOUNTER — Ambulatory Visit (INDEPENDENT_AMBULATORY_CARE_PROVIDER_SITE_OTHER): Payer: Managed Care, Other (non HMO) | Admitting: Sports Medicine

## 2019-06-25 ENCOUNTER — Other Ambulatory Visit: Payer: Self-pay

## 2019-06-25 VITALS — BP 132/82 | Ht 67.0 in | Wt 155.0 lb

## 2019-06-25 DIAGNOSIS — M25511 Pain in right shoulder: Secondary | ICD-10-CM

## 2019-06-25 NOTE — Telephone Encounter (Signed)
I could not reach patient regarding schedule  °

## 2019-06-25 NOTE — Progress Notes (Signed)
   Subjective:    Patient ID: Allison Hanna, female    DOB: 1961/01/01, 58 y.o.   MRN: [id number]  HPI chief complaint: Right shoulder pain  Very pleasant 58 year old right-hand-dominant female comes in today complaining of right shoulder pain this been present since March or April of this year.  She does not recall any specific injury but does recall a rather sudden onset of pain that she attributed to sleeping on her right side.  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  Recently she returned to tennis and noticed that any sort of overhead activity such as serving seem to reproduce her pain.  Pain is localized to the anterior shoulder.  In addition to overhead reaching she also notices discomfort with internal rotation.  She does mention that the shoulder will occasionally pop and feels better afterwards.  She denies any significant problems with the right shoulder in the past.  She did have some left shoulder pain previously and was given some rotator cuff exercises for that which she has been doing for her right shoulder with some improvement in symptoms.  She continues to endorse nighttime pain.  She takes occasional Tylenol but it does not seem to help.  She has not able to take NSAIDs at this time secondary to her treatment for breast cancer.  She denies numbness or tingling.  Pain does not radiate down her arm.  No prior shoulder surgeries.  Past medical history reviewed.  History is significant for a recent breast cancer diagnosis for which she is currently undergoing chemotherapy Medications reviewed Allergies reviewed   Review of Systems As above    Objective:   Physical Exam  Well-developed, well-nourished.  No acute distress.  She sits comfortably in the exam room.  Right shoulder: Patient demonstrates full active and passive range of motion.  She does have pain at the extreme of forward flexion.  No tenderness over the acromioclavicular joint  nor over the bicipital groove.  Mildly positive empty can, mildly positive Hawkin.  Rotator cuff strength is 5/5 and does not reproduce pain.  Positive O'Brien's.  Negative speeds, negative Yergason's.  Neurovascularly intact distally.      Assessment & Plan:   Right shoulder pain possibly secondary to biceps tendinitis versus labral tear versus rotator cuff impingement  Patient has excellent strength on rotator cuff testing so I do not believe she has a significant rotator cuff tear.  Although she does have a negative speeds and negative Yergason's she may still have proximal biceps tendinitis.  I recommended that she return to the office in a couple of weeks after her return from the beach for a complete right shoulder ultrasound concentrating on biceps tendon, rotator cuff impingement, and her acromioclavicular joint.  In the meantime I think she should continue with her home exercises.  I asked her to demonstrate those for me today in the office and she is doing a complete set of Jobe exercises at home.  We will delineate treatment based on her ultrasound findings.

## 2019-07-08 ENCOUNTER — Ambulatory Visit
Admission: RE | Admit: 2019-07-08 | Discharge: 2019-07-08 | Disposition: A | Discharge status: Home / Self Care | Payer: Managed Care, Other (non HMO) | Source: Ambulatory Visit | Attending: Radiation Oncology | Admitting: Radiation Oncology

## 2019-07-08 ENCOUNTER — Other Ambulatory Visit: Payer: Self-pay

## 2019-07-08 DIAGNOSIS — Z170 Estrogen receptor positive status [ER+]: Secondary | ICD-10-CM | POA: Insufficient documentation

## 2019-07-08 DIAGNOSIS — Z510 Encounter for antineoplastic radiation therapy: Secondary | ICD-10-CM | POA: Diagnosis present

## 2019-07-08 DIAGNOSIS — C50512 Malignant neoplasm of lower-outer quadrant of left female breast: Secondary | ICD-10-CM | POA: Diagnosis not present

## 2019-07-09 ENCOUNTER — Ambulatory Visit: Payer: Self-pay

## 2019-07-09 ENCOUNTER — Encounter: Payer: Self-pay | Admitting: Sports Medicine

## 2019-07-09 ENCOUNTER — Ambulatory Visit (INDEPENDENT_AMBULATORY_CARE_PROVIDER_SITE_OTHER)
Admission: RE | Admit: 2019-07-09 | Discharge: 2019-07-09 | Disposition: A | Discharge status: Home / Self Care | Payer: Self-pay | Source: Ambulatory Visit | Attending: Cardiology | Admitting: Cardiology

## 2019-07-09 ENCOUNTER — Ambulatory Visit (INDEPENDENT_AMBULATORY_CARE_PROVIDER_SITE_OTHER): Payer: Managed Care, Other (non HMO) | Admitting: Sports Medicine

## 2019-07-09 ENCOUNTER — Ambulatory Visit: Payer: Managed Care, Other (non HMO) | Admitting: Orthotics

## 2019-07-09 VITALS — BP 136/80 | Ht 67.0 in | Wt 155.0 lb

## 2019-07-09 DIAGNOSIS — M2041 Other hammer toe(s) (acquired), right foot: Secondary | ICD-10-CM

## 2019-07-09 DIAGNOSIS — C50512 Malignant neoplasm of lower-outer quadrant of left female breast: Secondary | ICD-10-CM

## 2019-07-09 DIAGNOSIS — Z170 Estrogen receptor positive status [ER+]: Secondary | ICD-10-CM

## 2019-07-09 DIAGNOSIS — M2141 Flat foot [pes planus] (acquired), right foot: Secondary | ICD-10-CM

## 2019-07-09 DIAGNOSIS — M79671 Pain in right foot: Secondary | ICD-10-CM

## 2019-07-09 DIAGNOSIS — M2042 Other hammer toe(s) (acquired), left foot: Secondary | ICD-10-CM

## 2019-07-09 DIAGNOSIS — M25511 Pain in right shoulder: Secondary | ICD-10-CM

## 2019-07-09 MED ORDER — NITROGLYCERIN 0.2 MG/HR TD PT24: MEDICATED_PATCH | TRANSDERMAL | 1 refills | Status: DC

## 2019-07-09 NOTE — Progress Notes (Signed)
Patient ID: Allison Hanna, female   DOB: 22-Oct-1960, 58 y.o.   MRN: [id number]  Patient comes in today for a right shoulder ultrasound.  Please see the office note from her previous visit for details regarding history and physical exam findings  Complete right shoulder ultrasound: Biceps tendon well visualized in long and short axis.  There is a bifid proximal biceps tendon but no tear.  No fluid surrounding the biceps tendon.  Subscapularis was well visualized without abnormality.  Supraspinatus shows a small hypoechoic area within the substance of the tendon on both short and long axis views.  Acromioclavicular joint shows mild degenerative changes but no mushroom sign.  Infraspinatus is well visualized without abnormality.  Glenohumeral joint unremarkable. Findings are consistent with small intrasubstance supraspinatus tear and mild acromioclavicular joint DJD.  Based on these findings I recommended a trial of topical nitroglycerin coupled with a Jobe home exercise program and scapular stabilization exercises.  I recommended against tennis for the time being.  I have also asked her to ergonomically evaluate her workspace as I think her arm positioning when using her computer mouse with her right hand may be a little too high.  Follow-up with me again in 6 weeks for reevaluation.  If symptoms persist I would consider merits of further diagnostic imaging.  Of note, patient was also complaining of some medial sided knee pain which has been present for couple of weeks.  Started after she was pulled by her dog.  No mechanical symptoms.  No swelling.  I recommended a simple isometric quad exercise and if this worsens then she will return to the office for a more formal evaluation.

## 2019-07-09 NOTE — Progress Notes (Signed)
Rescanned, now put into production

## 2019-07-09 NOTE — Patient Instructions (Signed)
Nitroglycerin Protocol   Apply 1/4 nitroglycerin patch to affected area daily.  Change position of patch within the affected area every 24 hours.  You may experience a headache during the first 1-2 weeks of using the patch, these should subside.  If you experience headaches after beginning nitroglycerin patch treatment, you may take your preferred over the counter pain reliever.  Another side effect of the nitroglycerin patch is skin irritation or rash related to patch adhesive.  Please notify our office if you develop more severe headaches or rash, and stop the patch.  Tendon healing with nitroglycerin patch may require 12 to 24 weeks depending on the extent of injury.  Men should not use if taking Viagra, Cialis, or Levitra.   Do not use if you have migraines or rosacea.    Follow up with [place] in 6 weeks.

## 2019-07-14 ENCOUNTER — Inpatient Hospital Stay: Payer: Managed Care, Other (non HMO)

## 2019-07-14 ENCOUNTER — Inpatient Hospital Stay: Payer: Managed Care, Other (non HMO) | Attending: Oncology

## 2019-07-14 ENCOUNTER — Other Ambulatory Visit: Payer: Self-pay

## 2019-07-14 VITALS — BP 147/88 | HR 65 | Temp 97.8°F | Resp 18

## 2019-07-14 DIAGNOSIS — C50512 Malignant neoplasm of lower-outer quadrant of left female breast: Secondary | ICD-10-CM | POA: Diagnosis present

## 2019-07-14 DIAGNOSIS — Z510 Encounter for antineoplastic radiation therapy: Secondary | ICD-10-CM | POA: Diagnosis not present

## 2019-07-14 DIAGNOSIS — Z170 Estrogen receptor positive status [ER+]: Secondary | ICD-10-CM | POA: Insufficient documentation

## 2019-07-14 DIAGNOSIS — Z95828 Presence of other vascular implants and grafts: Secondary | ICD-10-CM

## 2019-07-14 DIAGNOSIS — Z79899 Other long term (current) drug therapy: Secondary | ICD-10-CM | POA: Insufficient documentation

## 2019-07-14 DIAGNOSIS — Z5112 Encounter for antineoplastic immunotherapy: Secondary | ICD-10-CM | POA: Diagnosis present

## 2019-07-14 LAB — CBC WITH DIFFERENTIAL/PLATELET
Abs Immature Granulocytes: 0.02 10*3/uL (ref 0.00–0.07)
Basophils Absolute: 0 10*3/uL (ref 0.0–0.1)
Basophils Relative: 2 %
Eosinophils Absolute: 0.1 10*3/uL (ref 0.0–0.5)
Eosinophils Relative: 3 %
HCT: 31.5 % — ABNORMAL LOW (ref 36.0–46.0)
Hemoglobin: 10.6 g/dL — ABNORMAL LOW (ref 12.0–15.0)
Immature Granulocytes: 1 %
Lymphocytes Relative: 34 %
Lymphs Abs: 0.7 10*3/uL (ref 0.7–4.0)
MCH: 32 pg (ref 26.0–34.0)
MCHC: 33.7 g/dL (ref 30.0–36.0)
MCV: 95.2 fL (ref 80.0–100.0)
Monocytes Absolute: 0.5 10*3/uL (ref 0.1–1.0)
Monocytes Relative: 23 %
Neutro Abs: 0.8 10*3/uL — ABNORMAL LOW (ref 1.7–7.7)
Neutrophils Relative %: 37 %
Platelets: 228 10*3/uL (ref 150–400)
RBC: 3.31 MIL/uL — ABNORMAL LOW (ref 3.87–5.11)
RDW: 13.2 % (ref 11.5–15.5)
WBC: 2.2 10*3/uL — ABNORMAL LOW (ref 4.0–10.5)
nRBC: 0 % (ref 0.0–0.2)

## 2019-07-14 LAB — COMPREHENSIVE METABOLIC PANEL
ALT: 19 U/L (ref 0–44)
AST: 17 U/L (ref 15–41)
Albumin: 3.8 g/dL (ref 3.5–5.0)
Alkaline Phosphatase: 44 U/L (ref 38–126)
Anion gap: 10 (ref 5–15)
BUN: 25 mg/dL — ABNORMAL HIGH (ref 6–20)
CO2: 24 mmol/L (ref 22–32)
Calcium: 8.8 mg/dL — ABNORMAL LOW (ref 8.9–10.3)
Chloride: 99 mmol/L (ref 98–111)
Creatinine, Ser: 0.79 mg/dL (ref 0.44–1.00)
GFR calc Af Amer: >60 mL/min (ref >60)
GFR calc non Af Amer: >60 mL/min (ref >60)
Glucose, Bld: 102 mg/dL — ABNORMAL HIGH (ref 70–99)
Potassium: 4.3 mmol/L (ref 3.5–5.1)
Sodium: 133 mmol/L — ABNORMAL LOW (ref 135–145)
Total Bilirubin: <0.2 mg/dL — ABNORMAL LOW (ref 0.3–1.2)
Total Protein: 6.8 g/dL (ref 6.5–8.1)

## 2019-07-14 MED ORDER — SODIUM CHLORIDE 0.9 % IV SOLN
Freq: Once | INTRAVENOUS | Status: AC
  Administered 2019-07-14: 14:00:00 via INTRAVENOUS
  Filled 2019-07-14: qty 250

## 2019-07-14 MED ORDER — ACETAMINOPHEN 325 MG PO TABS
650.0000 mg | ORAL_TABLET | Freq: Once | ORAL | Status: AC
  Administered 2019-07-14: 650 mg via ORAL

## 2019-07-14 MED ORDER — DIPHENHYDRAMINE HCL 25 MG PO CAPS
25.0000 mg | ORAL_CAPSULE | Freq: Once | ORAL | Status: AC
  Administered 2019-07-14: 25 mg via ORAL

## 2019-07-14 MED ORDER — SODIUM CHLORIDE 0.9% FLUSH
10.0000 mL | INTRAVENOUS | Status: DC | PRN
  Administered 2019-07-14: 13:00:00 10 mL
  Filled 2019-07-14: qty 10

## 2019-07-14 MED ORDER — HEPARIN SOD (PORK) LOCK FLUSH 100 UNIT/ML IV SOLN
500.0000 [IU] | Freq: Once | INTRAVENOUS | Status: AC | PRN
  Administered 2019-07-14: 500 [IU]
  Filled 2019-07-14: qty 5

## 2019-07-14 MED ORDER — ACETAMINOPHEN 325 MG PO TABS
ORAL_TABLET | ORAL | Status: AC
  Filled 2019-07-14: qty 2

## 2019-07-14 MED ORDER — SODIUM CHLORIDE 0.9% FLUSH
10.0000 mL | INTRAVENOUS | Status: DC | PRN
  Administered 2019-07-14: 10 mL
  Filled 2019-07-14: qty 10

## 2019-07-14 MED ORDER — TRASTUZUMAB-DKST CHEMO 150 MG IV SOLR
450.0000 mg | Freq: Once | INTRAVENOUS | Status: AC
  Administered 2019-07-14: 450 mg via INTRAVENOUS
  Filled 2019-07-14: qty 21.43

## 2019-07-14 MED ORDER — DIPHENHYDRAMINE HCL 25 MG PO CAPS
ORAL_CAPSULE | ORAL | Status: AC
  Filled 2019-07-14: qty 1

## 2019-07-14 NOTE — Patient Instructions (Signed)
[facility] Discharge Instructions for Patients Receiving Chemotherapy  Today you received the following chemotherapy agents Trastuzumab (OGIVRI).  To help prevent nausea and vomiting after your treatment, we encourage you to take your nausea medication as prescribed.   If you develop nausea and vomiting that is not controlled by your nausea medication, call the clinic.   BELOW ARE SYMPTOMS THAT SHOULD BE REPORTED IMMEDIATELY:  *FEVER GREATER THAN 100.5 F  *CHILLS WITH OR WITHOUT FEVER  NAUSEA AND VOMITING THAT IS NOT CONTROLLED WITH YOUR NAUSEA MEDICATION  *UNUSUAL SHORTNESS OF BREATH  *UNUSUAL BRUISING OR BLEEDING  TENDERNESS IN MOUTH AND THROAT WITH OR WITHOUT PRESENCE OF ULCERS  *URINARY PROBLEMS  *BOWEL PROBLEMS  UNUSUAL RASH Items with * indicate a potential emergency and should be followed up as soon as possible.  Feel free to call the clinic should you have any questions or concerns. The clinic phone number is [phone].  Please show the CHEMO ALERT CARD at check-in to the Emergency Department and triage nurse.  Coronavirus (COVID-19) Are you at risk?  Are you at risk for the Coronavirus (COVID-19)?  To be considered HIGH RISK for Coronavirus (COVID-19), you have to meet the following criteria:  . Traveled to [place], [place], [place], [place] or [place]; or in the [place] to [place], [place], [place], or [place]; and have fever, cough, and shortness of breath within the last 2 weeks of travel OR . Been in close contact with a person diagnosed with COVID-19 within the last 2 weeks and have fever, cough, and shortness of breath . IF YOU DO NOT MEET THESE CRITERIA, YOU ARE CONSIDERED LOW RISK FOR COVID-19.  What to do if you are HIGH RISK for COVID-19?  . If you are having a medical emergency, call 911. . Seek medical care right away. Before you go to a doctor's office, urgent care or emergency department, call ahead and tell them  about your recent travel, contact with someone diagnosed with COVID-19, and your symptoms. You should receive instructions from your physician's office regarding next steps of care.  . When you arrive at healthcare provider, tell the healthcare staff immediately you have returned from visiting [place], [place], [place], [place] or [place]; or traveled in the [place] to [place], [place], [place], or [place]; in the last two weeks or you have been in close contact with a person diagnosed with COVID-19 in the last 2 weeks.   . Tell the health care staff about your symptoms: fever, cough and shortness of breath. . After you have been seen by a medical provider, you will be either: o Tested for (COVID-19) and discharged home on quarantine except to seek medical care if symptoms worsen, and asked to  - Stay home and avoid contact with others until you get your results (4-5 days)  - Avoid travel on public transportation if possible (such as bus, train, or airplane) or o Sent to the Emergency Department by EMS for evaluation, COVID-19 testing, and possible admission depending on your condition and test results.  What to do if you are LOW RISK for COVID-19?  Reduce your risk of any infection by using the same precautions used for avoiding the common cold or flu:  . Wash your hands often with soap and warm water for at least 20 seconds.  If soap and water are not readily available, use an alcohol-based hand sanitizer with at least 60% alcohol.  . If coughing or   sneezing, cover your mouth and nose by coughing or sneezing into the elbow areas of your shirt or coat, into a tissue or into your sleeve (not your hands). . Avoid shaking hands with others and consider head nods or verbal greetings only. . Avoid touching your eyes, nose, or mouth with unwashed hands.  . Avoid close contact with people who are sick. . Avoid places or events with large numbers of people in one location, like concerts or  sporting events. . Carefully consider travel plans you have or are making. . If you are planning any travel outside or inside the [place], visit the CDC's Travelers' Health webpage for the latest health notices. . If you have some symptoms but not all symptoms, continue to monitor at home and seek medical attention if your symptoms worsen. . If you are having a medical emergency, call 911.   [place] / e-Visit: [email or web address]         [facility]: [facility]: [phone]                   [facility]: [phone]

## 2019-07-15 ENCOUNTER — Ambulatory Visit
Admission: RE | Admit: 2019-07-15 | Discharge: 2019-07-15 | Disposition: A | Discharge status: Home / Self Care | Payer: Managed Care, Other (non HMO) | Source: Ambulatory Visit | Attending: Radiation Oncology | Admitting: Radiation Oncology

## 2019-07-15 ENCOUNTER — Other Ambulatory Visit: Payer: Self-pay

## 2019-07-15 DIAGNOSIS — Z170 Estrogen receptor positive status [ER+]: Secondary | ICD-10-CM

## 2019-07-15 DIAGNOSIS — C50512 Malignant neoplasm of lower-outer quadrant of left female breast: Secondary | ICD-10-CM

## 2019-07-15 DIAGNOSIS — Z510 Encounter for antineoplastic radiation therapy: Secondary | ICD-10-CM | POA: Diagnosis not present

## 2019-07-15 NOTE — Progress Notes (Signed)
  Radiation Oncology         [phone] ________________________________  Name: Allison Hanna MRN: [id number]  Date: 07/15/2019  DOB: 03/02/1961  Simulation Verification Note    ICD-10-CM   1. Malignant neoplasm of lower-outer quadrant of left breast of female, estrogen receptor positive ([place])  C50.512    Z17.0     Status: outpatient  NARRATIVE: The patient was brought to the treatment unit and placed in the planned treatment position. The clinical setup was verified. Then port films were obtained and uploaded to the radiation oncology medical record software.  The treatment beams were carefully compared against the planned radiation fields. The position location and shape of the radiation fields was reviewed. They targeted volume of tissue appears to be appropriately covered by the radiation beams. Organs at risk appear to be excluded as planned.  Based on my personal review, I approved the simulation verification. The patient's treatment will proceed as planned.  -----------------------------------  Blair Promise, PhD, MD

## 2019-07-16 ENCOUNTER — Other Ambulatory Visit: Payer: Self-pay

## 2019-07-16 ENCOUNTER — Ambulatory Visit
Admission: RE | Admit: 2019-07-16 | Discharge: 2019-07-16 | Disposition: A | Discharge status: Home / Self Care | Payer: Managed Care, Other (non HMO) | Source: Ambulatory Visit | Attending: Radiation Oncology | Admitting: Radiation Oncology

## 2019-07-16 ENCOUNTER — Encounter: Payer: Self-pay | Admitting: *Deleted

## 2019-07-16 DIAGNOSIS — Z510 Encounter for antineoplastic radiation therapy: Secondary | ICD-10-CM | POA: Diagnosis not present

## 2019-07-17 ENCOUNTER — Ambulatory Visit
Admission: RE | Admit: 2019-07-17 | Discharge: 2019-07-17 | Disposition: A | Discharge status: Home / Self Care | Payer: Managed Care, Other (non HMO) | Source: Ambulatory Visit | Attending: Radiation Oncology | Admitting: Radiation Oncology

## 2019-07-17 ENCOUNTER — Other Ambulatory Visit: Payer: Self-pay

## 2019-07-17 DIAGNOSIS — Z510 Encounter for antineoplastic radiation therapy: Secondary | ICD-10-CM | POA: Diagnosis not present

## 2019-07-19 ENCOUNTER — Ambulatory Visit
Admission: RE | Admit: 2019-07-19 | Discharge: 2019-07-19 | Disposition: A | Discharge status: Home / Self Care | Payer: Managed Care, Other (non HMO) | Source: Ambulatory Visit | Attending: Radiation Oncology | Admitting: Radiation Oncology

## 2019-07-19 ENCOUNTER — Other Ambulatory Visit: Payer: Self-pay

## 2019-07-19 DIAGNOSIS — Z510 Encounter for antineoplastic radiation therapy: Secondary | ICD-10-CM | POA: Diagnosis not present

## 2019-07-20 ENCOUNTER — Ambulatory Visit
Admission: RE | Admit: 2019-07-20 | Discharge: 2019-07-20 | Disposition: A | Discharge status: Home / Self Care | Payer: Managed Care, Other (non HMO) | Source: Ambulatory Visit | Attending: Radiation Oncology | Admitting: Radiation Oncology

## 2019-07-20 ENCOUNTER — Other Ambulatory Visit: Payer: Self-pay

## 2019-07-20 DIAGNOSIS — Z510 Encounter for antineoplastic radiation therapy: Secondary | ICD-10-CM | POA: Diagnosis not present

## 2019-07-21 ENCOUNTER — Other Ambulatory Visit: Payer: Self-pay

## 2019-07-21 ENCOUNTER — Ambulatory Visit
Admission: RE | Admit: 2019-07-21 | Discharge: 2019-07-21 | Disposition: A | Discharge status: Home / Self Care | Payer: Managed Care, Other (non HMO) | Source: Ambulatory Visit | Attending: Radiation Oncology | Admitting: Radiation Oncology

## 2019-07-21 DIAGNOSIS — Z510 Encounter for antineoplastic radiation therapy: Secondary | ICD-10-CM | POA: Diagnosis not present

## 2019-07-21 DIAGNOSIS — C50512 Malignant neoplasm of lower-outer quadrant of left female breast: Secondary | ICD-10-CM

## 2019-07-21 MED ORDER — SONAFINE EX EMUL
1.0000 "application " | Freq: Two times a day (BID) | CUTANEOUS | Status: DC
  Administered 2019-07-21: 1 via TOPICAL

## 2019-07-21 MED ORDER — ALRA NON-METALLIC DEODORANT (RAD-ONC)
1.0000 "application " | Freq: Once | TOPICAL | Status: AC
  Administered 2019-07-21: 1 via TOPICAL

## 2019-07-22 ENCOUNTER — Ambulatory Visit
Admission: RE | Admit: 2019-07-22 | Discharge: 2019-07-22 | Disposition: A | Discharge status: Home / Self Care | Payer: Managed Care, Other (non HMO) | Source: Ambulatory Visit | Attending: Radiation Oncology | Admitting: Radiation Oncology

## 2019-07-22 ENCOUNTER — Other Ambulatory Visit: Payer: Self-pay

## 2019-07-22 DIAGNOSIS — Z510 Encounter for antineoplastic radiation therapy: Secondary | ICD-10-CM | POA: Diagnosis not present

## 2019-07-27 ENCOUNTER — Ambulatory Visit
Admission: RE | Admit: 2019-07-27 | Discharge: 2019-07-27 | Disposition: A | Discharge status: Home / Self Care | Payer: Managed Care, Other (non HMO) | Source: Ambulatory Visit | Attending: Radiation Oncology | Admitting: Radiation Oncology

## 2019-07-27 ENCOUNTER — Other Ambulatory Visit: Payer: Self-pay

## 2019-07-27 DIAGNOSIS — Z510 Encounter for antineoplastic radiation therapy: Secondary | ICD-10-CM | POA: Diagnosis not present

## 2019-07-28 ENCOUNTER — Other Ambulatory Visit: Payer: Self-pay

## 2019-07-28 ENCOUNTER — Ambulatory Visit
Admission: RE | Admit: 2019-07-28 | Discharge: 2019-07-28 | Disposition: A | Discharge status: Home / Self Care | Payer: Managed Care, Other (non HMO) | Source: Ambulatory Visit | Attending: Radiation Oncology | Admitting: Radiation Oncology

## 2019-07-28 DIAGNOSIS — Z170 Estrogen receptor positive status [ER+]: Secondary | ICD-10-CM | POA: Insufficient documentation

## 2019-07-28 DIAGNOSIS — C50512 Malignant neoplasm of lower-outer quadrant of left female breast: Secondary | ICD-10-CM | POA: Insufficient documentation

## 2019-07-28 DIAGNOSIS — Z79899 Other long term (current) drug therapy: Secondary | ICD-10-CM | POA: Diagnosis not present

## 2019-07-28 DIAGNOSIS — Z23 Encounter for immunization: Secondary | ICD-10-CM | POA: Diagnosis not present

## 2019-07-28 DIAGNOSIS — Z510 Encounter for antineoplastic radiation therapy: Secondary | ICD-10-CM | POA: Insufficient documentation

## 2019-07-28 DIAGNOSIS — Z5112 Encounter for antineoplastic immunotherapy: Secondary | ICD-10-CM | POA: Diagnosis present

## 2019-07-29 ENCOUNTER — Other Ambulatory Visit: Payer: Self-pay

## 2019-07-29 ENCOUNTER — Ambulatory Visit
Admission: RE | Admit: 2019-07-29 | Discharge: 2019-07-29 | Disposition: A | Discharge status: Home / Self Care | Payer: Managed Care, Other (non HMO) | Source: Ambulatory Visit | Attending: Radiation Oncology | Admitting: Radiation Oncology

## 2019-07-29 DIAGNOSIS — Z5112 Encounter for antineoplastic immunotherapy: Secondary | ICD-10-CM | POA: Diagnosis not present

## 2019-07-30 ENCOUNTER — Other Ambulatory Visit: Payer: Self-pay

## 2019-07-30 ENCOUNTER — Ambulatory Visit
Admission: RE | Admit: 2019-07-30 | Discharge: 2019-07-30 | Disposition: A | Discharge status: Home / Self Care | Payer: Managed Care, Other (non HMO) | Source: Ambulatory Visit | Attending: Radiation Oncology | Admitting: Radiation Oncology

## 2019-07-30 DIAGNOSIS — Z5112 Encounter for antineoplastic immunotherapy: Secondary | ICD-10-CM | POA: Diagnosis not present

## 2019-07-31 ENCOUNTER — Ambulatory Visit
Admission: RE | Admit: 2019-07-31 | Discharge: 2019-07-31 | Disposition: A | Discharge status: Home / Self Care | Payer: Managed Care, Other (non HMO) | Source: Ambulatory Visit | Attending: Radiation Oncology | Admitting: Radiation Oncology

## 2019-07-31 ENCOUNTER — Other Ambulatory Visit: Payer: Self-pay

## 2019-07-31 DIAGNOSIS — Z5112 Encounter for antineoplastic immunotherapy: Secondary | ICD-10-CM | POA: Diagnosis not present

## 2019-08-03 ENCOUNTER — Ambulatory Visit
Admission: RE | Admit: 2019-08-03 | Discharge: 2019-08-03 | Disposition: A | Discharge status: Home / Self Care | Payer: Managed Care, Other (non HMO) | Source: Ambulatory Visit | Attending: Radiation Oncology | Admitting: Radiation Oncology

## 2019-08-03 ENCOUNTER — Other Ambulatory Visit: Payer: Self-pay

## 2019-08-03 DIAGNOSIS — Z5112 Encounter for antineoplastic immunotherapy: Secondary | ICD-10-CM | POA: Diagnosis not present

## 2019-08-04 ENCOUNTER — Inpatient Hospital Stay: Payer: Managed Care, Other (non HMO) | Attending: Oncology

## 2019-08-04 ENCOUNTER — Inpatient Hospital Stay: Payer: Managed Care, Other (non HMO)

## 2019-08-04 ENCOUNTER — Other Ambulatory Visit: Payer: Self-pay

## 2019-08-04 ENCOUNTER — Ambulatory Visit
Admission: RE | Admit: 2019-08-04 | Discharge: 2019-08-04 | Disposition: A | Discharge status: Home / Self Care | Payer: Managed Care, Other (non HMO) | Source: Ambulatory Visit | Attending: Radiation Oncology | Admitting: Radiation Oncology

## 2019-08-04 ENCOUNTER — Other Ambulatory Visit (HOSPITAL_COMMUNITY): Payer: Self-pay

## 2019-08-04 VITALS — BP 147/81 | HR 63 | Temp 98.9°F | Resp 17 | Wt 159.8 lb

## 2019-08-04 DIAGNOSIS — Z5112 Encounter for antineoplastic immunotherapy: Secondary | ICD-10-CM | POA: Insufficient documentation

## 2019-08-04 DIAGNOSIS — C50512 Malignant neoplasm of lower-outer quadrant of left female breast: Secondary | ICD-10-CM | POA: Insufficient documentation

## 2019-08-04 DIAGNOSIS — Z170 Estrogen receptor positive status [ER+]: Secondary | ICD-10-CM | POA: Insufficient documentation

## 2019-08-04 DIAGNOSIS — Z79899 Other long term (current) drug therapy: Secondary | ICD-10-CM | POA: Insufficient documentation

## 2019-08-04 DIAGNOSIS — Z23 Encounter for immunization: Secondary | ICD-10-CM

## 2019-08-04 DIAGNOSIS — Z95828 Presence of other vascular implants and grafts: Secondary | ICD-10-CM

## 2019-08-04 DIAGNOSIS — Z510 Encounter for antineoplastic radiation therapy: Secondary | ICD-10-CM | POA: Insufficient documentation

## 2019-08-04 LAB — COMPREHENSIVE METABOLIC PANEL
ALT: 15 U/L (ref 0–44)
AST: 17 U/L (ref 15–41)
Albumin: 3.9 g/dL (ref 3.5–5.0)
Alkaline Phosphatase: 40 U/L (ref 38–126)
Anion gap: 8 (ref 5–15)
BUN: 17 mg/dL (ref 6–20)
CO2: 27 mmol/L (ref 22–32)
Calcium: 8.6 mg/dL — ABNORMAL LOW (ref 8.9–10.3)
Chloride: 98 mmol/L (ref 98–111)
Creatinine, Ser: 0.82 mg/dL (ref 0.44–1.00)
GFR calc Af Amer: >60 mL/min (ref >60)
GFR calc non Af Amer: >60 mL/min (ref >60)
Glucose, Bld: 95 mg/dL (ref 70–99)
Potassium: 4.2 mmol/L (ref 3.5–5.1)
Sodium: 133 mmol/L — ABNORMAL LOW (ref 135–145)
Total Bilirubin: 0.4 mg/dL (ref 0.3–1.2)
Total Protein: 6.9 g/dL (ref 6.5–8.1)

## 2019-08-04 LAB — CBC WITH DIFFERENTIAL/PLATELET
Abs Immature Granulocytes: 0.02 10*3/uL (ref 0.00–0.07)
Basophils Absolute: 0.1 10*3/uL (ref 0.0–0.1)
Basophils Relative: 2 %
Eosinophils Absolute: 0.1 10*3/uL (ref 0.0–0.5)
Eosinophils Relative: 2 %
HCT: 33.1 % — ABNORMAL LOW (ref 36.0–46.0)
Hemoglobin: 11.2 g/dL — ABNORMAL LOW (ref 12.0–15.0)
Immature Granulocytes: 0 %
Lymphocytes Relative: 11 %
Lymphs Abs: 0.5 10*3/uL — ABNORMAL LOW (ref 0.7–4.0)
MCH: 32.1 pg (ref 26.0–34.0)
MCHC: 33.8 g/dL (ref 30.0–36.0)
MCV: 94.8 fL (ref 80.0–100.0)
Monocytes Absolute: 0.5 10*3/uL (ref 0.1–1.0)
Monocytes Relative: 9 %
Neutro Abs: 3.7 10*3/uL (ref 1.7–7.7)
Neutrophils Relative %: 76 %
Platelets: 269 10*3/uL (ref 150–400)
RBC: 3.49 MIL/uL — ABNORMAL LOW (ref 3.87–5.11)
RDW: 12.4 % (ref 11.5–15.5)
WBC: 4.8 10*3/uL (ref 4.0–10.5)
nRBC: 0 % (ref 0.0–0.2)

## 2019-08-04 MED ORDER — SODIUM CHLORIDE 0.9 % IV SOLN
Freq: Once | INTRAVENOUS | Status: AC
  Administered 2019-08-04: 14:00:00 via INTRAVENOUS
  Filled 2019-08-04: qty 250

## 2019-08-04 MED ORDER — TRASTUZUMAB-DKST CHEMO 150 MG IV SOLR
450.0000 mg | Freq: Once | INTRAVENOUS | Status: AC
  Administered 2019-08-04: 450 mg via INTRAVENOUS
  Filled 2019-08-04: qty 21.43

## 2019-08-04 MED ORDER — DIPHENHYDRAMINE HCL 25 MG PO CAPS
25.0000 mg | ORAL_CAPSULE | Freq: Once | ORAL | Status: AC
  Administered 2019-08-04: 14:00:00 25 mg via ORAL

## 2019-08-04 MED ORDER — INFLUENZA VAC SPLIT QUAD 0.5 ML IM SUSY
PREFILLED_SYRINGE | INTRAMUSCULAR | Status: AC
  Filled 2019-08-04: qty 0.5

## 2019-08-04 MED ORDER — HEPARIN SOD (PORK) LOCK FLUSH 100 UNIT/ML IV SOLN
500.0000 [IU] | Freq: Once | INTRAVENOUS | Status: AC | PRN
  Administered 2019-08-04: 500 [IU]
  Filled 2019-08-04: qty 5

## 2019-08-04 MED ORDER — INFLUENZA VAC SPLIT QUAD 0.5 ML IM SUSY
0.5000 mL | PREFILLED_SYRINGE | Freq: Once | INTRAMUSCULAR | Status: AC
  Administered 2019-08-04: 0.5 mL via INTRAMUSCULAR

## 2019-08-04 MED ORDER — DIPHENHYDRAMINE HCL 25 MG PO CAPS
ORAL_CAPSULE | ORAL | Status: AC
  Filled 2019-08-04: qty 1

## 2019-08-04 MED ORDER — SODIUM CHLORIDE 0.9% FLUSH
10.0000 mL | INTRAVENOUS | Status: DC | PRN
  Administered 2019-08-04: 10 mL
  Filled 2019-08-04: qty 10

## 2019-08-04 MED ORDER — ACETAMINOPHEN 325 MG PO TABS
ORAL_TABLET | ORAL | Status: AC
  Filled 2019-08-04: qty 2

## 2019-08-04 MED ORDER — ACETAMINOPHEN 325 MG PO TABS
650.0000 mg | ORAL_TABLET | Freq: Once | ORAL | Status: AC
  Administered 2019-08-04: 650 mg via ORAL

## 2019-08-04 NOTE — Patient Instructions (Addendum)
[facility] Discharge Instructions for Patients Receiving Chemotherapy  Today you received the following chemotherapy agents Trastuzumab (OGIVRI).  To help prevent nausea and vomiting after your treatment, we encourage you to take your nausea medication as prescribed.   If you develop nausea and vomiting that is not controlled by your nausea medication, call the clinic.   BELOW ARE SYMPTOMS THAT SHOULD BE REPORTED IMMEDIATELY:  *FEVER GREATER THAN 100.5 F  *CHILLS WITH OR WITHOUT FEVER  NAUSEA AND VOMITING THAT IS NOT CONTROLLED WITH YOUR NAUSEA MEDICATION  *UNUSUAL SHORTNESS OF BREATH  *UNUSUAL BRUISING OR BLEEDING  TENDERNESS IN MOUTH AND THROAT WITH OR WITHOUT PRESENCE OF ULCERS  *URINARY PROBLEMS  *BOWEL PROBLEMS  UNUSUAL RASH Items with * indicate a potential emergency and should be followed up as soon as possible.  Feel free to call the clinic should you have any questions or concerns. The clinic phone number is [phone].  Please show the [place] at check-in to the Emergency Department and triage nurse.  Influenza Virus Vaccine (Flucelvax) What is this medicine? INFLUENZA VIRUS VACCINE (in floo EN zuh VAHY ruhs vak SEEN) helps to reduce the risk of getting influenza also known as the flu. The vaccine only helps protect you against some strains of the flu. This medicine may be used for other purposes; ask your health care provider or pharmacist if you have questions. COMMON BRAND NAME(S): FLUCELVAX What should I tell my health care provider before I take this medicine? They need to know if you have any of these conditions:  bleeding disorder like hemophilia  fever or infection  Guillain-Barre syndrome or other neurological problems  immune system problems  infection with the human immunodeficiency virus (HIV) or AIDS  low blood platelet counts  multiple sclerosis  an unusual or allergic reaction to influenza virus vaccine,  other medicines, foods, dyes or preservatives  pregnant or trying to get pregnant  breast-feeding How should I use this medicine? This vaccine is for injection into a muscle. It is given by a health care professional. A copy of Vaccine Information Statements will be given before each vaccination. Read this sheet carefully each time. The sheet may change frequently. Talk to your pediatrician regarding the use of this medicine in children. Special care may be needed. Overdosage: If you think you've taken too much of this medicine contact a poison control center or emergency room at once. Overdosage: If you think you have taken too much of this medicine contact a poison control center or emergency room at once. NOTE: This medicine is only for you. Do not share this medicine with others. What if I miss a dose? This does not apply. What may interact with this medicine?  chemotherapy or radiation therapy  medicines that lower your immune system like etanercept, anakinra, infliximab, and adalimumab  medicines that treat or prevent blood clots like warfarin  phenytoin  steroid medicines like prednisone or cortisone  theophylline  vaccines This list may not describe all possible interactions. Give your health care provider a list of all the medicines, herbs, non-prescription drugs, or dietary supplements you use. Also tell them if you smoke, drink alcohol, or use illegal drugs. Some items may interact with your medicine. What should I watch for while using this medicine? Report any side effects that do not go away within 3 days to your doctor or health care professional. Call your health care provider if any unusual symptoms occur within 6 weeks of receiving this vaccine.  You may still catch the flu, but the illness is not usually as bad. You cannot get the flu from the vaccine. The vaccine will not protect against colds or other illnesses that may cause fever. The vaccine is needed every  year. What side effects may I notice from receiving this medicine? Side effects that you should report to your doctor or health care professional as soon as possible:  allergic reactions like skin rash, itching or hives, swelling of the face, lips, or tongue Side effects that usually do not require medical attention (Report these to your doctor or health care professional if they continue or are bothersome.):  fever  headache  muscle aches and pains  pain, tenderness, redness, or swelling at the injection site  tiredness This list may not describe all possible side effects. Call your doctor for medical advice about side effects. You may report side effects to FDA at 1-800-FDA-1088. Where should I keep my medicine? The vaccine will be given by a health care professional in a clinic, pharmacy, doctor's office, or other health care setting. You will not be given vaccine doses to store at home. NOTE: This sheet is a summary. It may not cover all possible information. If you have questions about this medicine, talk to your doctor, pharmacist, or health care provider.  2020 Elsevier/Gold Standard (2011-07-25 14:06:47)

## 2019-08-04 NOTE — Patient Instructions (Signed)

## 2019-08-05 ENCOUNTER — Other Ambulatory Visit: Payer: Self-pay

## 2019-08-05 ENCOUNTER — Ambulatory Visit
Admission: RE | Admit: 2019-08-05 | Discharge: 2019-08-05 | Disposition: A | Discharge status: Home / Self Care | Payer: Managed Care, Other (non HMO) | Source: Ambulatory Visit | Attending: Radiation Oncology | Admitting: Radiation Oncology

## 2019-08-05 DIAGNOSIS — Z5112 Encounter for antineoplastic immunotherapy: Secondary | ICD-10-CM | POA: Diagnosis not present

## 2019-08-06 ENCOUNTER — Ambulatory Visit: Payer: Managed Care, Other (non HMO) | Admitting: Orthotics

## 2019-08-06 ENCOUNTER — Ambulatory Visit
Admission: RE | Admit: 2019-08-06 | Discharge: 2019-08-06 | Disposition: A | Discharge status: Home / Self Care | Payer: Managed Care, Other (non HMO) | Source: Ambulatory Visit | Attending: Radiation Oncology | Admitting: Radiation Oncology

## 2019-08-06 ENCOUNTER — Other Ambulatory Visit: Payer: Self-pay

## 2019-08-06 DIAGNOSIS — Z5112 Encounter for antineoplastic immunotherapy: Secondary | ICD-10-CM | POA: Diagnosis not present

## 2019-08-06 DIAGNOSIS — M79671 Pain in right foot: Secondary | ICD-10-CM

## 2019-08-06 DIAGNOSIS — M79672 Pain in left foot: Secondary | ICD-10-CM

## 2019-08-06 DIAGNOSIS — M2041 Other hammer toe(s) (acquired), right foot: Secondary | ICD-10-CM

## 2019-08-06 DIAGNOSIS — M2141 Flat foot [pes planus] (acquired), right foot: Secondary | ICD-10-CM

## 2019-08-06 NOTE — Progress Notes (Signed)
Patient came in today to pick up custom made foot orthotics.  The goals were accomplished and the patient reported no dissatisfaction with said orthotics.  Patient was advised of breakin period and how to report any issues. 

## 2019-08-07 ENCOUNTER — Other Ambulatory Visit: Payer: Self-pay

## 2019-08-07 ENCOUNTER — Ambulatory Visit (HOSPITAL_BASED_OUTPATIENT_CLINIC_OR_DEPARTMENT_OTHER)
Admission: RE | Admit: 2019-08-07 | Discharge: 2019-08-07 | Disposition: A | Discharge status: Home / Self Care | Payer: Managed Care, Other (non HMO) | Source: Ambulatory Visit | Attending: Cardiology | Admitting: Cardiology

## 2019-08-07 ENCOUNTER — Ambulatory Visit
Admission: RE | Admit: 2019-08-07 | Discharge: 2019-08-07 | Disposition: A | Discharge status: Home / Self Care | Payer: Managed Care, Other (non HMO) | Source: Ambulatory Visit | Attending: Radiation Oncology | Admitting: Radiation Oncology

## 2019-08-07 DIAGNOSIS — C50512 Malignant neoplasm of lower-outer quadrant of left female breast: Secondary | ICD-10-CM

## 2019-08-07 DIAGNOSIS — Z170 Estrogen receptor positive status [ER+]: Secondary | ICD-10-CM | POA: Insufficient documentation

## 2019-08-07 DIAGNOSIS — Z5112 Encounter for antineoplastic immunotherapy: Secondary | ICD-10-CM | POA: Diagnosis not present

## 2019-08-07 NOTE — Progress Notes (Signed)
  Echocardiogram 2D Echocardiogram has been performed.  Bobbye Charleston 08/07/2019, 11:58 AM

## 2019-08-10 ENCOUNTER — Other Ambulatory Visit: Payer: Self-pay

## 2019-08-10 ENCOUNTER — Ambulatory Visit
Admission: RE | Admit: 2019-08-10 | Discharge: 2019-08-10 | Disposition: A | Discharge status: Home / Self Care | Payer: Managed Care, Other (non HMO) | Source: Ambulatory Visit | Attending: Radiation Oncology | Admitting: Radiation Oncology

## 2019-08-10 DIAGNOSIS — Z5112 Encounter for antineoplastic immunotherapy: Secondary | ICD-10-CM | POA: Diagnosis not present

## 2019-08-11 ENCOUNTER — Other Ambulatory Visit: Payer: Self-pay

## 2019-08-11 ENCOUNTER — Ambulatory Visit
Admission: RE | Admit: 2019-08-11 | Discharge: 2019-08-11 | Disposition: A | Discharge status: Home / Self Care | Payer: Managed Care, Other (non HMO) | Source: Ambulatory Visit | Attending: Radiation Oncology | Admitting: Radiation Oncology

## 2019-08-11 DIAGNOSIS — Z5112 Encounter for antineoplastic immunotherapy: Secondary | ICD-10-CM | POA: Diagnosis not present

## 2019-08-12 ENCOUNTER — Ambulatory Visit
Admission: RE | Admit: 2019-08-12 | Discharge: 2019-08-12 | Disposition: A | Discharge status: Home / Self Care | Payer: Managed Care, Other (non HMO) | Source: Ambulatory Visit | Attending: Radiation Oncology | Admitting: Radiation Oncology

## 2019-08-12 ENCOUNTER — Other Ambulatory Visit: Payer: Self-pay

## 2019-08-12 DIAGNOSIS — Z5112 Encounter for antineoplastic immunotherapy: Secondary | ICD-10-CM | POA: Diagnosis not present

## 2019-08-13 ENCOUNTER — Other Ambulatory Visit: Payer: Self-pay

## 2019-08-13 ENCOUNTER — Ambulatory Visit (INDEPENDENT_AMBULATORY_CARE_PROVIDER_SITE_OTHER): Payer: Managed Care, Other (non HMO) | Admitting: Sports Medicine

## 2019-08-13 ENCOUNTER — Ambulatory Visit
Admission: RE | Admit: 2019-08-13 | Discharge: 2019-08-13 | Disposition: A | Discharge status: Home / Self Care | Payer: Managed Care, Other (non HMO) | Source: Ambulatory Visit | Attending: Radiation Oncology | Admitting: Radiation Oncology

## 2019-08-13 ENCOUNTER — Encounter: Payer: Self-pay | Admitting: Radiation Oncology

## 2019-08-13 ENCOUNTER — Ambulatory Visit: Payer: Managed Care, Other (non HMO)

## 2019-08-13 ENCOUNTER — Ambulatory Visit: Payer: Self-pay

## 2019-08-13 VITALS — BP 130/70 | Ht 67.0 in | Wt 156.0 lb

## 2019-08-13 DIAGNOSIS — M25511 Pain in right shoulder: Secondary | ICD-10-CM | POA: Diagnosis not present

## 2019-08-13 DIAGNOSIS — Z5112 Encounter for antineoplastic immunotherapy: Secondary | ICD-10-CM | POA: Diagnosis not present

## 2019-08-13 MED ORDER — NITROGLYCERIN 0.2 MG/HR TD PT24: MEDICATED_PATCH | TRANSDERMAL | 1 refills | Status: DC

## 2019-08-14 ENCOUNTER — Ambulatory Visit: Payer: Managed Care, Other (non HMO)

## 2019-08-14 ENCOUNTER — Other Ambulatory Visit: Payer: Self-pay | Admitting: Oncology

## 2019-08-14 ENCOUNTER — Encounter: Payer: Self-pay | Admitting: *Deleted

## 2019-08-14 NOTE — Progress Notes (Signed)
   Subjective:    Patient ID: Allison Hanna, female    DOB: 1961/06/18, 58 y.o.   MRN: [id number]  HPI   Patient comes in today for follow-up on right shoulder pain.  Previous ultrasound showed a partial thickness articular sided supraspinatus tear.  She has been using nitroglycerin patches and doing home exercises.  Overall she reports about a 30% improvement.  She is now able to reach across her body much more comfortably than before.  She has some days where she feels really good.  Other days for her shoulder is quite sore.    Review of Systems    As above Objective:   Physical Exam  Well-developed, well-nourished.  No acute distress.  Awake alert and oriented x3.  Vital signs reviewed.  Right shoulder: Full range of motion.  There is no tenderness to palpation.  Positive painful arc.  Rotator cuff strength remains 5/5 with slight pain with resisted supraspinatus.  Neurovascular intact distally.  Limited right shoulder ultrasound still shows hypoechoic change in the deep fibers of the distal supraspinatus tendon at the anterior insertion.  Slightly improved from previous ultrasound.      Assessment & Plan:   Right shoulder pain with ultrasound evidence of partial thickness supraspinatus tendon tear  Patient will continue with current treatment including topical nitroglycerin and home exercises.  She has been using the nitroglycerin patch a little too posterior on her shoulder so I have educated her on where exactly I would like for her to place this.  She will follow-up with me again in 4 to 6 weeks for reevaluation and repeat ultrasound.  If symptoms do not continue to improve then we could consider formal physical therapy or further diagnostic imaging.

## 2019-08-24 ENCOUNTER — Encounter: Payer: Self-pay | Admitting: *Deleted

## 2019-08-25 ENCOUNTER — Other Ambulatory Visit: Payer: Self-pay

## 2019-08-25 ENCOUNTER — Inpatient Hospital Stay: Payer: Managed Care, Other (non HMO)

## 2019-08-25 VITALS — BP 151/92 | HR 67 | Temp 98.0°F | Resp 17 | Wt 160.0 lb

## 2019-08-25 DIAGNOSIS — Z5112 Encounter for antineoplastic immunotherapy: Secondary | ICD-10-CM | POA: Diagnosis not present

## 2019-08-25 DIAGNOSIS — Z170 Estrogen receptor positive status [ER+]: Secondary | ICD-10-CM

## 2019-08-25 DIAGNOSIS — Z95828 Presence of other vascular implants and grafts: Secondary | ICD-10-CM

## 2019-08-25 DIAGNOSIS — C50512 Malignant neoplasm of lower-outer quadrant of left female breast: Secondary | ICD-10-CM

## 2019-08-25 LAB — CBC WITH DIFFERENTIAL/PLATELET
Abs Immature Granulocytes: 0.02 10*3/uL (ref 0.00–0.07)
Basophils Absolute: 0.1 10*3/uL (ref 0.0–0.1)
Basophils Relative: 1 %
Eosinophils Absolute: 0.1 10*3/uL (ref 0.0–0.5)
Eosinophils Relative: 2 %
HCT: 34 % — ABNORMAL LOW (ref 36.0–46.0)
Hemoglobin: 11.6 g/dL — ABNORMAL LOW (ref 12.0–15.0)
Immature Granulocytes: 0 %
Lymphocytes Relative: 13 %
Lymphs Abs: 0.6 10*3/uL — ABNORMAL LOW (ref 0.7–4.0)
MCH: 31.5 pg (ref 26.0–34.0)
MCHC: 34.1 g/dL (ref 30.0–36.0)
MCV: 92.4 fL (ref 80.0–100.0)
Monocytes Absolute: 0.5 10*3/uL (ref 0.1–1.0)
Monocytes Relative: 11 %
Neutro Abs: 3.3 10*3/uL (ref 1.7–7.7)
Neutrophils Relative %: 73 %
Platelets: 238 10*3/uL (ref 150–400)
RBC: 3.68 MIL/uL — ABNORMAL LOW (ref 3.87–5.11)
RDW: 12 % (ref 11.5–15.5)
WBC: 4.7 10*3/uL (ref 4.0–10.5)
nRBC: 0 % (ref 0.0–0.2)

## 2019-08-25 LAB — COMPREHENSIVE METABOLIC PANEL
ALT: 15 U/L (ref 0–44)
AST: 17 U/L (ref 15–41)
Albumin: 4.1 g/dL (ref 3.5–5.0)
Alkaline Phosphatase: 42 U/L (ref 38–126)
Anion gap: 11 (ref 5–15)
BUN: 18 mg/dL (ref 6–20)
CO2: 25 mmol/L (ref 22–32)
Calcium: 8.9 mg/dL (ref 8.9–10.3)
Chloride: 100 mmol/L (ref 98–111)
Creatinine, Ser: 0.79 mg/dL (ref 0.44–1.00)
GFR calc Af Amer: >60 mL/min (ref >60)
GFR calc non Af Amer: >60 mL/min (ref >60)
Glucose, Bld: 98 mg/dL (ref 70–99)
Potassium: 4.2 mmol/L (ref 3.5–5.1)
Sodium: 136 mmol/L (ref 135–145)
Total Bilirubin: 0.3 mg/dL (ref 0.3–1.2)
Total Protein: 7.1 g/dL (ref 6.5–8.1)

## 2019-08-25 MED ORDER — TRASTUZUMAB-DKST CHEMO 150 MG IV SOLR
450.0000 mg | Freq: Once | INTRAVENOUS | Status: AC
  Administered 2019-08-25: 450 mg via INTRAVENOUS
  Filled 2019-08-25: qty 21.43

## 2019-08-25 MED ORDER — SODIUM CHLORIDE 0.9% FLUSH
10.0000 mL | INTRAVENOUS | Status: DC | PRN
  Administered 2019-08-25: 10 mL
  Filled 2019-08-25: qty 10

## 2019-08-25 MED ORDER — DIPHENHYDRAMINE HCL 25 MG PO CAPS
25.0000 mg | ORAL_CAPSULE | Freq: Once | ORAL | Status: AC
  Administered 2019-08-25: 25 mg via ORAL

## 2019-08-25 MED ORDER — HEPARIN SOD (PORK) LOCK FLUSH 100 UNIT/ML IV SOLN
500.0000 [IU] | Freq: Once | INTRAVENOUS | Status: AC | PRN
  Administered 2019-08-25: 500 [IU]
  Filled 2019-08-25: qty 5

## 2019-08-25 MED ORDER — ACETAMINOPHEN 325 MG PO TABS
ORAL_TABLET | ORAL | Status: AC
  Filled 2019-08-25: qty 2

## 2019-08-25 MED ORDER — ACETAMINOPHEN 325 MG PO TABS
650.0000 mg | ORAL_TABLET | Freq: Once | ORAL | Status: AC
  Administered 2019-08-25: 650 mg via ORAL

## 2019-08-25 MED ORDER — DIPHENHYDRAMINE HCL 25 MG PO CAPS
ORAL_CAPSULE | ORAL | Status: AC
  Filled 2019-08-25: qty 1

## 2019-08-25 MED ORDER — SODIUM CHLORIDE 0.9 % IV SOLN
Freq: Once | INTRAVENOUS | Status: AC
  Filled 2019-08-25: qty 250

## 2019-08-25 NOTE — Patient Instructions (Signed)
[facility] Discharge Instructions for Patients Receiving Chemotherapy  Today you received the following chemotherapy agents trastuzumab-dkst   To help prevent nausea and vomiting after your treatment, we encourage you to take your nausea medication as directed.   If you develop nausea and vomiting that is not controlled by your nausea medication, call the clinic.   BELOW ARE SYMPTOMS THAT SHOULD BE REPORTED IMMEDIATELY:  *FEVER GREATER THAN 100.5 F  *CHILLS WITH OR WITHOUT FEVER  NAUSEA AND VOMITING THAT IS NOT CONTROLLED WITH YOUR NAUSEA MEDICATION  *UNUSUAL SHORTNESS OF BREATH  *UNUSUAL BRUISING OR BLEEDING  TENDERNESS IN MOUTH AND THROAT WITH OR WITHOUT PRESENCE OF ULCERS  *URINARY PROBLEMS  *BOWEL PROBLEMS  UNUSUAL RASH Items with * indicate a potential emergency and should be followed up as soon as possible.  Feel free to call the clinic should you have any questions or concerns. The clinic phone number is [phone].  Please show the CHEMO ALERT CARD at check-in to the Emergency Department and triage nurse.   

## 2019-08-26 NOTE — Progress Notes (Incomplete)
  Patient Name: Allison Hanna MRN: [id number] DOB: March 10, 1961 Referring Physician: Merrilee Seashore (Profile Not Attached) Date of Service: 08/13/2019 [facility], [place]                                                        End Of Treatment Note  Diagnoses: C50.512-Malignant neoplasm of lower-outer quadrant of left female breast  Cancer Staging: Stage IA (pT1c, pN0) Left Breast LOQ, Invasive Ductal Carcinoma, ER+ / PR- / Her2+, Grade 2  Intent: Curative  Radiation Treatment Dates: 07/15/2019 through 08/13/2019 Site Technique Total Dose (Gy) Dose per Fx (Gy) Completed Fx Beam Energies  Breast, Left: Breast_Lt 3D 40.05/40.05 2.67 15/15 6X  Breast, Left: Breast_Lt_Bst 3D 12/12 2 6/6 6X, 10X   Narrative: The patient tolerated radiation therapy relatively well. She did report some mild fatigue and left breast soreness throughout. The left breast showed minimal erythema and hyperpigmentation changes.  Plan: The patient will follow-up with radiation oncology in one month.   ________________________________________________   Blair Promise, PhD, MD  This document serves as a record of services personally performed by Gery Pray, MD. It was created on his behalf by Clerance Lav, a trained medical scribe. The creation of this record is based on the scribe's personal observations and the provider's statements to them. This document has been checked and approved by the attending provider.

## 2019-09-07 ENCOUNTER — Other Ambulatory Visit: Payer: Self-pay | Admitting: Sports Medicine

## 2019-09-10 ENCOUNTER — Ambulatory Visit (INDEPENDENT_AMBULATORY_CARE_PROVIDER_SITE_OTHER): Payer: Managed Care, Other (non HMO) | Admitting: Sports Medicine

## 2019-09-10 ENCOUNTER — Other Ambulatory Visit: Payer: Self-pay

## 2019-09-10 VITALS — BP 124/84 | Ht 67.0 in | Wt 157.0 lb

## 2019-09-10 DIAGNOSIS — M25511 Pain in right shoulder: Secondary | ICD-10-CM | POA: Diagnosis not present

## 2019-09-10 NOTE — Progress Notes (Signed)
   Subjective:    Patient ID: Allison Hanna, female    DOB: Jun 07, 1961, 59 y.o.   MRN: [id number]  HPI   Allison Hanna comes in today for follow-up on a right shoulder rotator cuff tear.  Overall, she is about 40% improved over the past 2 months.  She has some days where she feels pretty good and other days where her arm is quite sore.  She does have some pain at night occasionally when sleeping on her right side.  She is tolerating her topical nitroglycerin.  She is doing her home exercises.    Review of Systems As above    Objective:   Physical Exam  Well-developed, well-nourished.  No acute distress.  Awake alert oriented x3.  Vital signs reviewed  Right shoulder: Patient continues to demonstrate full active and passive range of motion.  She has full rotator cuff strength but does have reproducible pain with resisted supraspinatus and resisted external rotation.  Neurovascular intact distally.  MSK ultrasound of the right shoulder was performed.  Previous area of hypoechoic change seen in the anterior fibers of the distal supraspinatus are less obvious on today's scan.      Assessment & Plan:   Right shoulder pain secondary to partial thickness rotator cuff tear  Patient's ultrasound today shows improvement in the distal supraspinatus.  However, the patient continues to struggle to improve.  Although her improvement is slow, she does feel like she is making progress.  Therefore, we will continue with our current treatment including topical nitroglycerin and home exercises for the next 4 weeks.  She will follow up with me again at the end of that time.  If she plateaus with her improvement then I would consider further diagnostic imaging.  Patient is encouraged to call with questions or concerns in the interim.

## 2019-09-10 NOTE — Patient Instructions (Signed)
  It was great seeing you today! The ultrasound shows your rotator cuff tear to be healing. Continue with your treatment, including the nitroglycerin patches for 4 more weeks. Although the ultrasound looks good today, I would want to get an MRI if you do not continue to get better. Please call me with questions or concerns if you need me before follow-up

## 2019-09-14 NOTE — Progress Notes (Signed)
[place]  [phone]) [phone] [phone]     ID: Allison Hanna DOB: 10-Dec-1960  MR#: [id number]  SFK#:[id number]  Patient Care Team: Merrilee Seashore, MD as PCP - General (Internal Medicine) Mauro Kaufmann, RN as Oncology Nurse Navigator Rockwell Germany, RN as Oncology Nurse Navigator Sallyann Kinnaird, Virgie Dad, MD as Consulting Physician (Oncology) Gery Pray, MD as Consulting Physician (Radiation Oncology) Fanny Skates, MD as Consulting Physician (General Surgery) Brien Few, MD as Consulting Physician (Obstetrics and Gynecology) Larey Dresser, MD as Consulting Physician (Cardiology) Feliberto Harts R (Inactive) Felipa Furnace, DPM as Consulting Physician (Podiatry) Chauncey Cruel, MD OTHER MD:  CHIEF COMPLAINT: Estrogen receptor positive breast cancer  CURRENT TREATMENT: trastuzumab, tamoxifen   INTERVAL HISTORY: Allison Hanna returns today for follow-up of her estrogen receptor positive breast cancer.   She continues on adjuvant trastuzumab (Ogivri).    She has had no problems with the port or with the infusions. Since her last visit, she underwent repeat echocardiogram on 08/07/2019, which showed an ejection fraction of 60-65%.  She received radiation therapy from 07/15/2019 through 08/13/2019.  She did very well until the end of when she started feeling tired and she still feels a little bit tired.  She had some erythema but no real desquamation.  She also underwent cardiac scoring CT on 07/09/2019, which showed a coronary calcium score of 0.   She has been followed by Dr. Micheline Chapman for right shoulder pain since 06/25/2019. She underwent right upper extremity joint ultrasound on 07/09/2019, which showed findings consistent with partial thickness rotator cuff tear. She was started on home exercises and topical nitroglycerin. She last saw Dr. Micheline Chapman on 09/10/2019, reporting about 40% improvement.   REVIEW OF SYSTEMS: Allison Hanna's hair is coming  in a bit thin and a bit gray.  She is hoping it will get thicker now that she is done with chemotherapy.  She is having problems with vaginal dryness since she stopped using the Estrace cream.  She is having mild hot flashes.  She is eager to get the COVID-19 vaccine but has not been able to sign up yet.  She and her husband are keeping appropriate pandemic precautions.  A detailed review of systems was otherwise stable   HISTORY OF CURRENT ILLNESS: From the original intake note:  "Allison Hanna" presented with a palpable left breast mass. She initially experienced soreness to her left breast around April 2020 and tried to get a mammogram at this point, but due to pandemic concerns, the scan was delayed.  She did not actually feel a mass in the breast until June 1.  She underwent bilateral diagnostic mammography with tomography and left breast ultrasonography at [facility] on 02/11/2019 showing: breast density category B; irregular hyperechoic mass in the left breast at 4 o'clock 5 cm from the nipple measuring 8 mm; no enlarged adenopathy in the left axilla.  Accordingly on 02/12/2019 she proceeded to biopsy of the left breast area in question. The pathology from this procedure ([id number]) showed: invasive mammary carcinoma, e-cadherin positive. Prognostic indicators significant for: estrogen receptor, 100% positive with strong staining intensity and progesterone receptor, 0% negative. Proliferation marker Ki67 at 25%. HER2 positive by immunohistochemistry, (3+).  The patient's subsequent history is as detailed below.   PAST MEDICAL HISTORY: Past Medical History:  Diagnosis Date  . Anxiety   . Family history of breast cancer   . Family history of melanoma   . High cholesterol   . Hypothyroid   . Paralyzed  vocal cords 2014  . Raynauds syndrome    cold weather triggers fingers to turn white  History of palpitations (resolved)   PAST SURGICAL HISTORY: Past Surgical History:  Procedure  Laterality Date  . BREAST LUMPECTOMY WITH RADIOACTIVE SEED AND SENTINEL LYMPH NODE BIOPSY Left 03/05/2019   Procedure: LEFT BREAST LUMPECTOMY WITH RADIOACTIVE SEED AND LEFT AXILLARY DEEP SENTINEL LYMPH NODE BIOPSY INJECT BLUE DYE LEFT BREAST;  Surgeon: Fanny Skates, MD;  Location: [facility];  Service: General;  Laterality: Left;  . KNEE SURGERY Bilateral    arthroscopy for menicus tears  . PORTACATH PLACEMENT Right 03/05/2019   Procedure: INSERTION PORT-A-CATH;  Surgeon: Fanny Skates, MD;  Location: [facility];  Service: General;  Laterality: Right;  . WRIST SURGERY      FAMILY HISTORY: Family History  Problem Relation Age of Onset  . Breast cancer Mother 9  . Cervical cancer Mother   . Heart attack Father   . Skin cancer Brother   . Breast cancer Paternal Aunt        dx over 8s  . Breast cancer Paternal Aunt        dx over 28  . Uterine cancer Paternal Aunt 22  . Cervical cancer Maternal Aunt   . Liver cancer Paternal Uncle 77  . Stroke Maternal Grandmother   . Parkinson's disease Paternal Grandfather   . Breast cancer Cousin 80       mother's maternal first cousin  . Melanoma Cousin 39       pat first cousin  Patient's father was 43 years old when he died from heart attack. Patient's mother died at age 48. She was diagnosed with breast cancer at age 69. Two paternal aunts were diagnosed with breast cancer at older ages, one of which also had uterine cancer. She has 1 brother. He has a history of skin cancer.    GYNECOLOGIC HISTORY:  No LMP recorded. Patient is postmenopausal. Menarche: 59 years old Age at first live birth: 59 years old [place] P 3 LMP 2007 Contraceptive: yes, pill for about 8 years, no complications HRT no  Hysterectomy? no BSO? 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 She is married. [name] is in charge of operations for  [employer]. She lives at home with her husband. [name], age 21, lives in [place], [place] as a Research scientist (life sciences). [name], age 20, lives in [place] as a Careers adviser. [name], age 63, lives in [place] as a Ship broker and works part-time in [employer]. She has no grandchildren.     ADVANCED DIRECTIVES: [name] is automatically her HCPOA.   HEALTH MAINTENANCE: Social History   Tobacco Use  . Smoking status: Never Smoker  . Smokeless tobacco: Never Used  Substance Use Topics  . Alcohol use: Yes    Alcohol/week: 2.0 standard drinks    Types: 2 Standard drinks or equivalent per week  . Drug use: Never     Colonoscopy: 2010  PAP: 08/2018  Bone density: 2019, osteopenia   No Known Allergies  Current Outpatient Medications  Medication Sig Dispense Refill  . B Complex-Folic Acid (SUPER B COMPLEX MAXI) TABS Take by mouth.    . calcium carbonate (OS-CAL) 600 MG TABS tablet Take 1,200 mg by mouth 2 (two) times daily with a meal.    . Cholecalciferol (VITAMIN D3) 25 MCG (1000 UT) CAPS Take 2,000 Units  by mouth daily.    Marland Kitchen estradiol (ESTRACE VAGINAL) 0.1 MG/GM vaginal cream Place 1 Applicatorful vaginally at bedtime. 42.5 g 12  . estradiol (ESTRING) 2 MG vaginal ring Place 2 mg vaginally every 3 (three) months. follow package directions 1 each 12  . Levothyroxine Sodium (SYNTHROID PO) Take 75 mcg by mouth daily.     . Magnesium 250 MG TABS Take 250 mg by mouth daily.    . nitroGLYCERIN (NITRODUR - DOSED IN MG/24 HR) 0.2 mg/hr patch USE 1/4 PATCH DAILY TO THE AFFECTED AREA. 30 patch 1  . prochlorperazine (COMPAZINE) 10 MG tablet TAKE 1 TABLET (10 MG TOTAL) BY MOUTH EVERY 8 (EIGHT) HOURS AS NEEDED FOR NAUSEA OR VOMITING. 20 tablet 0  . rosuvastatin (CRESTOR) 5 MG tablet Take 1 tablet (5 mg total) by mouth daily. 30 tablet 12  . tamoxifen (NOLVADEX) 20 MG tablet Take 1 tablet (20 mg total) by mouth daily. 90 tablet 4  . vitamin C (ASCORBIC ACID) 500 MG tablet Take  500 mg by mouth daily.     No current facility-administered medications for this visit.   Facility-Administered Medications Ordered in Other Visits  Medication Dose Route Frequency Provider Last Rate Last Admin  . sodium chloride flush (NS) 0.9 % injection 10 mL  10 mL Intracatheter PRN Yusuf Yu, Virgie Dad, MD      . sodium chloride flush (NS) 0.9 % injection 10 mL  10 mL Intracatheter PRN Leigh Blas, Virgie Dad, MD   10 mL at 09/15/19 1422    OBJECTIVE: Middle-aged white woman who appears stated age  71:   09/15/19 1203  BP: (!) 142/80  Pulse: 66  Resp: 20  Temp: 98.3 F (36.8 C)  SpO2: 98%   Wt Readings from Last 3 Encounters:  09/15/19 160 lb 9.6 oz (72.8 kg)  09/10/19 157 lb (71.2 kg)  08/25/19 160 lb (72.6 kg)   Body mass index is 25.15 kg/m.    ECOG FS:1 - Symptomatic but completely ambulatory  Sclerae unicteric, EOMs intact Wearing a mask No cervical or supraclavicular adenopathy Lungs no rales or rhonchi Heart regular rate and rhythm Abd soft, nontender, positive bowel sounds MSK no focal spinal tenderness, no upper extremity lymphedema Neuro: nonfocal, well oriented, appropriate affect Breasts: Right breast is unremarkable.  The left breast is status post lumpectomy and radiation.  There is minimal soreness to palpation, but overall the cosmetic result is good.  There is some residual erythema.  Both axillae are benign.   LAB RESULTS:  CMP     Component Value Date/Time   NA 135 09/15/2019 1134   K 4.0 09/15/2019 1134   CL 101 09/15/2019 1134   CO2 25 09/15/2019 1134   GLUCOSE 99 09/15/2019 1134   BUN 19 09/15/2019 1134   CREATININE 0.77 09/15/2019 1134   CREATININE 0.81 05/12/2019 1145   CALCIUM 8.7 (L) 09/15/2019 1134   PROT 6.8 09/15/2019 1134   ALBUMIN 3.9 09/15/2019 1134   AST 18 09/15/2019 1134   AST 19 05/12/2019 1145   ALT 13 09/15/2019 1134   ALT 16 05/12/2019 1145   ALKPHOS 40 09/15/2019 1134   BILITOT 0.3 09/15/2019 1134   BILITOT 0.3  05/12/2019 1145   GFRNONAA >60 09/15/2019 1134   GFRNONAA >60 05/12/2019 1145   GFRAA >60 09/15/2019 1134   GFRAA >60 05/12/2019 1145    No results found for: TOTALPROTELP, ALBUMINELP, A1GS, A2GS, BETS, BETA2SER, GAMS, MSPIKE, SPEI  No results found for: KPAFRELGTCHN, LAMBDASER, KAPLAMBRATIO  Lab Results  Component Value Date   WBC 3.8 (L) 09/15/2019   NEUTROABS 2.7 09/15/2019   HGB 11.3 (L) 09/15/2019   HCT 33.7 (L) 09/15/2019   MCV 93.4 09/15/2019   PLT 250 09/15/2019    _0 @  No results found for: LABCA2  No components found for: FVCBSW967  No results for input(s): INR in the last 168 hours.  No results found for: LABCA2  No results found for: RFF638  No results found for: GYK599  No results found for: JTT017  No results found for: CA2729  No components found for: HGQUANT  No results found for: CEA1 / No results found for: CEA1   No results found for: AFPTUMOR  No results found for: CHROMOGRNA  No results found for: PSA1  Appointment on 09/15/2019  Component Date Value Ref Range Status  . Sodium 09/15/2019 135  135 - 145 mmol/L Final  . Potassium 09/15/2019 4.0  3.5 - 5.1 mmol/L Final  . Chloride 09/15/2019 101  98 - 111 mmol/L Final  . CO2 09/15/2019 25  22 - 32 mmol/L Final  . Glucose, Bld 09/15/2019 99  70 - 99 mg/dL Final  . BUN 09/15/2019 19  8 - 23 mg/dL Final  . Creatinine, Ser 09/15/2019 0.77  0.44 - 1.00 mg/dL Final  . Calcium 09/15/2019 8.7* 8.9 - 10.3 mg/dL Final  . Total Protein 09/15/2019 6.8  6.5 - 8.1 g/dL Final  . Albumin 09/15/2019 3.9  3.5 - 5.0 g/dL Final  . AST 09/15/2019 18  15 - 41 U/L Final  . ALT 09/15/2019 13  0 - 44 U/L Final  . Alkaline Phosphatase 09/15/2019 40  38 - 126 U/L Final  . Total Bilirubin 09/15/2019 0.3  0.3 - 1.2 mg/dL Final  . GFR calc non Af Amer 09/15/2019 >60  >60 mL/min Final  . GFR calc Af Amer 09/15/2019 >60  >60 mL/min Final  . Anion gap 09/15/2019 9  5 - 15 Final   Performed at [facility], [place] [address] [place], [place] [address]  . WBC 09/15/2019 3.8* 4.0 - 10.5 K/uL Final  . RBC 09/15/2019 3.61* 3.87 - 5.11 MIL/uL Final  . Hemoglobin 09/15/2019 11.3* 12.0 - 15.0 g/dL Final  . HCT 09/15/2019 33.7* 36.0 - 46.0 % Final  . MCV 09/15/2019 93.4  80.0 - 100.0 fL Final  . MCH 09/15/2019 31.3  26.0 - 34.0 pg Final  . MCHC 09/15/2019 33.5  30.0 - 36.0 g/dL Final  . RDW 09/15/2019 11.7  11.5 - 15.5 % Final  . Platelets 09/15/2019 250  150 - 400 K/uL Final  . nRBC 09/15/2019 0.0  0.0 - 0.2 % Final  . Neutrophils Relative % 09/15/2019 70  % Final  . Neutro Abs 09/15/2019 2.7  1.7 - 7.7 K/uL Final  . Lymphocytes Relative 09/15/2019 14  % Final  . Lymphs Abs 09/15/2019 0.5* 0.7 - 4.0 K/uL Final  . Monocytes Relative 09/15/2019 12  % Final  . Monocytes Absolute 09/15/2019 0.5  0.1 - 1.0 K/uL Final  . Eosinophils Relative 09/15/2019 2  % Final  . Eosinophils Absolute 09/15/2019 0.1  0.0 - 0.5 K/uL Final  . Basophils Relative 09/15/2019 2  % Final  . Basophils Absolute 09/15/2019 0.1  0.0 - 0.1 K/uL Final  . Immature Granulocytes 09/15/2019 0  % Final  . Abs Immature Granulocytes 09/15/2019 0.01  0.00 - 0.07 K/uL Final   Performed at [facility], [address] [place], [place]  [address]    (this displays the last labs from the last 3 days)  No results found for: TOTALPROTELP, ALBUMINELP, A1GS, A2GS, BETS, BETA2SER, GAMS, MSPIKE, SPEI (this displays SPEP labs)  No results found for: KPAFRELGTCHN, LAMBDASER, KAPLAMBRATIO (kappa/lambda light chains)  No results found for: HGBA, HGBA2QUANT, HGBFQUANT, HGBSQUAN (Hemoglobinopathy evaluation)   No results found for: LDH  No results found for: IRON, TIBC, IRONPCTSAT (Iron and TIBC)  No results found for: FERRITIN  Urinalysis No results found for: COLORURINE, APPEARANCEUR, LABSPEC, PHURINE, GLUCOSEU, HGBUR, BILIRUBINUR, KETONESUR, PROTEINUR, UROBILINOGEN, NITRITE,  LEUKOCYTESUR   STUDIES: No results found.  ELIGIBLE FOR AVAILABLE RESEARCH PROTOCOL: no  ASSESSMENT: 59 y.o. [place] woman status post left breast biopsy 02/12/2019 for a clinical T1b N0, stage IA invasive ductal carcinoma, estrogen receptor positive, progesterone receptor negative, HER-2 amplified, with an MIB-1-1 of 25%  (1) status post left lumpectomy and sentinel lymph node sampling 03/05/2019 for a pT1c pN0, stage IA invasive ductal carcinoma, grade 2, with positive lymphovascular invasion but negative margins  (a) a total of 8 lymph nodes removed (4 sentinel)   (2) adjuvant chemotherapy with paclitaxel and trastuzumab weekly x12 started 04/07/2019  (a) paclitaxel stopped after 4 doses because of peripheral neuropathy  (b) cyclophosphamide, methotrexate, fluorouracil started 05/12/2019, to be repeated q. 21 days x 3  (3) trastuzumab to be continued to complete 1 year (through August 2021)  (a) echo 02/26/2019 shows an ejection fraction in the 60-65% range  (b) echo 05/27/2019 shows an EF of 55-60%  (4) adjuvant radiation 07/15/2019 - 08/13/2019  (a) left breast / 40.05 Gy in 15 fractions  (b) boost / 12 Gy in 6 fractions  (5) to start tamoxifen 09/16/2019  (6) genetics testing 02/25/2019 through the Multi-Gene Panel offered by [facility] found no deleterious mutations in AIP, ALK, APC, ATM, AXIN2,BAP1,  BARD1, BLM, BMPR1A, BRCA1, BRCA2, BRIP1, CASR, CDC73, CDH1, CDK4, CDKN1B, CDKN1C, CDKN2A (p14ARF), CDKN2A (p16INK4a), CEBPA, CHEK2, CTNNA1, DICER1, DIS3L2, EGFR (c.2369C>T, p.Thr790Met variant only), EPCAM (Deletion/duplication testing only), FH, FLCN, GATA2, GPC3, GREM1 (Promoter region deletion/duplication testing only), HOXB13 (c.251G>A, p.Gly84Glu), HRAS, KIT, MAX, MEN1, MET, MITF (c.952G>A, p.Glu318Lys variant only), MLH1, MSH2, MSH3, MSH6, MUTYH, NBN, NF1, NF2, NTHL1, PALB2, PDGFRA, PHOX2B, PMS2, POLD1, POLE, POT1, PRKAR1A, PTCH1, PTEN, RAD50, RAD51C, RAD51D, RB1, RECQL4, RET,  RNF43, RUNX1, SDHAF2, SDHA (sequence changes only), SDHB, SDHC, SDHD, SMAD4, SMARCA4, SMARCB1, SMARCE1, STK11, SUFU, TERC, TERT, TMEM127, TP53, TSC1, TSC2, VHL, WRN and WT1.    PLAN: Allison Hanna completed her surgery chemotherapy and radiation.  She is still receiving trastuzumab every 3 weeks and will continue through August.  Her next echocardiogram will be due in March.  Today we discussed the difference between tamoxifen and anastrozole in detail. She understands that anastrozole and the aromatase inhibitors in general work by blocking estrogen production. Accordingly vaginal dryness, decrease in bone density, and of course hot flashes can result. The aromatase inhibitors can also negatively affect the cholesterol profile, although that is a minor effect. One out of 5 women on aromatase inhibitors we will feel "old and achy". This arthralgia/myalgia syndrome, which resembles fibromyalgia clinically, does resolve with stopping the medications. Accordingly this is not a reason to not try an aromatase inhibitor but it is a frequent reason to stop it (in other words 20% of women will not be able to tolerate these medications).  Tamoxifen on the other hand does not block estrogen production. It does not "take away a woman's estrogen". It blocks the estrogen receptor in breast cells. Like  anastrozole, it can also cause hot flashes. As opposed to anastrozole, tamoxifen has many estrogen-like effects. It is technically an estrogen receptor modulator. This means that in some tissues tamoxifen works like estrogen-- for example it helps strengthen the bones. It tends to improve the cholesterol profile. It can cause thickening of the endometrial lining, and even endometrial polyps or rarely cancer of the uterus.(The risk of uterine cancer due to tamoxifen is one additional cancer per thousand women year). It can cause vaginal wetness or stickiness. It can cause blood clots through this estrogen-like effect--the risk of  blood clots with tamoxifen is exactly the same as with birth control pills or hormone replacement.  Neither of these agents causes mood changes or weight gain, despite the popular belief that they can have these side effects. We have data from studies comparing either of these drugs with placebo, and in those cases the control group had the same amount of weight gain and depression as the group that took the drug.  She is very interested in tamoxifen because she was using Estrace cream before with some success.  She has a good understanding of the risk of clots in the rare case of endometrial cancer.  The plan then is for her to start tamoxifen and also start the Estrace cream which she will be able to continue so long as she remains on tamoxifen  We also discussed Estring.  She would like to give that a try and preference to the Estrace cream but of course we are not sure if her insurance company will agree.  Accordingly I entered both orders and she will make a decision which want to get out once she gets to the pharmacy.  She will have a repeat mammogram late March and she will see me again in April.  At that point I will likely set her up for a survivorship visit later in the year and then long-term follow-up  Total encounter time 35 minutes.*   Klayten Jolliff, Virgie Dad, MD  09/15/19 5:44 PM Medical Oncology and Hematology [facility] [place], [place] [address] Tel. [phone]    Fax. [phone]   I, Wilburn Mylar, am acting as scribe for Dr. Virgie Dad. Jawaan Adachi.  I, [name] MD, have reviewed the above documentation for accuracy and completeness, and I agree with the above.   *Total Encounter Time as defined by the Centers for Medicare and Medicaid Services includes, in addition to the face-to-face time of a patient visit (documented in the note above) non-face-to-face time: obtaining and reviewing outside history, ordering and reviewing medications,  tests or procedures, care coordination (communications with other health care professionals or caregivers) and documentation in the medical record.

## 2019-09-15 ENCOUNTER — Inpatient Hospital Stay: Payer: Managed Care, Other (non HMO)

## 2019-09-15 ENCOUNTER — Encounter: Payer: Self-pay | Admitting: *Deleted

## 2019-09-15 ENCOUNTER — Inpatient Hospital Stay: Payer: Managed Care, Other (non HMO) | Attending: Oncology | Admitting: Oncology

## 2019-09-15 ENCOUNTER — Other Ambulatory Visit: Payer: Self-pay

## 2019-09-15 VITALS — BP 142/80 | HR 66 | Temp 98.3°F | Resp 20 | Ht 67.0 in | Wt 160.6 lb

## 2019-09-15 DIAGNOSIS — Z5112 Encounter for antineoplastic immunotherapy: Secondary | ICD-10-CM | POA: Diagnosis not present

## 2019-09-15 DIAGNOSIS — C50512 Malignant neoplasm of lower-outer quadrant of left female breast: Secondary | ICD-10-CM

## 2019-09-15 DIAGNOSIS — Z95828 Presence of other vascular implants and grafts: Secondary | ICD-10-CM

## 2019-09-15 DIAGNOSIS — Z170 Estrogen receptor positive status [ER+]: Secondary | ICD-10-CM

## 2019-09-15 DIAGNOSIS — Z79899 Other long term (current) drug therapy: Secondary | ICD-10-CM | POA: Diagnosis not present

## 2019-09-15 LAB — CBC WITH DIFFERENTIAL/PLATELET
Abs Immature Granulocytes: 0.01 10*3/uL (ref 0.00–0.07)
Basophils Absolute: 0.1 10*3/uL (ref 0.0–0.1)
Basophils Relative: 2 %
Eosinophils Absolute: 0.1 10*3/uL (ref 0.0–0.5)
Eosinophils Relative: 2 %
HCT: 33.7 % — ABNORMAL LOW (ref 36.0–46.0)
Hemoglobin: 11.3 g/dL — ABNORMAL LOW (ref 12.0–15.0)
Immature Granulocytes: 0 %
Lymphocytes Relative: 14 %
Lymphs Abs: 0.5 10*3/uL — ABNORMAL LOW (ref 0.7–4.0)
MCH: 31.3 pg (ref 26.0–34.0)
MCHC: 33.5 g/dL (ref 30.0–36.0)
MCV: 93.4 fL (ref 80.0–100.0)
Monocytes Absolute: 0.5 10*3/uL (ref 0.1–1.0)
Monocytes Relative: 12 %
Neutro Abs: 2.7 10*3/uL (ref 1.7–7.7)
Neutrophils Relative %: 70 %
Platelets: 250 10*3/uL (ref 150–400)
RBC: 3.61 MIL/uL — ABNORMAL LOW (ref 3.87–5.11)
RDW: 11.7 % (ref 11.5–15.5)
WBC: 3.8 10*3/uL — ABNORMAL LOW (ref 4.0–10.5)
nRBC: 0 % (ref 0.0–0.2)

## 2019-09-15 LAB — COMPREHENSIVE METABOLIC PANEL
ALT: 13 U/L (ref 0–44)
AST: 18 U/L (ref 15–41)
Albumin: 3.9 g/dL (ref 3.5–5.0)
Alkaline Phosphatase: 40 U/L (ref 38–126)
Anion gap: 9 (ref 5–15)
BUN: 19 mg/dL (ref 8–23)
CO2: 25 mmol/L (ref 22–32)
Calcium: 8.7 mg/dL — ABNORMAL LOW (ref 8.9–10.3)
Chloride: 101 mmol/L (ref 98–111)
Creatinine, Ser: 0.77 mg/dL (ref 0.44–1.00)
GFR calc Af Amer: >60 mL/min (ref >60)
GFR calc non Af Amer: >60 mL/min (ref >60)
Glucose, Bld: 99 mg/dL (ref 70–99)
Potassium: 4 mmol/L (ref 3.5–5.1)
Sodium: 135 mmol/L (ref 135–145)
Total Bilirubin: 0.3 mg/dL (ref 0.3–1.2)
Total Protein: 6.8 g/dL (ref 6.5–8.1)

## 2019-09-15 MED ORDER — ESTRING 2 MG VA RING: 2.0000 mg | VAGINAL_RING | VAGINAL | 12 refills | Status: DC

## 2019-09-15 MED ORDER — SODIUM CHLORIDE 0.9 % IV SOLN
Freq: Once | INTRAVENOUS | Status: AC
  Filled 2019-09-15: qty 250

## 2019-09-15 MED ORDER — TAMOXIFEN CITRATE 20 MG PO TABS: 20.0000 mg | ORAL_TABLET | Freq: Every day | ORAL | 4 refills | Status: AC

## 2019-09-15 MED ORDER — ACETAMINOPHEN 325 MG PO TABS
650.0000 mg | ORAL_TABLET | Freq: Once | ORAL | Status: AC
  Administered 2019-09-15: 650 mg via ORAL

## 2019-09-15 MED ORDER — DIPHENHYDRAMINE HCL 25 MG PO CAPS
ORAL_CAPSULE | ORAL | Status: AC
  Filled 2019-09-15: qty 1

## 2019-09-15 MED ORDER — TRASTUZUMAB-DKST CHEMO 150 MG IV SOLR
450.0000 mg | Freq: Once | INTRAVENOUS | Status: AC
  Administered 2019-09-15: 14:00:00 450 mg via INTRAVENOUS
  Filled 2019-09-15: qty 21.43

## 2019-09-15 MED ORDER — ROSUVASTATIN CALCIUM 5 MG PO TABS: 5.0000 mg | ORAL_TABLET | Freq: Every day | ORAL | 12 refills | Status: DC

## 2019-09-15 MED ORDER — ESTRADIOL 0.1 MG/GM VA CREA: 1.0000 | TOPICAL_CREAM | Freq: Every day | VAGINAL | 12 refills | Status: DC

## 2019-09-15 MED ORDER — DIPHENHYDRAMINE HCL 25 MG PO CAPS
25.0000 mg | ORAL_CAPSULE | Freq: Once | ORAL | Status: AC
  Administered 2019-09-15: 25 mg via ORAL

## 2019-09-15 MED ORDER — SODIUM CHLORIDE 0.9% FLUSH
10.0000 mL | INTRAVENOUS | Status: DC | PRN
  Administered 2019-09-15: 10 mL
  Filled 2019-09-15: qty 10

## 2019-09-15 MED ORDER — HEPARIN SOD (PORK) LOCK FLUSH 100 UNIT/ML IV SOLN
500.0000 [IU] | Freq: Once | INTRAVENOUS | Status: AC | PRN
  Administered 2019-09-15: 14:00:00 500 [IU]
  Filled 2019-09-15: qty 5

## 2019-09-15 MED ORDER — ACETAMINOPHEN 325 MG PO TABS
ORAL_TABLET | ORAL | Status: AC
  Filled 2019-09-15: qty 2

## 2019-09-15 NOTE — Patient Instructions (Signed)
Cancer Center Discharge Instructions for Patients Receiving Chemotherapy  Today you received the following chemotherapy agents [id number]   To help prevent nausea and vomiting after your treatment, we encourage you to take your nausea medication as directed.   If you develop nausea and vomiting that is not controlled by your nausea medication, call the clinic.   BELOW ARE SYMPTOMS THAT SHOULD BE REPORTED IMMEDIATELY:  *FEVER GREATER THAN 100.5 F  *CHILLS WITH OR WITHOUT FEVER  NAUSEA AND VOMITING THAT IS NOT CONTROLLED WITH YOUR NAUSEA MEDICATION  *UNUSUAL SHORTNESS OF BREATH  *UNUSUAL BRUISING OR BLEEDING  TENDERNESS IN MOUTH AND THROAT WITH OR WITHOUT PRESENCE OF ULCERS  *URINARY PROBLEMS  *BOWEL PROBLEMS  UNUSUAL RASH Items with * indicate a potential emergency and should be followed up as soon as possible.  Feel free to call the clinic should you have any questions or concerns. The clinic phone number is [phone].  Please show the CHEMO ALERT CARD at check-in to the Emergency Department and triage nurse.   

## 2019-09-17 ENCOUNTER — Other Ambulatory Visit: Payer: Self-pay

## 2019-09-17 ENCOUNTER — Encounter: Payer: Self-pay | Admitting: Radiation Oncology

## 2019-09-17 ENCOUNTER — Ambulatory Visit
Admission: RE | Admit: 2019-09-17 | Discharge: 2019-09-17 | Disposition: A | Discharge status: Home / Self Care | Payer: Managed Care, Other (non HMO) | Source: Ambulatory Visit | Attending: Radiation Oncology | Admitting: Radiation Oncology

## 2019-09-17 VITALS — BP 132/83 | HR 68 | Temp 98.2°F | Resp 20 | Wt 161.6 lb

## 2019-09-17 DIAGNOSIS — Z923 Personal history of irradiation: Secondary | ICD-10-CM | POA: Diagnosis not present

## 2019-09-17 DIAGNOSIS — Z79899 Other long term (current) drug therapy: Secondary | ICD-10-CM | POA: Diagnosis not present

## 2019-09-17 DIAGNOSIS — Z79810 Long term (current) use of selective estrogen receptor modulators (SERMs): Secondary | ICD-10-CM | POA: Diagnosis not present

## 2019-09-17 DIAGNOSIS — R5383 Other fatigue: Secondary | ICD-10-CM | POA: Insufficient documentation

## 2019-09-17 DIAGNOSIS — C50512 Malignant neoplasm of lower-outer quadrant of left female breast: Secondary | ICD-10-CM | POA: Diagnosis not present

## 2019-09-17 DIAGNOSIS — Z170 Estrogen receptor positive status [ER+]: Secondary | ICD-10-CM | POA: Diagnosis not present

## 2019-09-17 NOTE — Progress Notes (Signed)
Pt presents today for f/u with Dr. Sondra Come. Pt reports fatigue has improved, although states fatigue was never bothersome. Pt reports breast is tender to touch. Pt is not using any moisturizer on breast. Breast/skin has returned to WNL with faint hyperpigmentation noted on nipple area.   BP 132/83 (BP Location: Right Arm, Patient Position: Sitting, Cuff Size: Normal)   Pulse 68   Temp 98.2 F (36.8 C)   Resp 20   Wt 161 lb 9.6 oz (73.3 kg)   SpO2 99%   BMI 25.31 kg/m   Wt Readings from Last 3 Encounters:  09/17/19 161 lb 9.6 oz (73.3 kg)  09/15/19 160 lb 9.6 oz (72.8 kg)  09/10/19 157 lb (71.2 kg)   Loma Sousa, RN BSN

## 2019-09-17 NOTE — Progress Notes (Signed)
Radiation Oncology         [phone] ________________________________  Name: Allison Hanna MRN: [id number]  Date: 09/17/2019  DOB: 07-23-1961  Follow-Up Visit Note  CC: Merrilee Seashore, MD  Merrilee Seashore, MD    ICD-10-CM   1. Malignant neoplasm of lower-outer quadrant of left breast of female, estrogen receptor positive ([place])  C50.512    Z17.0     Diagnosis:   StageIA(pT1c, pN0)LeftBreast LOQ,Invasive DuctalCarcinoma, ER+/ PR-/ Her2+, Grade2  Interval Since Last Radiation:  1 month   07/15/2019 through 08/13/2019 Site Technique Total Dose (Gy) Dose per Fx (Gy) Completed Fx Beam Energies  Breast, Left: Breast_Lt 3D 40.05/40.05 2.67 15/15 6X  Breast, Left: Breast_Lt_Bst 3D 12/12 2 6/6 6X, 10X    Narrative:  The patient returns today for routine follow-up. She recently followed up with Dr. Jana Hakim on 09/15/2019 and was started on tamoxifen. She will be due for repeat mammogram in March 2021.  On review of systems, she reports some fatigue but continues with her usual activities.  She has some mild tenderness in the breas but no constant pain.  No nipple discharge or bleeding  ALLERGIES:  has No Known Allergies.  Meds: Current Outpatient Medications  Medication Sig Dispense Refill  . B Complex-Folic Acid (SUPER B COMPLEX MAXI) TABS Take by mouth.    . calcium carbonate (OS-CAL) 600 MG TABS tablet Take 1,200 mg by mouth 2 (two) times daily with a meal.    . Cholecalciferol (VITAMIN D3) 25 MCG (1000 UT) CAPS Take 2,000 Units by mouth daily.    Marland Kitchen estradiol (ESTRACE VAGINAL) 0.1 MG/GM vaginal cream Place 1 Applicatorful vaginally at bedtime. 42.5 g 12  . estradiol (ESTRING) 2 MG vaginal ring Place 2 mg vaginally every 3 (three) months. follow package directions 1 each 12  . Levothyroxine Sodium (SYNTHROID PO) Take 75 mcg by mouth daily.     . Magnesium 250 MG TABS Take 250 mg by mouth daily.    . nitroGLYCERIN (NITRODUR - DOSED IN MG/24 HR) 0.2 mg/hr patch  USE 1/4 PATCH DAILY TO THE AFFECTED AREA. 30 patch 1  . rosuvastatin (CRESTOR) 5 MG tablet Take 1 tablet (5 mg total) by mouth daily. 30 tablet 12  . vitamin C (ASCORBIC ACID) 500 MG tablet Take 500 mg by mouth daily.    . tamoxifen (NOLVADEX) 20 MG tablet Take 1 tablet (20 mg total) by mouth daily. (Patient not taking: Reported on 09/17/2019) 90 tablet 4   No current facility-administered medications for this encounter.   Facility-Administered Medications Ordered in Other Encounters  Medication Dose Route Frequency Provider Last Rate Last Admin  . sodium chloride flush (NS) 0.9 % injection 10 mL  10 mL Intracatheter PRN Magrinat, Virgie Dad, MD        Physical Findings: The patient is in no acute distress. Patient is alert and oriented.  weight is 161 lb 9.6 oz (73.3 kg). Her temperature is 98.2 F (36.8 C). Her blood pressure is 132/83 and her pulse is 68. Her respiration is 20 and oxygen saturation is 99%. .  No significant changes. Lungs are clear to auscultation bilaterally. Heart has regular rate and rhythm. No palpable cervical, supraclavicular, or axillary adenopathy. Abdomen soft, non-tender, normal bowel sounds. Right Breast: no palpable mass, nipple discharge or bleeding. Left Breast: Skin has healed well.  Mild hyperpigmentation changes mild edema.  No dominant mass appreciated breast nipple discharge or bleeding  Lab Findings: Lab Results  Component Value Date   WBC 3.8 (  L) 09/15/2019   HGB 11.3 (L) 09/15/2019   HCT 33.7 (L) 09/15/2019   MCV 93.4 09/15/2019   PLT 250 09/15/2019    Radiographic Findings: No results found.  Impression:  The patient is recovering from the effects of radiation.  She overall tolerated the radiation therapy well.  Plan: As needed follow-up in radiation oncology.  Patient will follow up with medical oncology and will continue with trastuzumab.  She will also be starting tamoxifen in the near future.  ____________________________________ Gery Pray, MD   This document serves as a record of services personally performed by Gery Pray, MD. It was created on his behalf by Wilburn Mylar, a trained medical scribe. The creation of this record is based on the scribe's personal observations and the provider's statements to them. This document has been checked and approved by the attending provider.

## 2019-09-17 NOTE — Patient Instructions (Signed)
Coronavirus (COVID-19) Are you at risk?  Are you at risk for the Coronavirus (COVID-19)?  To be considered HIGH RISK for Coronavirus (COVID-19), you have to meet the following criteria:  . Traveled to [place], [place], [place], [place] or [place]; or in the [place] to [place], [place], [place], or [place]; and have fever, cough, and shortness of breath within the last 2 weeks of travel OR . Been in close contact with a person diagnosed with COVID-19 within the last 2 weeks and have fever, cough, and shortness of breath . IF YOU DO NOT MEET THESE CRITERIA, YOU ARE CONSIDERED LOW RISK FOR COVID-19.  What to do if you are HIGH RISK for COVID-19?  . If you are having a medical emergency, call 911. . Seek medical care right away. Before you go to a doctor's office, urgent care or emergency department, call ahead and tell them about your recent travel, contact with someone diagnosed with COVID-19, and your symptoms. You should receive instructions from your physician's office regarding next steps of care.  . When you arrive at healthcare provider, tell the healthcare staff immediately you have returned from visiting [place], [place], [place], [place] or [place]; or traveled in the [place] to [place], [place], [place], or [place]; in the last two weeks or you have been in close contact with a person diagnosed with COVID-19 in the last 2 weeks.   . Tell the health care staff about your symptoms: fever, cough and shortness of breath. . After you have been seen by a medical provider, you will be either: o Tested for (COVID-19) and discharged home on quarantine except to seek medical care if symptoms worsen, and asked to  - Stay home and avoid contact with others until you get your results (4-5 days)  - Avoid travel on public transportation if possible (such as bus, train, or airplane) or o Sent to the Emergency Department by EMS for evaluation, COVID-19 testing, and possible  admission depending on your condition and test results.  What to do if you are LOW RISK for COVID-19?  Reduce your risk of any infection by using the same precautions used for avoiding the common cold or flu:  . Wash your hands often with soap and warm water for at least 20 seconds.  If soap and water are not readily available, use an alcohol-based hand sanitizer with at least 60% alcohol.  . If coughing or sneezing, cover your mouth and nose by coughing or sneezing into the elbow areas of your shirt or coat, into a tissue or into your sleeve (not your hands). . Avoid shaking hands with others and consider head nods or verbal greetings only. . Avoid touching your eyes, nose, or mouth with unwashed hands.  . Avoid close contact with people who are sick. . Avoid places or events with large numbers of people in one location, like concerts or sporting events. . Carefully consider travel plans you have or are making. . If you are planning any travel outside or inside the [place], visit the CDC's Travelers' Health webpage for the latest health notices. . If you have some symptoms but not all symptoms, continue to monitor at home and seek medical attention if your symptoms worsen. . If you are having a medical emergency, call 911.   ADDITIONAL HEALTHCARE OPTIONS FOR PATIENTS  [facility] / e-Visit: [email or web address]         [facility]: [phone]  [facility]: [phone]                   [facility]: [phone]   

## 2019-09-21 ENCOUNTER — Telehealth: Payer: Self-pay | Admitting: Oncology

## 2019-09-21 ENCOUNTER — Encounter: Payer: Self-pay | Admitting: *Deleted

## 2019-09-21 NOTE — Telephone Encounter (Signed)
I talk with patient regarding schedule  

## 2019-09-24 ENCOUNTER — Encounter: Payer: Self-pay | Admitting: *Deleted

## 2019-10-03 ENCOUNTER — Other Ambulatory Visit: Payer: Self-pay | Admitting: Oncology

## 2019-10-06 ENCOUNTER — Other Ambulatory Visit: Payer: Self-pay

## 2019-10-06 ENCOUNTER — Inpatient Hospital Stay: Payer: Managed Care, Other (non HMO)

## 2019-10-06 ENCOUNTER — Inpatient Hospital Stay: Payer: Managed Care, Other (non HMO) | Attending: Oncology

## 2019-10-06 VITALS — BP 137/85 | HR 68 | Temp 98.0°F | Resp 17 | Wt 160.0 lb

## 2019-10-06 DIAGNOSIS — Z79899 Other long term (current) drug therapy: Secondary | ICD-10-CM | POA: Diagnosis not present

## 2019-10-06 DIAGNOSIS — Z95828 Presence of other vascular implants and grafts: Secondary | ICD-10-CM

## 2019-10-06 DIAGNOSIS — C50512 Malignant neoplasm of lower-outer quadrant of left female breast: Secondary | ICD-10-CM

## 2019-10-06 DIAGNOSIS — Z170 Estrogen receptor positive status [ER+]: Secondary | ICD-10-CM | POA: Diagnosis not present

## 2019-10-06 DIAGNOSIS — Z5112 Encounter for antineoplastic immunotherapy: Secondary | ICD-10-CM | POA: Insufficient documentation

## 2019-10-06 LAB — CBC WITH DIFFERENTIAL/PLATELET
Abs Immature Granulocytes: 0 10*3/uL (ref 0.00–0.07)
Basophils Absolute: 0.1 10*3/uL (ref 0.0–0.1)
Basophils Relative: 1 %
Eosinophils Absolute: 0.1 10*3/uL (ref 0.0–0.5)
Eosinophils Relative: 2 %
HCT: 34.6 % — ABNORMAL LOW (ref 36.0–46.0)
Hemoglobin: 11.6 g/dL — ABNORMAL LOW (ref 12.0–15.0)
Immature Granulocytes: 0 %
Lymphocytes Relative: 15 %
Lymphs Abs: 0.7 10*3/uL (ref 0.7–4.0)
MCH: 30.9 pg (ref 26.0–34.0)
MCHC: 33.5 g/dL (ref 30.0–36.0)
MCV: 92.3 fL (ref 80.0–100.0)
Monocytes Absolute: 0.4 10*3/uL (ref 0.1–1.0)
Monocytes Relative: 9 %
Neutro Abs: 3.2 10*3/uL (ref 1.7–7.7)
Neutrophils Relative %: 73 %
Platelets: 270 10*3/uL (ref 150–400)
RBC: 3.75 MIL/uL — ABNORMAL LOW (ref 3.87–5.11)
RDW: 11.6 % (ref 11.5–15.5)
WBC: 4.4 10*3/uL (ref 4.0–10.5)
nRBC: 0 % (ref 0.0–0.2)

## 2019-10-06 LAB — COMPREHENSIVE METABOLIC PANEL
ALT: 16 U/L (ref 0–44)
AST: 17 U/L (ref 15–41)
Albumin: 4.1 g/dL (ref 3.5–5.0)
Alkaline Phosphatase: 41 U/L (ref 38–126)
Anion gap: 8 (ref 5–15)
BUN: 15 mg/dL (ref 8–23)
CO2: 27 mmol/L (ref 22–32)
Calcium: 9 mg/dL (ref 8.9–10.3)
Chloride: 100 mmol/L (ref 98–111)
Creatinine, Ser: 0.72 mg/dL (ref 0.44–1.00)
GFR calc Af Amer: >60 mL/min (ref >60)
GFR calc non Af Amer: >60 mL/min (ref >60)
Glucose, Bld: 98 mg/dL (ref 70–99)
Potassium: 4.2 mmol/L (ref 3.5–5.1)
Sodium: 135 mmol/L (ref 135–145)
Total Bilirubin: 0.3 mg/dL (ref 0.3–1.2)
Total Protein: 7.2 g/dL (ref 6.5–8.1)

## 2019-10-06 MED ORDER — DIPHENHYDRAMINE HCL 25 MG PO CAPS
25.0000 mg | ORAL_CAPSULE | Freq: Once | ORAL | Status: AC
  Administered 2019-10-06: 25 mg via ORAL

## 2019-10-06 MED ORDER — HEPARIN SOD (PORK) LOCK FLUSH 100 UNIT/ML IV SOLN
500.0000 [IU] | Freq: Once | INTRAVENOUS | Status: AC | PRN
  Administered 2019-10-06: 500 [IU]
  Filled 2019-10-06: qty 5

## 2019-10-06 MED ORDER — ACETAMINOPHEN 325 MG PO TABS
650.0000 mg | ORAL_TABLET | Freq: Once | ORAL | Status: AC
  Administered 2019-10-06: 650 mg via ORAL

## 2019-10-06 MED ORDER — TRASTUZUMAB-DKST CHEMO 150 MG IV SOLR
450.0000 mg | Freq: Once | INTRAVENOUS | Status: AC
  Administered 2019-10-06: 450 mg via INTRAVENOUS
  Filled 2019-10-06: qty 21.43

## 2019-10-06 MED ORDER — ACETAMINOPHEN 325 MG PO TABS
ORAL_TABLET | ORAL | Status: AC
  Filled 2019-10-06: qty 2

## 2019-10-06 MED ORDER — SODIUM CHLORIDE 0.9% FLUSH
10.0000 mL | INTRAVENOUS | Status: DC | PRN
  Administered 2019-10-06: 13:00:00 10 mL
  Filled 2019-10-06: qty 10

## 2019-10-06 MED ORDER — DIPHENHYDRAMINE HCL 25 MG PO CAPS
ORAL_CAPSULE | ORAL | Status: AC
  Filled 2019-10-06: qty 1

## 2019-10-06 MED ORDER — SODIUM CHLORIDE 0.9 % IV SOLN
Freq: Once | INTRAVENOUS | Status: AC
  Filled 2019-10-06: qty 250

## 2019-10-06 MED ORDER — SODIUM CHLORIDE 0.9% FLUSH
10.0000 mL | INTRAVENOUS | Status: DC | PRN
  Administered 2019-10-06: 10 mL
  Filled 2019-10-06: qty 10

## 2019-10-22 ENCOUNTER — Other Ambulatory Visit: Payer: Self-pay

## 2019-10-22 ENCOUNTER — Ambulatory Visit (INDEPENDENT_AMBULATORY_CARE_PROVIDER_SITE_OTHER): Payer: Managed Care, Other (non HMO) | Admitting: Sports Medicine

## 2019-10-22 DIAGNOSIS — M25511 Pain in right shoulder: Secondary | ICD-10-CM | POA: Diagnosis not present

## 2019-10-22 NOTE — Progress Notes (Signed)
   Subjective:    Patient ID: RACQUELL FREEBURN, female    DOB: [birth year], 59 y.o.   MRN: [id number]  HPI  Patient comes in today for follow-up on a partial thickness right shoulder rotator cuff tear.  She continues to improve.  Overall she is about 80% improved after 3 months of topical nitroglycerin and home exercises.  She does still note some limited range of motion particularly with abduction and external rotation.  She has been trying to do some stretching which has been minimally helpful.  She is tolerating her nitroglycerin patches without difficulty.   Review of Systems    As above Objective:   Physical Exam  Well-developed, well-nourished.  No acute distress.  Right shoulder: Full range of motion.  No tenderness to palpation.  Her rotator cuff strength remains 5/5 with some mild pain with resisted internal rotation.  Minimally positive empty can.  Neurovascular intact distally.      Assessment & Plan:   Partial-thickness rotator cuff tear, right shoulder  Patient continues to improve with topical nitroglycerin and home exercises.  However, I think she would benefit from some limited formal physical therapy.  I will set this up at [place] with Angelia Mould and the patient will follow up with me again in 6 weeks for reevaluation.  She enjoys playing tennis so I have asked her to hit a few easy groundstrokes as well as some overhead strokes but to refrain from serving until follow-up.  If she begins to experience increasing pain as she eases back into tennis then we may need to consider merits of further diagnostic imaging.

## 2019-10-27 ENCOUNTER — Inpatient Hospital Stay: Payer: Managed Care, Other (non HMO)

## 2019-10-27 ENCOUNTER — Other Ambulatory Visit: Payer: Self-pay

## 2019-10-27 ENCOUNTER — Inpatient Hospital Stay: Payer: Managed Care, Other (non HMO) | Attending: Oncology

## 2019-10-27 VITALS — BP 124/74 | HR 69 | Temp 97.1°F | Resp 18 | Wt 160.8 lb

## 2019-10-27 DIAGNOSIS — Z5112 Encounter for antineoplastic immunotherapy: Secondary | ICD-10-CM | POA: Insufficient documentation

## 2019-10-27 DIAGNOSIS — C50512 Malignant neoplasm of lower-outer quadrant of left female breast: Secondary | ICD-10-CM | POA: Insufficient documentation

## 2019-10-27 DIAGNOSIS — Z79899 Other long term (current) drug therapy: Secondary | ICD-10-CM | POA: Insufficient documentation

## 2019-10-27 DIAGNOSIS — Z95828 Presence of other vascular implants and grafts: Secondary | ICD-10-CM

## 2019-10-27 DIAGNOSIS — Z170 Estrogen receptor positive status [ER+]: Secondary | ICD-10-CM | POA: Insufficient documentation

## 2019-10-27 LAB — CBC WITH DIFFERENTIAL/PLATELET
Abs Immature Granulocytes: 0.02 10*3/uL (ref 0.00–0.07)
Basophils Absolute: 0.1 10*3/uL (ref 0.0–0.1)
Basophils Relative: 1 %
Eosinophils Absolute: 0.1 10*3/uL (ref 0.0–0.5)
Eosinophils Relative: 1 %
HCT: 36.1 % (ref 36.0–46.0)
Hemoglobin: 12 g/dL (ref 12.0–15.0)
Immature Granulocytes: 0 %
Lymphocytes Relative: 13 %
Lymphs Abs: 0.8 10*3/uL (ref 0.7–4.0)
MCH: 30.6 pg (ref 26.0–34.0)
MCHC: 33.2 g/dL (ref 30.0–36.0)
MCV: 92.1 fL (ref 80.0–100.0)
Monocytes Absolute: 0.5 10*3/uL (ref 0.1–1.0)
Monocytes Relative: 8 %
Neutro Abs: 4.5 10*3/uL (ref 1.7–7.7)
Neutrophils Relative %: 77 %
Platelets: 267 10*3/uL (ref 150–400)
RBC: 3.92 MIL/uL (ref 3.87–5.11)
RDW: 11.9 % (ref 11.5–15.5)
WBC: 5.9 10*3/uL (ref 4.0–10.5)
nRBC: 0 % (ref 0.0–0.2)

## 2019-10-27 LAB — COMPREHENSIVE METABOLIC PANEL
ALT: 15 U/L (ref 0–44)
AST: 18 U/L (ref 15–41)
Albumin: 4.1 g/dL (ref 3.5–5.0)
Alkaline Phosphatase: 41 U/L (ref 38–126)
Anion gap: 12 (ref 5–15)
BUN: 21 mg/dL (ref 8–23)
CO2: 25 mmol/L (ref 22–32)
Calcium: 8.9 mg/dL (ref 8.9–10.3)
Chloride: 100 mmol/L (ref 98–111)
Creatinine, Ser: 0.79 mg/dL (ref 0.44–1.00)
GFR calc Af Amer: >60 mL/min (ref >60)
GFR calc non Af Amer: >60 mL/min (ref >60)
Glucose, Bld: 102 mg/dL — ABNORMAL HIGH (ref 70–99)
Potassium: 4.3 mmol/L (ref 3.5–5.1)
Sodium: 137 mmol/L (ref 135–145)
Total Bilirubin: 0.4 mg/dL (ref 0.3–1.2)
Total Protein: 7.3 g/dL (ref 6.5–8.1)

## 2019-10-27 MED ORDER — TRASTUZUMAB-DKST CHEMO 150 MG IV SOLR
450.0000 mg | Freq: Once | INTRAVENOUS | Status: AC
  Administered 2019-10-27: 450 mg via INTRAVENOUS
  Filled 2019-10-27: qty 21.43

## 2019-10-27 MED ORDER — DIPHENHYDRAMINE HCL 25 MG PO CAPS
25.0000 mg | ORAL_CAPSULE | Freq: Once | ORAL | Status: AC
  Administered 2019-10-27: 14:00:00 25 mg via ORAL

## 2019-10-27 MED ORDER — ACETAMINOPHEN 325 MG PO TABS
650.0000 mg | ORAL_TABLET | Freq: Once | ORAL | Status: AC
  Administered 2019-10-27: 650 mg via ORAL

## 2019-10-27 MED ORDER — ACETAMINOPHEN 325 MG PO TABS
ORAL_TABLET | ORAL | Status: AC
  Filled 2019-10-27: qty 2

## 2019-10-27 MED ORDER — SODIUM CHLORIDE 0.9% FLUSH
10.0000 mL | INTRAVENOUS | Status: DC | PRN
  Administered 2019-10-27: 10 mL
  Filled 2019-10-27: qty 10

## 2019-10-27 MED ORDER — SODIUM CHLORIDE 0.9 % IV SOLN
Freq: Once | INTRAVENOUS | Status: AC
  Filled 2019-10-27: qty 250

## 2019-10-27 MED ORDER — DIPHENHYDRAMINE HCL 25 MG PO CAPS
ORAL_CAPSULE | ORAL | Status: AC
  Filled 2019-10-27: qty 1

## 2019-10-27 MED ORDER — HEPARIN SOD (PORK) LOCK FLUSH 100 UNIT/ML IV SOLN
500.0000 [IU] | Freq: Once | INTRAVENOUS | Status: AC | PRN
  Administered 2019-10-27: 500 [IU]
  Filled 2019-10-27: qty 5

## 2019-10-27 NOTE — Patient Instructions (Signed)
[facility] °Discharge Instructions for Patients Receiving Chemotherapy ° °Today you received the following chemotherapy agents Trastuzumab ° °To help prevent nausea and vomiting after your treatment, we encourage you to take your nausea medication as directed. °  °If you develop nausea and vomiting that is not controlled by your nausea medication, call the clinic.  ° °BELOW ARE SYMPTOMS THAT SHOULD BE REPORTED IMMEDIATELY: °· *FEVER GREATER THAN 100.5 F °· *CHILLS WITH OR WITHOUT FEVER °· NAUSEA AND VOMITING THAT IS NOT CONTROLLED WITH YOUR NAUSEA MEDICATION °· *UNUSUAL SHORTNESS OF BREATH °· *UNUSUAL BRUISING OR BLEEDING °· TENDERNESS IN MOUTH AND THROAT WITH OR WITHOUT PRESENCE OF ULCERS °· *URINARY PROBLEMS °· *BOWEL PROBLEMS °· UNUSUAL RASH °Items with * indicate a potential emergency and should be followed up as soon as possible. ° °Feel free to call the clinic should you have any questions or concerns. The clinic phone number is [phone]. ° °Please show the CHEMO ALERT CARD at check-in to the Emergency Department and triage nurse. ° ° °

## 2019-11-06 ENCOUNTER — Encounter: Payer: Self-pay | Admitting: *Deleted

## 2019-11-06 ENCOUNTER — Other Ambulatory Visit: Payer: Self-pay | Admitting: *Deleted

## 2019-11-06 DIAGNOSIS — C50512 Malignant neoplasm of lower-outer quadrant of left female breast: Secondary | ICD-10-CM

## 2019-11-11 NOTE — Progress Notes (Signed)
Pharmacist Chemotherapy Monitoring - Follow Up Assessment    I verify that I have reviewed each item in the below checklist:  . Regimen for the patient is scheduled for the appropriate day and plan matches scheduled date. Marland Kitchen Appropriate non-routine labs are ordered dependent on drug ordered. . If applicable, additional medications reviewed and ordered per protocol based on lifetime cumulative doses and/or treatment regimen.   Plan for follow-up and/or issues identified: No . I-vent associated with next due treatment: No . MD and/or nursing notified: No  Allison Hanna 11/11/2019 12:34 PM

## 2019-11-16 ENCOUNTER — Encounter: Payer: Self-pay | Admitting: *Deleted

## 2019-11-17 ENCOUNTER — Inpatient Hospital Stay: Payer: Managed Care, Other (non HMO)

## 2019-11-17 ENCOUNTER — Inpatient Hospital Stay (HOSPITAL_BASED_OUTPATIENT_CLINIC_OR_DEPARTMENT_OTHER): Payer: Managed Care, Other (non HMO) | Admitting: Adult Health

## 2019-11-17 ENCOUNTER — Other Ambulatory Visit: Payer: Self-pay

## 2019-11-17 ENCOUNTER — Other Ambulatory Visit: Payer: Self-pay | Admitting: Oncology

## 2019-11-17 ENCOUNTER — Telehealth: Payer: Self-pay | Admitting: *Deleted

## 2019-11-17 ENCOUNTER — Encounter: Payer: Self-pay | Admitting: Adult Health

## 2019-11-17 VITALS — BP 136/78 | HR 71 | Temp 97.6°F | Resp 18 | Ht 67.0 in | Wt 160.7 lb

## 2019-11-17 DIAGNOSIS — C50512 Malignant neoplasm of lower-outer quadrant of left female breast: Secondary | ICD-10-CM

## 2019-11-17 DIAGNOSIS — Z170 Estrogen receptor positive status [ER+]: Secondary | ICD-10-CM

## 2019-11-17 DIAGNOSIS — Z95828 Presence of other vascular implants and grafts: Secondary | ICD-10-CM

## 2019-11-17 DIAGNOSIS — Z5112 Encounter for antineoplastic immunotherapy: Secondary | ICD-10-CM | POA: Diagnosis not present

## 2019-11-17 LAB — COMPREHENSIVE METABOLIC PANEL
ALT: 14 U/L (ref 0–44)
AST: 16 U/L (ref 15–41)
Albumin: 3.8 g/dL (ref 3.5–5.0)
Alkaline Phosphatase: 37 U/L — ABNORMAL LOW (ref 38–126)
Anion gap: 8 (ref 5–15)
BUN: 20 mg/dL (ref 8–23)
CO2: 27 mmol/L (ref 22–32)
Calcium: 8.9 mg/dL (ref 8.9–10.3)
Chloride: 100 mmol/L (ref 98–111)
Creatinine, Ser: 0.85 mg/dL (ref 0.44–1.00)
GFR calc Af Amer: >60 mL/min (ref >60)
GFR calc non Af Amer: >60 mL/min (ref >60)
Glucose, Bld: 100 mg/dL — ABNORMAL HIGH (ref 70–99)
Potassium: 4.2 mmol/L (ref 3.5–5.1)
Sodium: 135 mmol/L (ref 135–145)
Total Bilirubin: 0.3 mg/dL (ref 0.3–1.2)
Total Protein: 6.8 g/dL (ref 6.5–8.1)

## 2019-11-17 LAB — CBC WITH DIFFERENTIAL/PLATELET
Abs Immature Granulocytes: 0.01 10*3/uL (ref 0.00–0.07)
Basophils Absolute: 0.1 10*3/uL (ref 0.0–0.1)
Basophils Relative: 1 %
Eosinophils Absolute: 0.1 10*3/uL (ref 0.0–0.5)
Eosinophils Relative: 2 %
HCT: 34 % — ABNORMAL LOW (ref 36.0–46.0)
Hemoglobin: 11.5 g/dL — ABNORMAL LOW (ref 12.0–15.0)
Immature Granulocytes: 0 %
Lymphocytes Relative: 16 %
Lymphs Abs: 0.7 10*3/uL (ref 0.7–4.0)
MCH: 30.7 pg (ref 26.0–34.0)
MCHC: 33.8 g/dL (ref 30.0–36.0)
MCV: 90.9 fL (ref 80.0–100.0)
Monocytes Absolute: 0.4 10*3/uL (ref 0.1–1.0)
Monocytes Relative: 9 %
Neutro Abs: 3.1 10*3/uL (ref 1.7–7.7)
Neutrophils Relative %: 72 %
Platelets: 234 10*3/uL (ref 150–400)
RBC: 3.74 MIL/uL — ABNORMAL LOW (ref 3.87–5.11)
RDW: 12.4 % (ref 11.5–15.5)
WBC: 4.3 10*3/uL (ref 4.0–10.5)
nRBC: 0 % (ref 0.0–0.2)

## 2019-11-17 MED ORDER — HEPARIN SOD (PORK) LOCK FLUSH 100 UNIT/ML IV SOLN
500.0000 [IU] | Freq: Once | INTRAVENOUS | Status: AC | PRN
  Administered 2019-11-17: 500 [IU]
  Filled 2019-11-17: qty 5

## 2019-11-17 MED ORDER — SODIUM CHLORIDE 0.9% FLUSH
10.0000 mL | INTRAVENOUS | Status: DC | PRN
  Administered 2019-11-17: 10 mL
  Filled 2019-11-17: qty 10

## 2019-11-17 MED ORDER — TRASTUZUMAB-DKST CHEMO 150 MG IV SOLR
450.0000 mg | Freq: Once | INTRAVENOUS | Status: AC
  Administered 2019-11-17: 450 mg via INTRAVENOUS
  Filled 2019-11-17: qty 21.43

## 2019-11-17 MED ORDER — DIPHENHYDRAMINE HCL 25 MG PO CAPS
ORAL_CAPSULE | ORAL | Status: AC
  Filled 2019-11-17: qty 1

## 2019-11-17 MED ORDER — ACETAMINOPHEN 325 MG PO TABS
ORAL_TABLET | ORAL | Status: AC
  Filled 2019-11-17: qty 2

## 2019-11-17 MED ORDER — ACETAMINOPHEN 325 MG PO TABS
650.0000 mg | ORAL_TABLET | Freq: Once | ORAL | Status: AC
  Administered 2019-11-17: 650 mg via ORAL

## 2019-11-17 MED ORDER — SODIUM CHLORIDE 0.9 % IV SOLN
Freq: Once | INTRAVENOUS | Status: AC
  Filled 2019-11-17: qty 250

## 2019-11-17 MED ORDER — DIPHENHYDRAMINE HCL 25 MG PO CAPS
25.0000 mg | ORAL_CAPSULE | Freq: Once | ORAL | Status: AC
  Administered 2019-11-17: 25 mg via ORAL

## 2019-11-17 NOTE — Telephone Encounter (Signed)
Pt left vm to schedule appt with Dr. Jana Hakim to assess lump noted in right breast- currently being treated for left breast cancer. Called pt and noted appt with Mendel Ryder NP after labs. Pt noted that she also has noticed a rash that it "itchy" that started on her back and is now moving to her chest. Pt notes she has not changed any soap, lotion or laundry detergent. Pt relate only thing that is new is she started Tamoxifen 1 month ago. Informed pt to show Lindsey rash during breast exam. Pt notes she has a hx of shingles but these symptoms are different. Msg to [facility] regarding rash.

## 2019-11-17 NOTE — Progress Notes (Signed)
Per Dr. Jana Hakim, ok to treat with ECHO from 08/07/19. Patient to be scheduled for new ECHO later this month.

## 2019-11-17 NOTE — Progress Notes (Signed)
I have entered an order for echocardiography a week from now

## 2019-11-17 NOTE — Patient Instructions (Signed)
[facility] °Discharge Instructions for Patients Receiving Chemotherapy ° °Today you received the following chemotherapy agents Trastuzumab ° °To help prevent nausea and vomiting after your treatment, we encourage you to take your nausea medication as directed. °  °If you develop nausea and vomiting that is not controlled by your nausea medication, call the clinic.  ° °BELOW ARE SYMPTOMS THAT SHOULD BE REPORTED IMMEDIATELY: °· *FEVER GREATER THAN 100.5 F °· *CHILLS WITH OR WITHOUT FEVER °· NAUSEA AND VOMITING THAT IS NOT CONTROLLED WITH YOUR NAUSEA MEDICATION °· *UNUSUAL SHORTNESS OF BREATH °· *UNUSUAL BRUISING OR BLEEDING °· TENDERNESS IN MOUTH AND THROAT WITH OR WITHOUT PRESENCE OF ULCERS °· *URINARY PROBLEMS °· *BOWEL PROBLEMS °· UNUSUAL RASH °Items with * indicate a potential emergency and should be followed up as soon as possible. ° °Feel free to call the clinic should you have any questions or concerns. The clinic phone number is [phone]. ° °Please show the CHEMO ALERT CARD at check-in to the Emergency Department and triage nurse. ° ° °

## 2019-11-17 NOTE — Progress Notes (Signed)
[facility]  [phone] [phone]) [phone]     ID: Allison Hanna DOB: October 13, 1960  MR#: [id number]  EZM#:[id number]  Patient Care Team: Merrilee Seashore, MD as PCP - General (Internal Medicine) Mauro Kaufmann, RN as Oncology Nurse Navigator Rockwell Germany, RN as Oncology Nurse Navigator Magrinat, Virgie Dad, MD as Consulting Physician (Oncology) Gery Pray, MD as Consulting Physician (Radiation Oncology) Fanny Skates, MD as Consulting Physician (General Surgery) Brien Few, MD as Consulting Physician (Obstetrics and Gynecology) Larey Dresser, MD as Consulting Physician (Cardiology) Feliberto Harts R (Inactive) Felipa Furnace, DPM as Consulting Physician (Podiatry) Scot Dock, NP OTHER MD:  CHIEF COMPLAINT: Estrogen receptor positive breast cancer  CURRENT TREATMENT: trastuzumab, tamoxifen   INTERVAL HISTORY: Allison Hanna returns today for follow-up of her estrogen receptor positive breast cancer.   She continues on adjuvant trastuzumab ([place]).    She was started on Tamoxifen one month ago.    Allison Hanna has a new breast area of concern in her right breast.  She doesn't recall seeing this and noted the change over the weekend.  Her next mammogram is due in 01/2020.    Allison Hanna also notes having a new rash on her back on her back about 1.5 weeks ago.  She notes these areas were itching, worse at night.  No family members in her home with rash.  She denies any new detergents, foods, creams, lotions.    She had her first covid vaccine on 10/30/2019 (3 weeks ago).     REVIEW OF SYSTEMS: Allison Hanna notes some mild hot flashes that are manageable.  She has some vaginal wetness which she says is a good thing.  She is using Estrace cream 2-3 times per week and notes that her vaginal dryness and atrophy is much improved.  Otherwise, Allison Hanna is doing well and a detailed ROS was non contributory.     HISTORY OF CURRENT ILLNESS: From the original  intake note:  "Allison Hanna" presented with a palpable left breast mass. She initially experienced soreness to her left breast around April 2020 and tried to get a mammogram at this point, but due to pandemic concerns, the scan was delayed.  She did not actually feel a mass in the breast until June 1.  She underwent bilateral diagnostic mammography with tomography and left breast ultrasonography at [facility] on 02/11/2019 showing: breast density category B; irregular hyperechoic mass in the left breast at 4 o'clock 5 cm from the nipple measuring 8 mm; no enlarged adenopathy in the left axilla.  Accordingly on 02/12/2019 she proceeded to biopsy of the left breast area in question. The pathology from this procedure ([id number]) showed: invasive mammary carcinoma, e-cadherin positive. Prognostic indicators significant for: estrogen receptor, 100% positive with strong staining intensity and progesterone receptor, 0% negative. Proliferation marker Ki67 at 25%. HER2 positive by immunohistochemistry, (3+).  The patient's subsequent history is as detailed below.   PAST MEDICAL HISTORY: Past Medical History:  Diagnosis Date  . Anxiety   . Family history of breast cancer   . Family history of melanoma   . High cholesterol   . Hypothyroid   . Paralyzed vocal cords 2014  . Raynauds syndrome    cold weather triggers fingers to turn white  History of palpitations (resolved)   PAST SURGICAL HISTORY: Past Surgical History:  Procedure Laterality Date  . BREAST LUMPECTOMY WITH RADIOACTIVE SEED AND SENTINEL LYMPH NODE BIOPSY Left 03/05/2019   Procedure: LEFT BREAST LUMPECTOMY WITH RADIOACTIVE SEED AND LEFT AXILLARY  DEEP SENTINEL LYMPH NODE BIOPSY INJECT BLUE DYE LEFT BREAST;  Surgeon: Fanny Skates, MD;  Location: [facility];  Service: General;  Laterality: Left;  . KNEE SURGERY Bilateral    arthroscopy for menicus tears  . PORTACATH PLACEMENT Right 03/05/2019   Procedure: INSERTION  PORT-A-CATH;  Surgeon: Fanny Skates, MD;  Location: [facility];  Service: General;  Laterality: Right;  . WRIST SURGERY      FAMILY HISTORY: Family History  Problem Relation Age of Onset  . Breast cancer Mother 104  . Cervical cancer Mother   . Heart attack Father   . Skin cancer Brother   . Breast cancer Paternal Aunt        dx over 82s  . Breast cancer Paternal Aunt        dx over 25  . Uterine cancer Paternal Aunt 18  . Cervical cancer Maternal Aunt   . Liver cancer Paternal Uncle 55  . Stroke Maternal Grandmother   . Parkinson's disease Paternal Grandfather   . Breast cancer Cousin 100       mother's maternal first cousin  . Melanoma Cousin 18       pat first cousin  Patient's father was 54 years old when he died from heart attack. Patient's mother died at age 72. She was diagnosed with breast cancer at age 51. Two paternal aunts were diagnosed with breast cancer at older ages, one of which also had uterine cancer. She has 1 brother. He has a history of skin cancer.    GYNECOLOGIC HISTORY:  No LMP recorded. Patient is postmenopausal. Menarche: 59 years old Age at first live birth: 59 years old [place] P 3 LMP 2007 Contraceptive: yes, pill for about 8 years, no complications HRT no  Hysterectomy? no BSO? 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 She is married. [name] is in charge of operations for [employer]. She lives at home with her husband. Daughter Allison Hanna, age 33, lives in [place], [place] as a Research scientist (life sciences). Daughter Allison Hanna, age 55, lives in [place] as a Careers adviser. Son Allison Hanna, age 61, lives in [place] as a Ship broker and works part-time in [employer]. She has no grandchildren.     ADVANCED DIRECTIVES: [name] is automatically her HCPOA.   HEALTH MAINTENANCE: Social History   Tobacco Use  . Smoking status: Never Smoker  .  Smokeless tobacco: Never Used  Substance Use Topics  . Alcohol use: Yes    Alcohol/week: 2.0 standard drinks    Types: 2 Standard drinks or equivalent per week  . Drug use: Never     Colonoscopy: 2010  PAP: 08/2018  Bone density: 2019, osteopenia   No Known Allergies  Current Outpatient Medications  Medication Sig Dispense Refill  . B Complex-Folic Acid (SUPER B COMPLEX MAXI) TABS Take by mouth.    . calcium carbonate (OS-CAL) 600 MG TABS tablet Take 1,200 mg by mouth 2 (two) times daily with a meal.    . Cholecalciferol (VITAMIN D3) 25 MCG (1000 UT) CAPS Take 2,000 Units by mouth daily.    Marland Kitchen estradiol (ESTRACE VAGINAL) 0.1 MG/GM vaginal cream Place 1 Applicatorful vaginally at bedtime. 42.5 g 12  . Levothyroxine Sodium (SYNTHROID PO) Take 75 mcg by mouth daily.     . Magnesium 250 MG TABS Take 250 mg by mouth daily.    . nitroGLYCERIN (NITRODUR - DOSED IN MG/24  HR) 0.2 mg/hr patch USE 1/4 PATCH DAILY TO THE AFFECTED AREA. 30 patch 1  . rosuvastatin (CRESTOR) 5 MG tablet Take 1 tablet (5 mg total) by mouth daily. 30 tablet 12  . tretinoin (RETIN-A) 4.81 % cream APPLICATIONS APPLY A PEA SIZE TO FACE NIGHTLY AS DIRECTED    . vitamin C (ASCORBIC ACID) 500 MG tablet Take 500 mg by mouth daily.    Marland Kitchen estradiol (ESTRING) 2 MG vaginal ring Place 2 mg vaginally every 3 (three) months. follow package directions 1 each 12   No current facility-administered medications for this visit.   Facility-Administered Medications Ordered in Other Visits  Medication Dose Route Frequency Provider Last Rate Last Admin  . sodium chloride flush (NS) 0.9 % injection 10 mL  10 mL Intracatheter PRN Magrinat, Virgie Dad, MD        OBJECTIVE:   Vitals:   11/17/19 1301  BP: 136/78  Pulse: 71  Resp: 18  Temp: 97.6 F (36.4 C)  SpO2: 99%   Wt Readings from Last 3 Encounters:  11/17/19 160 lb 11.2 oz (72.9 kg)  10/27/19 160 lb 12.8 oz (72.9 kg)  10/22/19 156 lb (70.8 kg)   Body mass index is 25.17 kg/m.     ECOG FS:1 - Symptomatic but completely ambulatory  GENERAL: Patient is a well appearing female in no acute distress HEENT:  Sclerae anicteric.  Mask in place.. Neck is supple.  NODES:  No cervical, supraclavicular, or axillary lymphadenopathy palpated.  BREAST EXAM:  Right upper outer quadrant with small area of increased thickness, otherwise benign, left breast s/p lumpectomy and radiation, no sign of local recurrence. LUNGS:  Clear to auscultation bilaterally.  No wheezes or rhonchi. HEART:  Regular rate and rhythm. No murmur appreciated. ABDOMEN:  Soft, nontender.  Positive, normoactive bowel sounds. No organomegaly palpated. MSK:  No focal spinal tenderness to palpation. Full range of motion bilaterally in the upper extremities. EXTREMITIES:  No peripheral edema.   SKIN:  Clear with no obvious rashes or skin changes. No nail dyscrasia. NEURO:  Nonfocal. Well oriented.  Appropriate affect.     LAB RESULTS:  CMP     Component Value Date/Time   NA 137 10/27/2019 1300   K 4.3 10/27/2019 1300   CL 100 10/27/2019 1300   CO2 25 10/27/2019 1300   GLUCOSE 102 (H) 10/27/2019 1300   BUN 21 10/27/2019 1300   CREATININE 0.79 10/27/2019 1300   CREATININE 0.81 05/12/2019 1145   CALCIUM 8.9 10/27/2019 1300   PROT 7.3 10/27/2019 1300   ALBUMIN 4.1 10/27/2019 1300   AST 18 10/27/2019 1300   AST 19 05/12/2019 1145   ALT 15 10/27/2019 1300   ALT 16 05/12/2019 1145   ALKPHOS 41 10/27/2019 1300   BILITOT 0.4 10/27/2019 1300   BILITOT 0.3 05/12/2019 1145   GFRNONAA >60 10/27/2019 1300   GFRNONAA >60 05/12/2019 1145   GFRAA >60 10/27/2019 1300   GFRAA >60 05/12/2019 1145    No results found for: TOTALPROTELP, ALBUMINELP, A1GS, A2GS, BETS, BETA2SER, GAMS, MSPIKE, SPEI  No results found for: KPAFRELGTCHN, LAMBDASER, KAPLAMBRATIO  Lab Results  Component Value Date   WBC 4.3 11/17/2019   NEUTROABS 3.1 11/17/2019   HGB 11.5 (L) 11/17/2019   HCT 34.0 (L) 11/17/2019   MCV 90.9  11/17/2019   PLT 234 11/17/2019    _0 @  No results found for: LABCA2  No components found for: EHUDJS970  No results for input(s): INR in the last 168 hours.  No results  found for: LABCA2  No results found for: GDJ242  No results found for: AST419  No results found for: QQI297  No results found for: CA2729  No components found for: HGQUANT  No results found for: CEA1 / No results found for: CEA1   No results found for: AFPTUMOR  No results found for: [place]  No results found for: PSA1  Appointment on 11/17/2019  Component Date Value Ref Range Status  . WBC 11/17/2019 4.3  4.0 - 10.5 K/uL Final  . RBC 11/17/2019 3.74* 3.87 - 5.11 MIL/uL Final  . Hemoglobin 11/17/2019 11.5* 12.0 - 15.0 g/dL Final  . HCT 11/17/2019 34.0* 36.0 - 46.0 % Final  . MCV 11/17/2019 90.9  80.0 - 100.0 fL Final  . MCH 11/17/2019 30.7  26.0 - 34.0 pg Final  . MCHC 11/17/2019 33.8  30.0 - 36.0 g/dL Final  . RDW 11/17/2019 12.4  11.5 - 15.5 % Final  . Platelets 11/17/2019 234  150 - 400 K/uL Final  . nRBC 11/17/2019 0.0  0.0 - 0.2 % Final  . Neutrophils Relative % 11/17/2019 72  % Final  . Neutro Abs 11/17/2019 3.1  1.7 - 7.7 K/uL Final  . Lymphocytes Relative 11/17/2019 16  % Final  . Lymphs Abs 11/17/2019 0.7  0.7 - 4.0 K/uL Final  . Monocytes Relative 11/17/2019 9  % Final  . Monocytes Absolute 11/17/2019 0.4  0.1 - 1.0 K/uL Final  . Eosinophils Relative 11/17/2019 2  % Final  . Eosinophils Absolute 11/17/2019 0.1  0.0 - 0.5 K/uL Final  . Basophils Relative 11/17/2019 1  % Final  . Basophils Absolute 11/17/2019 0.1  0.0 - 0.1 K/uL Final  . Immature Granulocytes 11/17/2019 0  % Final  . Abs Immature Granulocytes 11/17/2019 0.01  0.00 - 0.07 K/uL Final   Performed at [facility], [employer] [address], [place], [place] [address]    (this displays the last labs from the last 3 days)  No results found for: TOTALPROTELP, ALBUMINELP, A1GS, A2GS, BETS,  BETA2SER, GAMS, MSPIKE, SPEI (this displays SPEP labs)  No results found for: KPAFRELGTCHN, LAMBDASER, KAPLAMBRATIO (kappa/lambda light chains)  No results found for: HGBA, HGBA2QUANT, HGBFQUANT, HGBSQUAN (Hemoglobinopathy evaluation)   No results found for: LDH  No results found for: IRON, TIBC, IRONPCTSAT (Iron and TIBC)  No results found for: FERRITIN  Urinalysis No results found for: COLORURINE, APPEARANCEUR, LABSPEC, PHURINE, GLUCOSEU, HGBUR, BILIRUBINUR, KETONESUR, PROTEINUR, UROBILINOGEN, NITRITE, LEUKOCYTESUR   STUDIES: No results found.  ELIGIBLE FOR AVAILABLE RESEARCH PROTOCOL: no  ASSESSMENT: 59 y.o. [place] woman status post left breast biopsy 02/12/2019 for a clinical T1b N0, stage IA invasive ductal carcinoma, estrogen receptor positive, progesterone receptor negative, HER-2 amplified, with an MIB-1-1 of 25%  (1) status post left lumpectomy and sentinel lymph node sampling 03/05/2019 for a pT1c pN0, stage IA invasive ductal carcinoma, grade 2, with positive lymphovascular invasion but negative margins  (a) a total of 8 lymph nodes removed (4 sentinel)   (2) adjuvant chemotherapy with paclitaxel and trastuzumab weekly x12 started 04/07/2019  (a) paclitaxel stopped after 4 doses because of peripheral neuropathy  (b) cyclophosphamide, methotrexate, fluorouracil started 05/12/2019, to be repeated q. 21 days x 3  (3) trastuzumab to be continued to complete 1 year (through August 2021)  (a) echo 02/26/2019 shows an ejection fraction in the 60-65% range  (b) echo 05/27/2019 shows an EF of 55-60%  (4) adjuvant radiation 07/15/2019 - 08/13/2019  (a) left breast / 40.05 Gy in 15  fractions  (b) boost / 12 Gy in 6 fractions  (5) tamoxifen 10/17/2019  (6) genetics testing 02/25/2019 through the Multi-Gene Panel offered by [facility] found no deleterious mutations in AIP, ALK, APC, ATM, AXIN2,BAP1,  BARD1, BLM, BMPR1A, BRCA1, BRCA2, BRIP1, CASR, CDC73, CDH1, CDK4, CDKN1B,  CDKN1C, CDKN2A (p14ARF), CDKN2A (p16INK4a), CEBPA, CHEK2, CTNNA1, DICER1, DIS3L2, EGFR (c.2369C>T, p.Thr790Met variant only), EPCAM (Deletion/duplication testing only), FH, FLCN, GATA2, GPC3, GREM1 (Promoter region deletion/duplication testing only), HOXB13 (c.251G>A, p.Gly84Glu), HRAS, KIT, MAX, MEN1, MET, MITF (c.952G>A, p.Glu318Lys variant only), MLH1, MSH2, MSH3, MSH6, MUTYH, NBN, NF1, NF2, NTHL1, PALB2, PDGFRA, PHOX2B, PMS2, POLD1, POLE, POT1, PRKAR1A, PTCH1, PTEN, RAD50, RAD51C, RAD51D, RB1, RECQL4, RET, RNF43, RUNX1, SDHAF2, SDHA (sequence changes only), SDHB, SDHC, SDHD, SMAD4, SMARCA4, SMARCB1, SMARCE1, STK11, SUFU, TERC, TERT, TMEM127, TP53, TSC1, TSC2, VHL, WRN and WT1.    PLAN: Jenan continues on Tamoxifen and Trastuzumab with good tolerance.  She has developed a rash.  It hink this is questionably related to her covid vaccine as it is mild and resolving.  She will let me know if it doesn't continue to improve over the next couple of weeks.    Juleah is concerned about the area on her breast.  This feels like an area of more dense breast tissue.  Since it is changed, I have ordered mammogram and ultrasound of the right breast for her to have completed sometime in the next week or so.    Manahil will continue on treatment with Trastuzumab every 3 weeks with labs prior.  She will undergo echo next month and see Dr. Jana Hakim afterward.  She was recommended to continue with the appropriate pandemic precautions. She knows to call for any questions that may arise between now and her next appointment.  We are happy to see her sooner if needed.  Total encounter time 30 minutes.Wilber Bihari, NP 11/17/19 1:08 PM Medical Oncology and Hematology [facility] [place], [place] [address] Tel. [phone]    Fax. [phone]   *Total Encounter Time as defined by the Centers for Medicare and Medicaid Services includes, in addition to the face-to-face time of a  patient visit (documented in the note above) non-face-to-face time: obtaining and reviewing outside history, ordering and reviewing medications, tests or procedures, care coordination (communications with other health care professionals or caregivers) and documentation in the medical record.

## 2019-11-18 ENCOUNTER — Telehealth: Payer: Self-pay | Admitting: Adult Health

## 2019-11-18 NOTE — Telephone Encounter (Signed)
Changed 6/29 appt type per 3/23 los. Made no other changes to pt's schedule per 3/23 los.

## 2019-11-19 ENCOUNTER — Other Ambulatory Visit: Payer: Self-pay

## 2019-11-19 ENCOUNTER — Ambulatory Visit (HOSPITAL_COMMUNITY)
Admission: RE | Admit: 2019-11-19 | Discharge: 2019-11-19 | Disposition: A | Discharge status: Home / Self Care | Payer: Managed Care, Other (non HMO) | Source: Ambulatory Visit | Attending: Oncology | Admitting: Oncology

## 2019-11-19 DIAGNOSIS — C50512 Malignant neoplasm of lower-outer quadrant of left female breast: Secondary | ICD-10-CM | POA: Diagnosis not present

## 2019-11-19 DIAGNOSIS — Z01818 Encounter for other preprocedural examination: Secondary | ICD-10-CM | POA: Diagnosis not present

## 2019-11-19 DIAGNOSIS — Z170 Estrogen receptor positive status [ER+]: Secondary | ICD-10-CM | POA: Diagnosis not present

## 2019-11-19 NOTE — Progress Notes (Signed)
  Echocardiogram 2D Echocardiogram has been performed.  Michiel Cowboy 11/19/2019, 9:48 AM

## 2019-12-02 NOTE — Progress Notes (Signed)
Pharmacist Chemotherapy Monitoring - Follow Up Assessment    I verify that I have reviewed each item in the below checklist:  . Regimen for the patient is scheduled for the appropriate day and plan matches scheduled date. Marland Kitchen Appropriate non-routine labs are ordered dependent on drug ordered. . If applicable, additional medications reviewed and ordered per protocol based on lifetime cumulative doses and/or treatment regimen.   Plan for follow-up and/or issues identified: No . I-vent associated with next due treatment: No . MD and/or nursing notified: No  Allison Hanna D 12/02/2019 2:03 PM

## 2019-12-03 ENCOUNTER — Ambulatory Visit: Payer: Managed Care, Other (non HMO) | Admitting: Sports Medicine

## 2019-12-07 NOTE — Progress Notes (Signed)
[place]  [phone] [phone]     ID: Allison Hanna DOB: [birth year]  MR#: [id number]  DXI#:[id number]  Patient Care Team: Merrilee Seashore, MD as PCP - General (Internal Medicine) Mauro Kaufmann, RN as Oncology Nurse Navigator Rockwell Germany, RN as Oncology Nurse Navigator Daimien Patmon, Virgie Dad, MD as Consulting Physician (Oncology) Gery Pray, MD as Consulting Physician (Radiation Oncology) Fanny Skates, MD as Consulting Physician (General Surgery) Brien Few, MD as Consulting Physician (Obstetrics and Gynecology) Larey Dresser, MD as Consulting Physician (Cardiology) Feliberto Harts R (Inactive) Felipa Furnace, DPM as Consulting Physician (Podiatry) Rolm Bookbinder, MD as Consulting Physician (Dermatology) Thurman Coyer, DO as Consulting Physician (Sports Medicine) Chauncey Cruel, MD OTHER MD:  CHIEF COMPLAINT: Estrogen receptor positive breast cancer  CURRENT TREATMENT: trastuzumab, tamoxifen   INTERVAL HISTORY: Allison Hanna returns today for follow-up and treatment of her estrogen receptor positive breast cancer.   She continues on adjuvant trastuzumab (Ogivri).  She underwent echocardiogram on 11/19/2019 showing an ejection fraction of 60-65%, which is stable from prior in 07/2019.  She continues on Tamoxifen.  She is not having any significant issues related to this.  She is using Estrace cream 2-3 times per week and notes that her vaginal dryness and atrophy is much improved.   She was last seen on 11/17/2019 for a new breast area of concern in her right breast.  In light of this, she is scheduled for right diagnostic mammogram and right breast ultrasound tomorrow, 12/09/2019.  However it turns out she received her second Pfizer vaccine dose just 4 days ago.  We are canceling that mammogram and ultrasound as discussed below  Her annual diagnostic mammogram is scheduled for 02/12/2020.   REVIEW OF SYSTEMS: Allison Hanna walks  between 2 and 3 miles every day.  Her husband wonders if she is not a little bit more tired than she used to be and of course this is very normal at this point in her treatment.  The slight anemia that she has is not the cause.  She is having some discomfort in the left rib area below the left breast.  She also felt something different in the right breast which is the reason our nurse practitioner set her up for a right mammogram and ultrasound.  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 Ubaldo Glassing her dermatologist.  Allison Hanna also has significant discomfort in the right shoulder which has been diagnosed as a rotator tear.  She is considering surgery.  A detailed review of systems today was otherwise noncontributory   HISTORY OF CURRENT ILLNESS: From the original intake note:  "Allison Hanna" presented with a palpable left breast mass. She initially experienced soreness to her left breast around April 2020 and tried to get a mammogram at this point, but due to pandemic concerns, the scan was delayed.  She did not actually feel a mass in the breast until June 1.  She underwent bilateral diagnostic mammography with tomography and left breast ultrasonography at [facility] on 02/11/2019 showing: breast density category B; irregular hyperechoic mass in the left breast at 4 o'clock 5 cm from the nipple measuring 8 mm; no enlarged adenopathy in the left axilla.  Accordingly on 02/12/2019 she proceeded to biopsy of the left breast area in question. The pathology from this procedure ([id number]) showed: invasive mammary carcinoma, e-cadherin positive. Prognostic indicators significant for: estrogen receptor, 100% positive with strong staining  intensity and progesterone receptor, 0% negative. Proliferation marker Ki67 at 25%. HER2 positive by immunohistochemistry, (3+).  The patient's subsequent history is as detailed below.   PAST MEDICAL HISTORY: Past  Medical History:  Diagnosis Date  . Anxiety   . Family history of breast cancer   . Family history of melanoma   . High cholesterol   . Hypothyroid   . Paralyzed vocal cords 2014  . Raynauds syndrome    cold weather triggers fingers to turn white  History of palpitations (resolved)   PAST SURGICAL HISTORY: Past Surgical History:  Procedure Laterality Date  . BREAST LUMPECTOMY WITH RADIOACTIVE SEED AND SENTINEL LYMPH NODE BIOPSY Left 03/05/2019   Procedure: LEFT BREAST LUMPECTOMY WITH RADIOACTIVE SEED AND LEFT AXILLARY DEEP SENTINEL LYMPH NODE BIOPSY INJECT BLUE DYE LEFT BREAST;  Surgeon: Fanny Skates, MD;  Location: [facility];  Service: General;  Laterality: Left;  . KNEE SURGERY Bilateral    arthroscopy for menicus tears  . PORTACATH PLACEMENT Right 03/05/2019   Procedure: INSERTION PORT-A-CATH;  Surgeon: Fanny Skates, MD;  Location: [facility];  Service: General;  Laterality: Right;  . WRIST SURGERY      FAMILY HISTORY: Family History  Problem Relation Age of Onset  . Breast cancer Mother 78  . Cervical cancer Mother   . Heart attack Father   . Skin cancer Brother   . Breast cancer Paternal Aunt        dx over 73s  . Breast cancer Paternal Aunt        dx over 51  . Uterine cancer Paternal Aunt 13  . Cervical cancer Maternal Aunt   . Liver cancer Paternal Uncle 59  . Stroke Maternal Grandmother   . Parkinson's disease Paternal Grandfather   . Breast cancer Cousin 71       mother's maternal first cousin  . Melanoma Cousin 62       pat first cousin  Patient's father was 57 years old when he died from heart attack. Patient's mother died at age 68. She was diagnosed with breast cancer at age 63. Two paternal aunts were diagnosed with breast cancer at older ages, one of which also had uterine cancer. She has 1 brother. He has a history of skin cancer.    GYNECOLOGIC HISTORY:  No LMP recorded. Patient is postmenopausal. Menarche: 59 years  old Age at first live birth: 59 years old [place] P 3 LMP 2007 Contraceptive: yes, pill for about 8 years, no complications HRT no  Hysterectomy? no BSO? 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 She is married. [name] is in charge of operations for [employer]. She lives at home with her husband. [name], age 90, lives in [place], [place] as a Research scientist (life sciences). [name], age 82, lives in [place] as a Careers adviser. [name], age 12, lives in [place] as a Ship broker and works part-time in [employer]. She has no grandchildren.     ADVANCED DIRECTIVES: [name] is automatically her HCPOA.   HEALTH MAINTENANCE: Social History   Tobacco Use  . Smoking status: Never Smoker  . Smokeless tobacco: Never Used  Substance Use Topics  . Alcohol use: Yes    Alcohol/week: 2.0 standard drinks    Types: 2 Standard drinks or equivalent per week  . Drug use: Never     Colonoscopy: 2010  PAP: 08/2018  Bone density:  2019, osteopenia   No Known Allergies  Current Outpatient Medications  Medication Sig Dispense Refill  . B Complex-Folic Acid (SUPER B COMPLEX MAXI) TABS Take by mouth.    . calcium carbonate (OS-CAL) 600 MG TABS tablet Take 1,200 mg by mouth 2 (two) times daily with a meal.    . Cholecalciferol (VITAMIN D3) 25 MCG (1000 UT) CAPS Take 2,000 Units by mouth daily.    Marland Kitchen estradiol (ESTRACE VAGINAL) 0.1 MG/GM vaginal cream Place 1 Applicatorful vaginally at bedtime. 42.5 g 12  . estradiol (ESTRING) 2 MG vaginal ring Place 2 mg vaginally every 3 (three) months. follow package directions 1 each 12  . Levothyroxine Sodium (SYNTHROID PO) Take 75 mcg by mouth daily.     . Magnesium 250 MG TABS Take 250 mg by mouth daily.    . nitroGLYCERIN (NITRODUR - DOSED IN MG/24 HR) 0.2 mg/hr patch USE 1/4 PATCH DAILY TO THE AFFECTED AREA. 30 patch 1  . rosuvastatin (CRESTOR) 5 MG  tablet Take 1 tablet (5 mg total) by mouth daily. 30 tablet 12  . tretinoin (RETIN-A) 8.18 % cream APPLICATIONS APPLY A PEA SIZE TO FACE NIGHTLY AS DIRECTED    . vitamin C (ASCORBIC ACID) 500 MG tablet Take 500 mg by mouth daily.     No current facility-administered medications for this visit.   Facility-Administered Medications Ordered in Other Visits  Medication Dose Route Frequency Provider Last Rate Last Admin  . sodium chloride flush (NS) 0.9 % injection 10 mL  10 mL Intracatheter PRN Annice Jolly, Virgie Dad, MD        OBJECTIVE: White woman in no acute distress  Vitals:   12/08/19 1028  BP: 136/79  Pulse: 71  Resp: 20  Temp: 98.3 F (36.8 C)  SpO2: 97%   Wt Readings from Last 3 Encounters:  12/08/19 161 lb 1.6 oz (73.1 kg)  11/17/19 160 lb 11.2 oz (72.9 kg)  10/27/19 160 lb 12.8 oz (72.9 kg)   Body mass index is 25.23 kg/m.    ECOG FS:1 - Symptomatic but completely ambulatory  Sclerae unicteric, EOMs intact Wearing a mask No cervical or supraclavicular adenopathy Lungs no rales or rhonchi Heart regular rate and rhythm Abd soft, nontender, positive bowel sounds MSK no focal spinal tenderness, no upper extremity lymphedema Neuro: nonfocal, well oriented, appropriate affect Breasts: I do not palpate any abnormality in the right breast area.  There is a small transition there as she gets into a different portion of the breast but it certainly is not suggestive of cancer and the right axilla is benign.  On the left breast of course she is status post surgery and radiation.  The area of concern is not tender or erythematous or swollen and I think it simply post surgical change, with no evidence of cancer there either.   LAB RESULTS:  CMP     Component Value Date/Time   NA 138 12/08/2019 1005   K 4.0 12/08/2019 1005   CL 102 12/08/2019 1005   CO2 26 12/08/2019 1005   GLUCOSE 98 12/08/2019 1005   BUN 15 12/08/2019 1005   CREATININE 0.83 12/08/2019 1005   CREATININE  0.81 05/12/2019 1145   CALCIUM 9.0 12/08/2019 1005   PROT 6.9 12/08/2019 1005   ALBUMIN 3.6 12/08/2019 1005   AST 19 12/08/2019 1005   AST 19 05/12/2019 1145   ALT 15 12/08/2019 1005   ALT 16 05/12/2019 1145   ALKPHOS 33 (L) 12/08/2019 1005   BILITOT 0.2 (L) 12/08/2019  1005   BILITOT 0.3 05/12/2019 1145   GFRNONAA >60 12/08/2019 1005   GFRNONAA >60 05/12/2019 1145   GFRAA >60 12/08/2019 1005   GFRAA >60 05/12/2019 1145    No results found for: TOTALPROTELP, ALBUMINELP, A1GS, A2GS, BETS, BETA2SER, GAMS, MSPIKE, SPEI  No results found for: KPAFRELGTCHN, LAMBDASER, KAPLAMBRATIO  Lab Results  Component Value Date   WBC 4.6 12/08/2019   NEUTROABS 3.3 12/08/2019   HGB 11.6 (L) 12/08/2019   HCT 34.5 (L) 12/08/2019   MCV 91.8 12/08/2019   PLT 238 12/08/2019    No results found for: LABCA2  No components found for: LABCAN125  No results for input(s): INR in the last 168 hours.  No results found for: LABCA2  No results found for: CAN199  No results found for: CAN125  No results found for: CAN153  No results found for: CA2729  No components found for: HGQUANT  No results found for: CEA1 / No results found for: CEA1   No results found for: AFPTUMOR  No results found for: CHROMOGRNA  No results found for: HGBA, HGBA2QUANT, HGBFQUANT, HGBSQUAN (Hemoglobinopathy evaluation)   No results found for: LDH  No results found for: IRON, TIBC, IRONPCTSAT (Iron and TIBC)  No results found for: FERRITIN  Urinalysis No results found for: COLORURINE, APPEARANCEUR, LABSPEC, PHURINE, GLUCOSEU, HGBUR, BILIRUBINUR, KETONESUR, PROTEINUR, UROBILINOGEN, NITRITE, LEUKOCYTESUR   STUDIES: ECHOCARDIOGRAM COMPLETE  Result Date: 11/20/2019    ECHOCARDIOGRAM REPORT   Patient Name:   Camila C Passe Date of Exam: 11/19/2019 Medical Rec #:  [id number]         Height:       67.0 in Accession #:    [id number]        Weight:       160.7 lb Date of Birth:  08/12/1961          BSA:           1.843 m Patient Age:    59 years          BP:           160/89 mmHg Patient Gender: F                 HR:           69 bpm. Exam Location:  Inpatient Procedure: 2D Echo, Cardiac Doppler, Color Doppler and Strain Analysis Indications:    Chemo V67.2  History:        Patient has prior history of Echocardiogram examinations, most                 recent 08/07/2019. Risk Factors:Non-Smoker.  Sonographer:    Julia Swaim RDCS Referring Phys: [id number]  C  IMPRESSIONS  1. Left ventricular ejection fraction, by estimation, is 60 to 65%. Left ventricular ejection fraction by 3D volume is 63 %. The left ventricle has normal function. The left ventricle has no regional wall motion abnormalities. Left ventricular diastolic  parameters were normal. The average left ventricular global longitudinal strain is -22.1 %.  2. Right ventricular systolic function is normal. The right ventricular size is normal. Tricuspid regurgitation signal is inadequate for assessing PA pressure.  3. The mitral valve is normal in structure. No evidence of mitral valve regurgitation.  4. The aortic valve was not well visualized. Aortic valve regurgitation is not visualized. No aortic stenosis is present.  5. The inferior vena cava is dilated in size with >50% respiratory variability, suggesting right atrial pressure of 8 mmHg. FINDINGS  Left   Ventricle: Left ventricular ejection fraction, by estimation, is 60 to 65%. Left ventricular ejection fraction by 3D volume is 63 % The left ventricle has normal function. The left ventricle has no regional wall motion abnormalities. The average left ventricular global longitudinal strain is -22.1 %. The left ventricular internal cavity size was normal in size. There is no left ventricular hypertrophy. Left ventricular diastolic parameters were normal. Right Ventricle: The right ventricular size is normal. No increase in right ventricular wall thickness. Right ventricular systolic function is normal.  Tricuspid regurgitation signal is inadequate for assessing PA pressure. Left Atrium: Left atrial size was normal in size. Right Atrium: Right atrial size was normal in size. Pericardium: Trivial pericardial effusion is present. Mitral Valve: The mitral valve is normal in structure. No evidence of mitral valve regurgitation. Tricuspid Valve: The tricuspid valve is normal in structure. Tricuspid valve regurgitation is trivial. Aortic Valve: The aortic valve was not well visualized. Aortic valve regurgitation is not visualized. No aortic stenosis is present. Pulmonic Valve: The pulmonic valve was not well visualized. Pulmonic valve regurgitation is not visualized. Aorta: The aortic root is normal in size and structure. Venous: The inferior vena cava is dilated in size with greater than 50% respiratory variability, suggesting right atrial pressure of 8 mmHg. IAS/Shunts: No atrial Hanna shunt detected by color flow Doppler.  LEFT VENTRICLE PLAX 2D LVIDd:         3.90 cm         Diastology LVIDs:         2.90 cm         LV e' lateral:   11.60 cm/s LV PW:         0.90 cm         LV E/e' lateral: 6.3 LV IVS:        0.90 cm         LV e' medial:    9.36 cm/s LVOT diam:     2.10 cm         LV E/e' medial:  7.8 LV SV:         80 LV SV Index:   43              2D LVOT Area:     3.46 cm        Longitudinal                                Strain                                2D Strain GLS  -22.1 % LV Volumes (MOD)               Avg: LV vol d, MOD    118.0 ml A2C:                           3D Volume EF LV vol d, MOD    116.0 ml      LV 3D EF:    Left A4C:                                        ventricular LV vol s, MOD    44.2 ml                      ejection A2C:                                        fraction by LV vol s, MOD    43.1 ml                    3D volume A4C:                                        is 63 % LV SV MOD A2C:   73.8 ml LV SV MOD A4C:   116.0 ml LV SV MOD BP:    73.5 ml RIGHT VENTRICLE RV S prime:     12.20  cm/s TAPSE (M-mode): 2.1 cm LEFT ATRIUM             Index       RIGHT ATRIUM           Index LA diam:        3.30 cm 1.79 cm/m  RA Area:     12.20 cm LA Vol (A2C):   39.1 ml 21.22 ml/m RA Volume:   29.30 ml  15.90 ml/m LA Vol (A4C):   29.1 ml 15.79 ml/m LA Biplane Vol: 33.4 ml 18.13 ml/m  AORTIC VALVE LVOT Vmax:   106.00 cm/s LVOT Vmean:  70.700 cm/s LVOT VTI:    0.230 m  AORTA Ao Root diam: 3.30 cm MITRAL VALVE MV Area (PHT): 3.31 cm    SHUNTS MV Decel Time: 229 msec    Systemic VTI:  0.23 m MV E velocity: 73.30 cm/s  Systemic Diam: 2.10 cm MV A velocity: 40.70 cm/s MV E/A ratio:  1.80 Christopher Schumann MD Electronically signed by Christopher Schumann MD Signature Date/Time: 11/20/2019/12:16:09 AM    Final     ELIGIBLE FOR AVAILABLE RESEARCH PROTOCOL: no  ASSESSMENT: 59 y.o. Allison Hanna woman status post left breast biopsy 02/12/2019 for a clinical T1b N0, stage IA invasive ductal carcinoma, estrogen receptor positive, progesterone receptor negative, HER-2 amplified, with an MIB-1-1 of 25%  (1) status post left lumpectomy and sentinel lymph node sampling 03/05/2019 for a pT1c pN0, stage IA invasive ductal carcinoma, grade 2, with positive lymphovascular invasion but negative margins  (a) a total of 8 lymph nodes removed (4 sentinel)   (2) adjuvant chemotherapy with paclitaxel and trastuzumab weekly x12 started 04/07/2019  (a) paclitaxel stopped after 4 doses because of peripheral neuropathy  (b) cyclophosphamide, methotrexate, fluorouracil started 05/12/2019, to be repeated q. 21 days x 3  (3) trastuzumab to be continued to complete 1 year (through August 2021)  (a) echo 02/26/2019 shows an ejection fraction in the 60-65% range  (b) echo 05/27/2019 shows an EF of 55-60%  (4) adjuvant radiation 07/15/2019 - 08/13/2019  (a) left breast / 40.05 Gy in 15 fractions  (b) boost / 12 Gy in 6 fractions  (5) tamoxifen started 10/17/2019  (6) genetics testing 02/25/2019 through the Multi-Gene  Panel offered by [facility] found no deleterious mutations in AIP, ALK, APC, ATM, AXIN2,BAP1,  BARD1, BLM, BMPR1A, BRCA1, BRCA2, BRIP1, CASR, CDC73, CDH1, CDK4, CDKN1B, CDKN1C, CDKN2A (p14ARF), CDKN2A (p16INK4a), CEBPA, CHEK2, CTNNA1, DICER1, DIS3L2, EGFR (c.2369C>T, p.Thr790Met variant only), EPCAM (Deletion/duplication testing only), FH, FLCN, GATA2, GPC3, GREM1 (Promoter region deletion/duplication testing only), HOXB13 (c.251G>A, p.Gly84Glu), HRAS, KIT, MAX, MEN1, MET, MITF (c.952G>A,   p.Glu318Lys variant only), MLH1, MSH2, MSH3, MSH6, MUTYH, NBN, NF1, NF2, NTHL1, PALB2, PDGFRA, PHOX2B, PMS2, POLD1, POLE, POT1, PRKAR1A, PTCH1, PTEN, RAD50, RAD51C, RAD51D, RB1, RECQL4, RET, RNF43, RUNX1, SDHAF2, SDHA (sequence changes only), SDHB, SDHC, SDHD, SMAD4, SMARCA4, SMARCB1, SMARCE1, STK11, SUFU, TERC, TERT, TMEM127, TP53, TSC1, TSC2, VHL, WRN and WT1.     PLAN: Kadeja had many questions regarding her echocardiogram but this is essentially stable.  She is tolerating trastuzumab well and I do not think her Grovers disease is related.  She only finished her radiation 4 months ago and she is doing very well walking 2 to 3 miles daily but people really do not recover from the intensive treatment that she has had for up to a year.  In addition to the physical recovery there is the emotional recovery and that can take up to 3 years.  All that is very normal and she is progressing right through it without any complications  I do not know why her next appointment and treatment had been scheduled for 2 weeks from today but that is being corrected.  She will be treated 3 weeks from today and every 3 weeks until she completes her treatment in August.  We are canceling the mammography and ultrasonography tomorrow given the fact that she just had her Covid vaccine.  She will have her next mammogram in June as scheduled.  I have no problems with her undergoing right rotator cuff surgery if that is what she and Dr.Draper  plan  Otherwise she will return to see me in 9 weeks.  She knows to call for any other issue that may develop before then.  Total encounter time 35 minutes.Sarajane Jews C. Timmie Dugue, MD 12/08/19 11:12 AM Medical Oncology and Hematology [facility] [place], [place] [address] Tel. [phone]    Fax. [phone]   I, Wilburn Mylar, am acting as scribe for Dr. Virgie Dad. Atiba Kimberlin.  I, [name] MD, have reviewed the above documentation for accuracy and completeness, and I agree with the above.    *Total Encounter Time as defined by the Centers for Medicare and Medicaid Services includes, in addition to the face-to-face time of a patient visit (documented in the note above) non-face-to-face time: obtaining and reviewing outside history, ordering and reviewing medications, tests or procedures, care coordination (communications with other health care professionals or caregivers) and documentation in the medical record.

## 2019-12-08 ENCOUNTER — Inpatient Hospital Stay (HOSPITAL_BASED_OUTPATIENT_CLINIC_OR_DEPARTMENT_OTHER): Payer: Managed Care, Other (non HMO) | Admitting: Oncology

## 2019-12-08 ENCOUNTER — Inpatient Hospital Stay: Payer: Managed Care, Other (non HMO)

## 2019-12-08 ENCOUNTER — Inpatient Hospital Stay: Payer: Managed Care, Other (non HMO) | Attending: Oncology

## 2019-12-08 ENCOUNTER — Other Ambulatory Visit: Payer: Self-pay

## 2019-12-08 VITALS — BP 136/79 | HR 71 | Temp 98.3°F | Resp 20 | Ht 67.0 in | Wt 161.1 lb

## 2019-12-08 DIAGNOSIS — Z79899 Other long term (current) drug therapy: Secondary | ICD-10-CM | POA: Diagnosis not present

## 2019-12-08 DIAGNOSIS — R768 Other specified abnormal immunological findings in serum: Secondary | ICD-10-CM

## 2019-12-08 DIAGNOSIS — Z170 Estrogen receptor positive status [ER+]: Secondary | ICD-10-CM

## 2019-12-08 DIAGNOSIS — C50512 Malignant neoplasm of lower-outer quadrant of left female breast: Secondary | ICD-10-CM | POA: Insufficient documentation

## 2019-12-08 DIAGNOSIS — Z5112 Encounter for antineoplastic immunotherapy: Secondary | ICD-10-CM | POA: Diagnosis present

## 2019-12-08 DIAGNOSIS — C773 Secondary and unspecified malignant neoplasm of axilla and upper limb lymph nodes: Secondary | ICD-10-CM | POA: Diagnosis not present

## 2019-12-08 DIAGNOSIS — Z79810 Long term (current) use of selective estrogen receptor modulators (SERMs): Secondary | ICD-10-CM | POA: Diagnosis not present

## 2019-12-08 DIAGNOSIS — Z95828 Presence of other vascular implants and grafts: Secondary | ICD-10-CM

## 2019-12-08 LAB — COMPREHENSIVE METABOLIC PANEL
ALT: 15 U/L (ref 0–44)
AST: 19 U/L (ref 15–41)
Albumin: 3.6 g/dL (ref 3.5–5.0)
Alkaline Phosphatase: 33 U/L — ABNORMAL LOW (ref 38–126)
Anion gap: 10 (ref 5–15)
BUN: 15 mg/dL (ref 8–23)
CO2: 26 mmol/L (ref 22–32)
Calcium: 9 mg/dL (ref 8.9–10.3)
Chloride: 102 mmol/L (ref 98–111)
Creatinine, Ser: 0.83 mg/dL (ref 0.44–1.00)
GFR calc Af Amer: >60 mL/min (ref >60)
GFR calc non Af Amer: >60 mL/min (ref >60)
Glucose, Bld: 98 mg/dL (ref 70–99)
Potassium: 4 mmol/L (ref 3.5–5.1)
Sodium: 138 mmol/L (ref 135–145)
Total Bilirubin: 0.2 mg/dL — ABNORMAL LOW (ref 0.3–1.2)
Total Protein: 6.9 g/dL (ref 6.5–8.1)

## 2019-12-08 LAB — CBC WITH DIFFERENTIAL/PLATELET
Abs Immature Granulocytes: 0.01 10*3/uL (ref 0.00–0.07)
Basophils Absolute: 0.1 10*3/uL (ref 0.0–0.1)
Basophils Relative: 1 %
Eosinophils Absolute: 0.1 10*3/uL (ref 0.0–0.5)
Eosinophils Relative: 2 %
HCT: 34.5 % — ABNORMAL LOW (ref 36.0–46.0)
Hemoglobin: 11.6 g/dL — ABNORMAL LOW (ref 12.0–15.0)
Immature Granulocytes: 0 %
Lymphocytes Relative: 14 %
Lymphs Abs: 0.6 10*3/uL — ABNORMAL LOW (ref 0.7–4.0)
MCH: 30.9 pg (ref 26.0–34.0)
MCHC: 33.6 g/dL (ref 30.0–36.0)
MCV: 91.8 fL (ref 80.0–100.0)
Monocytes Absolute: 0.5 10*3/uL (ref 0.1–1.0)
Monocytes Relative: 10 %
Neutro Abs: 3.3 10*3/uL (ref 1.7–7.7)
Neutrophils Relative %: 73 %
Platelets: 238 10*3/uL (ref 150–400)
RBC: 3.76 MIL/uL — ABNORMAL LOW (ref 3.87–5.11)
RDW: 12.6 % (ref 11.5–15.5)
WBC: 4.6 10*3/uL (ref 4.0–10.5)
nRBC: 0 % (ref 0.0–0.2)

## 2019-12-08 LAB — LIPID PANEL
Cholesterol: 184 mg/dL (ref 0–200)
HDL: 81 mg/dL (ref >40)
LDL Cholesterol: 93 mg/dL (ref 0–99)
Total CHOL/HDL Ratio: 2.3 RATIO
Triglycerides: 50 mg/dL (ref <150)
VLDL: 10 mg/dL (ref 0–40)

## 2019-12-08 LAB — VITAMIN D 25 HYDROXY (VIT D DEFICIENCY, FRACTURES): Vit D, 25-Hydroxy: 42.96 ng/mL (ref 30–100)

## 2019-12-08 MED ORDER — ACETAMINOPHEN 325 MG PO TABS
650.0000 mg | ORAL_TABLET | Freq: Once | ORAL | Status: AC
  Administered 2019-12-08: 11:00:00 650 mg via ORAL

## 2019-12-08 MED ORDER — SODIUM CHLORIDE 0.9% FLUSH
10.0000 mL | INTRAVENOUS | Status: DC | PRN
  Administered 2019-12-08: 10 mL
  Filled 2019-12-08: qty 10

## 2019-12-08 MED ORDER — SODIUM CHLORIDE 0.9% FLUSH
10.0000 mL | INTRAVENOUS | Status: DC | PRN
  Administered 2019-12-08: 13:00:00 10 mL
  Filled 2019-12-08: qty 10

## 2019-12-08 MED ORDER — DIPHENHYDRAMINE HCL 25 MG PO CAPS
25.0000 mg | ORAL_CAPSULE | Freq: Once | ORAL | Status: AC
  Administered 2019-12-08: 11:00:00 25 mg via ORAL

## 2019-12-08 MED ORDER — DIPHENHYDRAMINE HCL 25 MG PO CAPS
ORAL_CAPSULE | ORAL | Status: AC
  Filled 2019-12-08: qty 1

## 2019-12-08 MED ORDER — TRASTUZUMAB-DKST CHEMO 150 MG IV SOLR
450.0000 mg | Freq: Once | INTRAVENOUS | Status: AC
  Administered 2019-12-08: 12:00:00 450 mg via INTRAVENOUS
  Filled 2019-12-08: qty 21.43

## 2019-12-08 MED ORDER — SODIUM CHLORIDE 0.9 % IV SOLN
Freq: Once | INTRAVENOUS | Status: AC
  Filled 2019-12-08: qty 250

## 2019-12-08 MED ORDER — HEPARIN SOD (PORK) LOCK FLUSH 100 UNIT/ML IV SOLN
500.0000 [IU] | Freq: Once | INTRAVENOUS | Status: AC | PRN
  Administered 2019-12-08: 500 [IU]
  Filled 2019-12-08: qty 5

## 2019-12-08 MED ORDER — ACETAMINOPHEN 325 MG PO TABS
ORAL_TABLET | ORAL | Status: AC
  Filled 2019-12-08: qty 2

## 2019-12-08 NOTE — Patient Instructions (Signed)
[facility] Discharge Instructions for Patients Receiving Chemotherapy  Today you received the following Immunotherapy agent: Trastuzumab -dkst (OGIVRI)  To help prevent nausea and vomiting after your treatment, we encourage you to take your nausea medication as directed by your MD.   If you develop nausea and vomiting that is not controlled by your nausea medication, call the clinic.   BELOW ARE SYMPTOMS THAT SHOULD BE REPORTED IMMEDIATELY:  *FEVER GREATER THAN 100.5 F  *CHILLS WITH OR WITHOUT FEVER  NAUSEA AND VOMITING THAT IS NOT CONTROLLED WITH YOUR NAUSEA MEDICATION  *UNUSUAL SHORTNESS OF BREATH  *UNUSUAL BRUISING OR BLEEDING  TENDERNESS IN MOUTH AND THROAT WITH OR WITHOUT PRESENCE OF ULCERS  *URINARY PROBLEMS  *BOWEL PROBLEMS  UNUSUAL RASH Items with * indicate a potential emergency and should be followed up as soon as possible.  Feel free to call the clinic should you have any questions or concerns. The clinic phone number is [phone].  Please show the [facility] at check-in to the Emergency Department and triage nurse.  Coronavirus (COVID-19) Are you at risk?  Are you at risk for the Coronavirus (COVID-19)?  To be considered HIGH RISK for Coronavirus (COVID-19), you have to meet the following criteria:  . Traveled to [place], [place], [place], [place] or [place]; or in the [place] to [place], [place], [place], or [place]; and have fever, cough, and shortness of breath within the last 2 weeks of travel OR . Been in close contact with a person diagnosed with COVID-19 within the last 2 weeks and have fever, cough, and shortness of breath . IF YOU DO NOT MEET THESE CRITERIA, YOU ARE CONSIDERED LOW RISK FOR COVID-19.  What to do if you are HIGH RISK for COVID-19?  Marland Kitchen If you are having a medical emergency, call 911. . Seek medical care right away. Before you go to a doctor's office, urgent care or emergency department, call ahead  and tell them about your recent travel, contact with someone diagnosed with COVID-19, and your symptoms. You should receive instructions from your physician's office regarding next steps of care.  . When you arrive at healthcare provider, tell the healthcare staff immediately you have returned from visiting [place], [place], [place], [place] or [place]; or traveled in the [place] to [place], [place], [place], or [place]; in the last two weeks or you have been in close contact with a person diagnosed with COVID-19 in the last 2 weeks.   . Tell the health care staff about your symptoms: fever, cough and shortness of breath. . After you have been seen by a medical provider, you will be either: o Tested for (COVID-19) and discharged home on quarantine except to seek medical care if symptoms worsen, and asked to  - Stay home and avoid contact with others until you get your results (4-5 days)  - Avoid travel on public transportation if possible (such as bus, train, or airplane) or o Sent to the Emergency Department by EMS for evaluation, COVID-19 testing, and possible admission depending on your condition and test results.  What to do if you are LOW RISK for COVID-19?  Reduce your risk of any infection by using the same precautions used for avoiding the common cold or flu:  Marland Kitchen Wash your hands often with soap and warm water for at least 20 seconds.  If soap and water are not readily available, use an alcohol-based hand sanitizer with at least 60% alcohol.  Marland Kitchen  If coughing or sneezing, cover your mouth and nose by coughing or sneezing into the elbow areas of your shirt or coat, into a tissue or into your sleeve (not your hands). . Avoid shaking hands with others and consider head nods or verbal greetings only. . Avoid touching your eyes, nose, or mouth with unwashed hands.  . Avoid close contact with people who are sick. . Avoid places or events with large numbers of people in one location, like  concerts or sporting events. . Carefully consider travel plans you have or are making. . If you are planning any travel outside or inside the [place], visit the CDC's Travelers' Health webpage for the latest health notices. . If you have some symptoms but not all symptoms, continue to monitor at home and seek medical attention if your symptoms worsen. . If you are having a medical emergency, call 911.   ADDITIONAL HEALTHCARE OPTIONS FOR PATIENTS  [facility] / e-Visit: [email or web address]         [facility]: [phone]  [facility]: [phone]                   [facility]: [phone]  

## 2019-12-09 ENCOUNTER — Telehealth: Payer: Self-pay | Admitting: Oncology

## 2019-12-09 ENCOUNTER — Other Ambulatory Visit: Payer: Managed Care, Other (non HMO)

## 2019-12-09 NOTE — Telephone Encounter (Signed)
Cancelled and Scheduled appts per 4/13 los. Left voicemail with next appt date and time.

## 2019-12-15 ENCOUNTER — Ambulatory Visit (INDEPENDENT_AMBULATORY_CARE_PROVIDER_SITE_OTHER): Payer: Managed Care, Other (non HMO) | Admitting: Sports Medicine

## 2019-12-15 ENCOUNTER — Other Ambulatory Visit: Payer: Self-pay

## 2019-12-15 ENCOUNTER — Other Ambulatory Visit: Payer: Managed Care, Other (non HMO)

## 2019-12-15 ENCOUNTER — Ambulatory Visit: Payer: Managed Care, Other (non HMO) | Admitting: Oncology

## 2019-12-15 VITALS — BP 138/82 | Ht 67.0 in | Wt 157.0 lb

## 2019-12-15 DIAGNOSIS — M25511 Pain in right shoulder: Secondary | ICD-10-CM | POA: Diagnosis not present

## 2019-12-15 NOTE — Patient Instructions (Signed)
  Dr Tamera Punt [facility]) Dr Onnie Graham ([facility]) Dr Griffin Basil or Mardelle Matte ([facility]) Dr Marlou [facility])

## 2019-12-16 NOTE — Progress Notes (Signed)
Patient ID: Allison Hanna, female   DOB: July 09, 1961, 59 y.o.   MRN: [id number]  Patient comes in today for follow-up on right shoulder pain.  Since her last office visit she has had extensive physical therapy with Angelia Mould.  Although she has improved her range of motion and continues to have excellent strength, she continues to endorse pain with activity and with sleeping at night.  Previous ultrasound suggested a partial thickness rotator cuff tear.  Treatment to this point, in addition to formal physical therapy, has been topical nitroglycerin patches and a home exercise program.  Despite all of this, she continues to struggle.  Patient first presented to me with this issue almost 6 months ago.  She has been compliant with all treatment to date but continues to have shoulder pain likely as a result of a rotator cuff tear.  I suspect that surgical intervention will be necessary.  Therefore, we will proceed with presurgical x-rays and MRI of the shoulder.  Of note, she does have a Port-A-Cath and I do not think that is a contraindication for MRI but she will check with her oncologist just to be sure.  As long as she is able to proceed with MRI, I will call her with those results when available.  I did provide her with a list of preferred shoulder surgeons in the [place] area.

## 2019-12-22 ENCOUNTER — Ambulatory Visit: Payer: Managed Care, Other (non HMO)

## 2019-12-22 ENCOUNTER — Other Ambulatory Visit: Payer: Managed Care, Other (non HMO)

## 2019-12-23 NOTE — Progress Notes (Signed)

## 2019-12-29 ENCOUNTER — Other Ambulatory Visit: Payer: Self-pay

## 2019-12-29 ENCOUNTER — Ambulatory Visit
Admission: RE | Admit: 2019-12-29 | Discharge: 2019-12-29 | Disposition: A | Discharge status: Home / Self Care | Payer: Managed Care, Other (non HMO) | Source: Ambulatory Visit | Attending: Sports Medicine | Admitting: Sports Medicine

## 2019-12-29 ENCOUNTER — Inpatient Hospital Stay: Payer: Managed Care, Other (non HMO) | Attending: Oncology

## 2019-12-29 ENCOUNTER — Inpatient Hospital Stay: Payer: Managed Care, Other (non HMO)

## 2019-12-29 VITALS — BP 154/74 | HR 72 | Temp 98.9°F | Resp 18 | Wt 161.5 lb

## 2019-12-29 DIAGNOSIS — Z5112 Encounter for antineoplastic immunotherapy: Secondary | ICD-10-CM | POA: Insufficient documentation

## 2019-12-29 DIAGNOSIS — M25511 Pain in right shoulder: Secondary | ICD-10-CM

## 2019-12-29 DIAGNOSIS — Z170 Estrogen receptor positive status [ER+]: Secondary | ICD-10-CM | POA: Insufficient documentation

## 2019-12-29 DIAGNOSIS — Z79899 Other long term (current) drug therapy: Secondary | ICD-10-CM | POA: Diagnosis not present

## 2019-12-29 DIAGNOSIS — C50512 Malignant neoplasm of lower-outer quadrant of left female breast: Secondary | ICD-10-CM | POA: Insufficient documentation

## 2019-12-29 DIAGNOSIS — Z95828 Presence of other vascular implants and grafts: Secondary | ICD-10-CM

## 2019-12-29 LAB — CBC WITH DIFFERENTIAL/PLATELET
Abs Immature Granulocytes: 0.01 10*3/uL (ref 0.00–0.07)
Basophils Absolute: 0.1 10*3/uL (ref 0.0–0.1)
Basophils Relative: 1 %
Eosinophils Absolute: 0.1 10*3/uL (ref 0.0–0.5)
Eosinophils Relative: 2 %
HCT: 32.8 % — ABNORMAL LOW (ref 36.0–46.0)
Hemoglobin: 11 g/dL — ABNORMAL LOW (ref 12.0–15.0)
Immature Granulocytes: 0 %
Lymphocytes Relative: 17 %
Lymphs Abs: 0.9 10*3/uL (ref 0.7–4.0)
MCH: 30.6 pg (ref 26.0–34.0)
MCHC: 33.5 g/dL (ref 30.0–36.0)
MCV: 91.1 fL (ref 80.0–100.0)
Monocytes Absolute: 0.5 10*3/uL (ref 0.1–1.0)
Monocytes Relative: 8 %
Neutro Abs: 3.9 10*3/uL (ref 1.7–7.7)
Neutrophils Relative %: 72 %
Platelets: 237 10*3/uL (ref 150–400)
RBC: 3.6 MIL/uL — ABNORMAL LOW (ref 3.87–5.11)
RDW: 12.6 % (ref 11.5–15.5)
WBC: 5.4 10*3/uL (ref 4.0–10.5)
nRBC: 0 % (ref 0.0–0.2)

## 2019-12-29 LAB — COMPREHENSIVE METABOLIC PANEL
ALT: 17 U/L (ref 0–44)
AST: 24 U/L (ref 15–41)
Albumin: 3.7 g/dL (ref 3.5–5.0)
Alkaline Phosphatase: 34 U/L — ABNORMAL LOW (ref 38–126)
Anion gap: 8 (ref 5–15)
BUN: 16 mg/dL (ref 8–23)
CO2: 25 mmol/L (ref 22–32)
Calcium: 8.8 mg/dL — ABNORMAL LOW (ref 8.9–10.3)
Chloride: 101 mmol/L (ref 98–111)
Creatinine, Ser: 0.76 mg/dL (ref 0.44–1.00)
GFR calc Af Amer: >60 mL/min (ref >60)
GFR calc non Af Amer: >60 mL/min (ref >60)
Glucose, Bld: 92 mg/dL (ref 70–99)
Potassium: 4.1 mmol/L (ref 3.5–5.1)
Sodium: 134 mmol/L — ABNORMAL LOW (ref 135–145)
Total Bilirubin: 0.3 mg/dL (ref 0.3–1.2)
Total Protein: 6.9 g/dL (ref 6.5–8.1)

## 2019-12-29 MED ORDER — SODIUM CHLORIDE 0.9 % IV SOLN
Freq: Once | INTRAVENOUS | Status: AC
  Filled 2019-12-29: qty 250

## 2019-12-29 MED ORDER — DIPHENHYDRAMINE HCL 25 MG PO CAPS
25.0000 mg | ORAL_CAPSULE | Freq: Once | ORAL | Status: AC
  Administered 2019-12-29: 25 mg via ORAL

## 2019-12-29 MED ORDER — HEPARIN SOD (PORK) LOCK FLUSH 100 UNIT/ML IV SOLN
500.0000 [IU] | Freq: Once | INTRAVENOUS | Status: AC | PRN
  Administered 2019-12-29: 500 [IU]
  Filled 2019-12-29: qty 5

## 2019-12-29 MED ORDER — TRASTUZUMAB-DKST CHEMO 150 MG IV SOLR
450.0000 mg | Freq: Once | INTRAVENOUS | Status: AC
  Administered 2019-12-29: 450 mg via INTRAVENOUS
  Filled 2019-12-29: qty 21.43

## 2019-12-29 MED ORDER — DIPHENHYDRAMINE HCL 25 MG PO CAPS
ORAL_CAPSULE | ORAL | Status: AC
  Filled 2019-12-29: qty 1

## 2019-12-29 MED ORDER — ACETAMINOPHEN 325 MG PO TABS
ORAL_TABLET | ORAL | Status: AC
  Filled 2019-12-29: qty 2

## 2019-12-29 MED ORDER — SODIUM CHLORIDE 0.9% FLUSH
10.0000 mL | INTRAVENOUS | Status: DC | PRN
  Administered 2019-12-29 (×2): 10 mL
  Filled 2019-12-29: qty 10

## 2019-12-29 MED ORDER — SODIUM CHLORIDE 0.9% FLUSH
10.0000 mL | INTRAVENOUS | Status: DC | PRN
  Filled 2019-12-29: qty 10

## 2019-12-29 MED ORDER — ACETAMINOPHEN 325 MG PO TABS
650.0000 mg | ORAL_TABLET | Freq: Once | ORAL | Status: AC
  Administered 2019-12-29: 650 mg via ORAL

## 2019-12-29 NOTE — Patient Instructions (Signed)
[facility] Discharge Instructions for Patients Receiving Chemotherapy  Today you received the following Immunotherapy agent: Trastuzumab -dkst (OGIVRI)  To help prevent nausea and vomiting after your treatment, we encourage you to take your nausea medication as directed by your MD.   If you develop nausea and vomiting that is not controlled by your nausea medication, call the clinic.   BELOW ARE SYMPTOMS THAT SHOULD BE REPORTED IMMEDIATELY:  *FEVER GREATER THAN 100.5 F  *CHILLS WITH OR WITHOUT FEVER  NAUSEA AND VOMITING THAT IS NOT CONTROLLED WITH YOUR NAUSEA MEDICATION  *UNUSUAL SHORTNESS OF BREATH  *UNUSUAL BRUISING OR BLEEDING  TENDERNESS IN MOUTH AND THROAT WITH OR WITHOUT PRESENCE OF ULCERS  *URINARY PROBLEMS  *BOWEL PROBLEMS  UNUSUAL RASH Items with * indicate a potential emergency and should be followed up as soon as possible.  Feel free to call the clinic should you have any questions or concerns. The clinic phone number is [phone].  Please show the [facility] at check-in to the Emergency Department and triage nurse.  Coronavirus (COVID-19) Are you at risk?  Are you at risk for the Coronavirus (COVID-19)?  To be considered HIGH RISK for Coronavirus (COVID-19), you have to meet the following criteria:  . Traveled to [place], [place], [place], [place] or [place]; or in the [place] to [place], [place], [place], or [place]; and have fever, cough, and shortness of breath within the last 2 weeks of travel OR . Been in close contact with a person diagnosed with COVID-19 within the last 2 weeks and have fever, cough, and shortness of breath . IF YOU DO NOT MEET THESE CRITERIA, YOU ARE CONSIDERED LOW RISK FOR COVID-19.  What to do if you are HIGH RISK for COVID-19?  Marland Kitchen If you are having a medical emergency, call 911. . Seek medical care right away. Before you go to a doctor's office, urgent care or emergency department, call ahead  and tell them about your recent travel, contact with someone diagnosed with COVID-19, and your symptoms. You should receive instructions from your physician's office regarding next steps of care.  . When you arrive at healthcare provider, tell the healthcare staff immediately you have returned from visiting [place], [place], [place], [place] or [place]; or traveled in the [place] to [place], [place], [place], or [place]; in the last two weeks or you have been in close contact with a person diagnosed with COVID-19 in the last 2 weeks.   . Tell the health care staff about your symptoms: fever, cough and shortness of breath. . After you have been seen by a medical provider, you will be either: o Tested for (COVID-19) and discharged home on quarantine except to seek medical care if symptoms worsen, and asked to  - Stay home and avoid contact with others until you get your results (4-5 days)  - Avoid travel on public transportation if possible (such as bus, train, or airplane) or o Sent to the Emergency Department by EMS for evaluation, COVID-19 testing, and possible admission depending on your condition and test results.  What to do if you are LOW RISK for COVID-19?  Reduce your risk of any infection by using the same precautions used for avoiding the common cold or flu:  Marland Kitchen Wash your hands often with soap and warm water for at least 20 seconds.  If soap and water are not readily available, use an alcohol-based hand sanitizer with at least 60% alcohol.  Marland Kitchen  If coughing or sneezing, cover your mouth and nose by coughing or sneezing into the elbow areas of your shirt or coat, into a tissue or into your sleeve (not your hands). . Avoid shaking hands with others and consider head nods or verbal greetings only. . Avoid touching your eyes, nose, or mouth with unwashed hands.  . Avoid close contact with people who are sick. . Avoid places or events with large numbers of people in one location, like  concerts or sporting events. . Carefully consider travel plans you have or are making. . If you are planning any travel outside or inside the [place], visit the CDC's Travelers' Health webpage for the latest health notices. . If you have some symptoms but not all symptoms, continue to monitor at home and seek medical attention if your symptoms worsen. . If you are having a medical emergency, call 911.   ADDITIONAL HEALTHCARE OPTIONS FOR PATIENTS  [facility] / e-Visit: [email or web address]         [facility]: [phone]  [facility]: [phone]                   [facility]: [phone]  

## 2020-01-11 ENCOUNTER — Encounter: Payer: Self-pay | Admitting: *Deleted

## 2020-01-12 ENCOUNTER — Ambulatory Visit: Payer: Managed Care, Other (non HMO)

## 2020-01-12 ENCOUNTER — Other Ambulatory Visit: Payer: Managed Care, Other (non HMO)

## 2020-01-12 ENCOUNTER — Ambulatory Visit: Payer: Managed Care, Other (non HMO) | Admitting: Oncology

## 2020-01-18 ENCOUNTER — Other Ambulatory Visit: Payer: Self-pay | Admitting: Oncology

## 2020-01-18 ENCOUNTER — Telehealth: Payer: Self-pay | Admitting: *Deleted

## 2020-01-18 ENCOUNTER — Other Ambulatory Visit: Payer: Self-pay

## 2020-01-18 ENCOUNTER — Ambulatory Visit
Admission: RE | Admit: 2020-01-18 | Discharge: 2020-01-18 | Disposition: A | Discharge status: Home / Self Care | Payer: Managed Care, Other (non HMO) | Source: Ambulatory Visit | Attending: Sports Medicine | Admitting: Sports Medicine

## 2020-01-18 DIAGNOSIS — M25511 Pain in right shoulder: Secondary | ICD-10-CM

## 2020-01-18 NOTE — Progress Notes (Signed)
[place]  [phone] [phone]     ID: Allison Hanna DOB: 1961/04/13  MR#: [id number]  HGD#:[id number]  Patient Care Team: Merrilee Seashore, MD as PCP - General (Internal Medicine) Mauro Kaufmann, RN as Oncology Nurse Navigator Rockwell Germany, RN as Oncology Nurse Navigator Millee Denise, Virgie Dad, MD as Consulting Physician (Oncology) Gery Pray, MD as Consulting Physician (Radiation Oncology) Fanny Skates, MD as Consulting Physician (General Surgery) Brien Few, MD as Consulting Physician (Obstetrics and Gynecology) Larey Dresser, MD as Consulting Physician (Cardiology) Feliberto Harts R (Inactive) Felipa Furnace, DPM as Consulting Physician (Podiatry) Rolm Bookbinder, MD as Consulting Physician (Dermatology) Thurman Coyer, DO as Consulting Physician (Sports Medicine) Chauncey Cruel, MD OTHER MD:  CHIEF COMPLAINT: Estrogen receptor positive breast cancer  CURRENT TREATMENT: trastuzumab, tamoxifen   INTERVAL HISTORY: Allison Hanna returns today for follow-up and treatment of her estrogen receptor positive breast cancer. She contacted our office yesterday, 01/18/2020, with concerns of left rib/chest wall and breast pain with some associated swelling.  She continues on adjuvant trastuzumab ([facility]ri).  She underwent echocardiogram on 11/19/2019 showing an ejection fraction of 60-65%, which is stable from prior in 07/2019.  She continues on Tamoxifen.  She tolerates this with no significant side effects other than mild hot flashes.  She is using Estrace cream 2-3 times per week and notes that her vaginal dryness and atrophy is much improved.   Her annual diagnostic mammogram is scheduled for 02/24/2020.  She has been followed by Dr. Micheline Chapman for chronic right shoulder pain. She underwent MRI yesterday, 01/18/2020, showing: mild supraspinatus tendinosis with low-grade partial thickness articular surface tear of the mid to distal tendon;  mild acromioclavicular and glenohumeral osteoarthritis.   REVIEW OF SYSTEMS: Allison Hanna is concerned because she has pain in the left axilla.  This is of course where she had the surgery.  She describes it as in the rib cage.  Also the under part of the left breast seems to her little bit swollen.  Is not tender.  Otherwise she is working full-time, she had both doses of [employer] vaccine and tolerated them well.  She did fine with her treatment today, which she received before the visit.  She is very apologetic at calling with symptoms and describes herself as a hypochondriac but I reassured her that really that is not the case at all.  She is simply vulnerable as our patients with a history of cancer.   HISTORY OF CURRENT ILLNESS: From the original intake note:  "Allison Hanna" presented with a palpable left breast mass. She initially experienced soreness to her left breast around April 2020 and tried to get a mammogram at this point, but due to pandemic concerns, the scan was delayed.  She did not actually feel a mass in the breast until June 1.  She underwent bilateral diagnostic mammography with tomography and left breast ultrasonography at [facility] on 02/11/2019 showing: breast density category B; irregular hyperechoic mass in the left breast at 4 o'clock 5 cm from the nipple measuring 8 mm; no enlarged adenopathy in the left axilla.  Accordingly on 02/12/2019 she proceeded to biopsy of the left breast area in question. The pathology from this procedure ([id number]) showed: invasive mammary carcinoma, e-cadherin positive. Prognostic indicators significant for: estrogen receptor, 100% positive with strong staining intensity and progesterone receptor, 0% negative. Proliferation marker Ki67 at 25%. HER2 positive by immunohistochemistry, (3+).  The patient's subsequent history is as detailed below.   PAST MEDICAL  HISTORY: Past Medical History:  Diagnosis Date  . Anxiety   . Family history of  breast cancer   . Family history of melanoma   . High cholesterol   . Hypothyroid   . Paralyzed vocal cords 2014  . Raynauds syndrome    cold weather triggers fingers to turn white  History of palpitations (resolved)   PAST SURGICAL HISTORY: Past Surgical History:  Procedure Laterality Date  . BREAST LUMPECTOMY WITH RADIOACTIVE SEED AND SENTINEL LYMPH NODE BIOPSY Left 03/05/2019   Procedure: LEFT BREAST LUMPECTOMY WITH RADIOACTIVE SEED AND LEFT AXILLARY DEEP SENTINEL LYMPH NODE BIOPSY INJECT BLUE DYE LEFT BREAST;  Surgeon: Fanny Skates, MD;  Location: [facility];  Service: General;  Laterality: Left;  . KNEE SURGERY Bilateral    arthroscopy for menicus tears  . PORTACATH PLACEMENT Right 03/05/2019   Procedure: INSERTION PORT-A-CATH;  Surgeon: Fanny Skates, MD;  Location: [facility];  Service: General;  Laterality: Right;  . WRIST SURGERY      FAMILY HISTORY: Family History  Problem Relation Age of Onset  . Breast cancer Mother 78  . Cervical cancer Mother   . Heart attack Father   . Skin cancer Brother   . Breast cancer Paternal Aunt        dx over 23s  . Breast cancer Paternal Aunt        dx over 25  . Uterine cancer Paternal Aunt 40  . Cervical cancer Maternal Aunt   . Liver cancer Paternal Uncle 1  . Stroke Maternal Grandmother   . Parkinson's disease Paternal Grandfather   . Breast cancer Cousin 18       mother's maternal first cousin  . Melanoma Cousin 59       pat first cousin  Patient's father was 79 years old when he died from heart attack. Patient's mother died at age 73. She was diagnosed with breast cancer at age 67. Two paternal aunts were diagnosed with breast cancer at older ages, one of which also had uterine cancer. She has 1 brother. He has a history of skin cancer.    GYNECOLOGIC HISTORY:  No LMP recorded. Patient is postmenopausal. Menarche: 59 years old Age at first live birth: 59 years old [place] P 3 LMP  2007 Contraceptive: yes, pill for about 8 years, no complications HRT no  Hysterectomy? no BSO? 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 She is married. [name] is in charge of operations for [employer]. She lives at home with her husband. [name], age 57, lives in [place], [place] as a Research scientist (life sciences). [name], age 35, lives in [place] as a Careers adviser. [name], age 31, lives in [place] as a Ship broker and works part-time in [employer]. She has no grandchildren.     ADVANCED DIRECTIVES: [name] is automatically her HCPOA.   HEALTH MAINTENANCE: Social History   Tobacco Use  . Smoking status: Never Smoker  . Smokeless tobacco: Never Used  Substance Use Topics  . Alcohol use: Yes    Alcohol/week: 2.0 standard drinks    Types: 2 Standard drinks or equivalent per week  . Drug use: Never     Colonoscopy: 2010  PAP: 08/2018  Bone density: 2019, osteopenia   No Known Allergies  Current Outpatient Medications  Medication Sig Dispense Refill  . B Complex-Folic Acid (SUPER B COMPLEX MAXI) TABS Take by mouth.    Marland Kitchen  calcium carbonate (OS-CAL) 600 MG TABS tablet Take 1,200 mg by mouth 2 (two) times daily with a meal.    . Cholecalciferol (VITAMIN D3) 25 MCG (1000 UT) CAPS Take 2,000 Units by mouth daily.    Marland Kitchen estradiol (ESTRACE VAGINAL) 0.1 MG/GM vaginal cream Place 1 Applicatorful vaginally at bedtime. 42.5 g 12  . estradiol (ESTRING) 2 MG vaginal ring Place 2 mg vaginally every 3 (three) months. follow package directions 1 each 12  . Levothyroxine Sodium (SYNTHROID PO) Take 75 mcg by mouth daily.     . Magnesium 250 MG TABS Take 250 mg by mouth daily.    . nitroGLYCERIN (NITRODUR - DOSED IN MG/24 HR) 0.2 mg/hr patch USE 1/4 PATCH DAILY TO THE AFFECTED AREA. 30 patch 1  . rosuvastatin (CRESTOR) 5 MG tablet Take 1 tablet (5 mg total) by mouth daily. 30  tablet 12  . tretinoin (RETIN-A) 8.92 % cream APPLICATIONS APPLY A PEA SIZE TO FACE NIGHTLY AS DIRECTED    . vitamin C (ASCORBIC ACID) 500 MG tablet Take 500 mg by mouth daily.     No current facility-administered medications for this visit.   Facility-Administered Medications Ordered in Other Visits  Medication Dose Route Frequency Provider Last Rate Last Admin  . sodium chloride flush (NS) 0.9 % injection 10 mL  10 mL Intracatheter PRN Casyn Becvar, Virgie Dad, MD        OBJECTIVE: White woman who appears well  Vitals:   01/19/20 1116  BP: (!) 151/98  Pulse: (!) 59  Resp: 18  Temp: 98.2 F (36.8 C)  SpO2: 99%   Wt Readings from Last 3 Encounters:  01/19/20 164 lb 8 oz (74.6 kg)  01/19/20 164 lb 9.6 oz (74.7 kg)  12/29/19 161 lb 8 oz (73.3 kg)   Body mass index is 25.78 kg/m.    ECOG FS:1 - Symptomatic but completely ambulatory  Sclerae unicteric, EOMs intact Wearing a mask No cervical or supraclavicular adenopathy Lungs no rales or rhonchi Heart regular rate and rhythm Abd soft, nontender, positive bowel sounds MSK no focal spinal tenderness, no upper extremity lymphedema Neuro: nonfocal, well oriented, appropriate affect Breasts: The right breast is unremarkable.  The left breast is status post lumpectomy and radiation.  The inferior part of the breast shows some changes secondary to radiation, with skin thickening and mild hyperpigmentation.  This is unremarkable.  The left axilla also shows no cysts suggestion of local recurrence.  Palpation of the rib cage in that area is nontender.  LAB RESULTS:  CMP     Component Value Date/Time   NA 135 01/19/2020 0938   K 4.1 01/19/2020 0938   CL 103 01/19/2020 0938   CO2 27 01/19/2020 0938   GLUCOSE 123 (H) 01/19/2020 0938   BUN 19 01/19/2020 0938   CREATININE 0.81 01/19/2020 0938   CREATININE 0.81 05/12/2019 1145   CALCIUM 8.7 (L) 01/19/2020 0938   PROT 6.6 01/19/2020 0938   ALBUMIN 3.5 01/19/2020 0938   AST 18  01/19/2020 0938   AST 19 05/12/2019 1145   ALT 15 01/19/2020 0938   ALT 16 05/12/2019 1145   ALKPHOS 36 (L) 01/19/2020 0938   BILITOT 0.3 01/19/2020 0938   BILITOT 0.3 05/12/2019 1145   GFRNONAA >60 01/19/2020 0938   GFRNONAA >60 05/12/2019 1145   GFRAA >60 01/19/2020 0938   GFRAA >60 05/12/2019 1145    No results found for: TOTALPROTELP, ALBUMINELP, A1GS, A2GS, BETS, BETA2SER, GAMS, MSPIKE, SPEI  No results found for: KPAFRELGTCHN, LAMBDASER,  [facility]  Lab Results  Component Value Date   WBC 4.2 01/19/2020   NEUTROABS 3.1 01/19/2020   HGB 11.4 (L) 01/19/2020   HCT 34.2 (L) 01/19/2020   MCV 92.7 01/19/2020   PLT 229 01/19/2020    No results found for: LABCA2  No components found for: LNZVJK820  No results for input(s): INR in the last 168 hours.  No results found for: LABCA2  No results found for: UOR561  No results found for: BPP943  No results found for: EXM147  No results found for: CA2729  No components found for: HGQUANT  No results found for: CEA1 / No results found for: CEA1   No results found for: AFPTUMOR  No results found for: CHROMOGRNA  No results found for: HGBA, HGBA2QUANT, HGBFQUANT, HGBSQUAN (Hemoglobinopathy evaluation)   No results found for: LDH  No results found for: IRON, TIBC, IRONPCTSAT (Iron and TIBC)  No results found for: FERRITIN  Urinalysis No results found for: COLORURINE, APPEARANCEUR, LABSPEC, PHURINE, GLUCOSEU, HGBUR, BILIRUBINUR, KETONESUR, PROTEINUR, UROBILINOGEN, NITRITE, LEUKOCYTESUR   STUDIES: DG Shoulder Right  Result Date: 12/30/2019 CLINICAL DATA:  Pain and decreased range of motion EXAM: RIGHT SHOULDER - 2+ VIEW COMPARISON:  None. FINDINGS: Oblique, Y scapular, and axillary images were obtained. No fracture or dislocation. There is slight generalized joint space narrowing. No erosive change or intra-articular calcification. Port-A-Cath tip in superior vena cava. Visualized right lung clear. IMPRESSION:  Mild generalized osteoarthritic change.  No fracture or dislocation. Electronically Signed   By: Lowella Grip III M.D.   On: 12/30/2019 09:02   MR SHOULDER RIGHT WO CONTRAST  Result Date: 01/18/2020 CLINICAL DATA:  Chronic right shoulder pain and limited range of motion. No injury or prior surgery. EXAM: MRI OF THE RIGHT SHOULDER WITHOUT CONTRAST TECHNIQUE: Multiplanar, multisequence MR imaging of the shoulder was performed. No intravenous contrast was administered. COMPARISON:  Right shoulder x-rays dated Dec 29, 2019. FINDINGS: Rotator cuff: Mild supraspinatus tendinosis with low-grade partial thickness articular surface tear of the mid to distal tendon. The infraspinatus, teres minor, and subscapularis tendons are unremarkable. Muscles: No atrophy or abnormal signal of the muscles of the rotator cuff. Biceps long head:  Intact and normally positioned. Acromioclavicular Joint: Mild arthropathy of the acromioclavicular joint. Type I acromion. Trace fluid in the subacromial/subdeltoid bursa. Glenohumeral Joint: Scattered mild partial-thickness cartilage loss with small marginal osteophytes. Trace joint effusion. Labrum: Grossly intact, but evaluation is limited by lack of intraarticular fluid. Bones: No acute fracture or dislocation. No suspicious bone lesion. Reactive subcortical cystic change in the greater tuberosity. Other: None. IMPRESSION: 1. Mild supraspinatus tendinosis with low-grade partial thickness articular surface tear of the mid to distal tendon. 2. Mild acromioclavicular and glenohumeral osteoarthritis. Electronically Signed   By: Titus Dubin M.D.   On: 01/18/2020 10:12    ELIGIBLE FOR AVAILABLE RESEARCH PROTOCOL: no  ASSESSMENT: 59 y.o. Allison Hanna woman status post left breast biopsy 02/12/2019 for a clinical T1b N0, stage IA invasive ductal carcinoma, estrogen receptor positive, progesterone receptor negative, HER-2 amplified, with an MIB-1-1 of 25%  (1) status post left  lumpectomy and sentinel lymph node sampling 03/05/2019 for a pT1c pN0, stage IA invasive ductal carcinoma, grade 2, with positive lymphovascular invasion but negative margins  (a) a total of 8 lymph nodes removed (4 sentinel)   (2) adjuvant chemotherapy with paclitaxel and trastuzumab weekly x12 started 04/07/2019  (a) paclitaxel stopped after 4 doses because of peripheral neuropathy  (b) cyclophosphamide, methotrexate, fluorouracil started 05/12/2019, to be repeated q.  21 days x 3  (3) trastuzumab to be continued to complete 1 year (through August 2021)  (a) echo 02/26/2019 shows an ejection fraction in the 60-65% range  (b) echo 05/27/2019 shows an EF of 55-60%  (c) echo 08/07/2019 shows an ejection fraction in the 60-65% range  (d) echo 11/19/2019 shows an ejection fraction in the 60-65% range  (4) adjuvant radiation 07/15/2019 - 08/13/2019  (a) left breast / 40.05 Gy in 15 fractions  (b) boost / 12 Gy in 6 fractions  (5) tamoxifen started 10/17/2019  (6) genetics testing 02/25/2019 through the Multi-Gene Panel offered by [facility] found no deleterious mutations in AIP, ALK, APC, ATM, AXIN2,BAP1,  BARD1, BLM, BMPR1A, BRCA1, BRCA2, BRIP1, CASR, CDC73, CDH1, CDK4, CDKN1B, CDKN1C, CDKN2A (p14ARF), CDKN2A (p16INK4a), CEBPA, CHEK2, CTNNA1, DICER1, DIS3L2, EGFR (c.2369C>T, p.Thr790Met variant only), EPCAM (Deletion/duplication testing only), FH, FLCN, GATA2, GPC3, GREM1 (Promoter region deletion/duplication testing only), HOXB13 (c.251G>A, p.Gly84Glu), HRAS, KIT, MAX, MEN1, MET, MITF (c.952G>A, p.Glu318Lys variant only), MLH1, MSH2, MSH3, MSH6, MUTYH, NBN, NF1, NF2, NTHL1, PALB2, PDGFRA, PHOX2B, PMS2, POLD1, POLE, POT1, PRKAR1A, PTCH1, PTEN, RAD50, RAD51C, RAD51D, RB1, RECQL4, RET, RNF43, RUNX1, SDHAF2, SDHA (sequence changes only), SDHB, SDHC, SDHD, SMAD4, SMARCA4, SMARCB1, SMARCE1, STK11, SUFU, TERC, TERT, TMEM127, TP53, TSC1, TSC2, VHL, WRN and WT1.     PLAN: Allison Hanna is tolerating trastuzumab  well and the plan is to continue that through mid August.  She will need a repeat echocardiogram sometime late June.  I reassured her that I do not feel anything unusual in her left breast or left axilla.  We discussed perhaps doing a simple chest x-ray or left rib films but I do not think that is necessary and she was reassured by this discussion.  I do not think she needs to see me in June as already scheduled since we had this visit today.  I will cancel that visit and see her at the end in August when she has her last dose of trastuzumab  She knows to call for any other issue that may develop before the next visit  Total encounter time 25 minutes.Sarajane Jews C. Liberta Gimpel, MD 01/19/20 11:32 AM Medical Oncology and Hematology [facility] [place], [place] [address] Tel. [phone]    Fax. [phone]   I, Wilburn Mylar, am acting as scribe for Dr. Virgie Dad. Reiana Poteet.  I, [name] MD, have reviewed the above documentation for accuracy and completeness, and I agree with the above.   *Total Encounter Time as defined by the Centers for Medicare and Medicaid Services includes, in addition to the face-to-face time of a patient visit (documented in the note above) non-face-to-face time: obtaining and reviewing outside history, ordering and reviewing medications, tests or procedures, care coordination (communications with other health care professionals or caregivers) and documentation in the medical record.

## 2020-01-18 NOTE — Telephone Encounter (Signed)
Pt called and c/o left rib/chest wall and breast pain. She has noted some breast swelling. No discreet masses noted by pt. Drs. Kinard and Elliston notified. Will be seen during tx to assess breast changes. Received verbal understanding from pt.

## 2020-01-19 ENCOUNTER — Inpatient Hospital Stay: Payer: Managed Care, Other (non HMO)

## 2020-01-19 ENCOUNTER — Inpatient Hospital Stay: Payer: Managed Care, Other (non HMO) | Admitting: Oncology

## 2020-01-19 ENCOUNTER — Other Ambulatory Visit: Payer: Self-pay

## 2020-01-19 VITALS — BP 131/88 | HR 71 | Temp 98.1°F | Resp 18 | Wt 164.5 lb

## 2020-01-19 VITALS — BP 151/98 | HR 59 | Temp 98.2°F | Resp 18 | Ht 67.0 in | Wt 164.6 lb

## 2020-01-19 DIAGNOSIS — C50512 Malignant neoplasm of lower-outer quadrant of left female breast: Secondary | ICD-10-CM | POA: Diagnosis not present

## 2020-01-19 DIAGNOSIS — Z95828 Presence of other vascular implants and grafts: Secondary | ICD-10-CM

## 2020-01-19 DIAGNOSIS — Z170 Estrogen receptor positive status [ER+]: Secondary | ICD-10-CM | POA: Diagnosis not present

## 2020-01-19 DIAGNOSIS — Z5112 Encounter for antineoplastic immunotherapy: Secondary | ICD-10-CM | POA: Diagnosis not present

## 2020-01-19 LAB — COMPREHENSIVE METABOLIC PANEL
ALT: 15 U/L (ref 0–44)
AST: 18 U/L (ref 15–41)
Albumin: 3.5 g/dL (ref 3.5–5.0)
Alkaline Phosphatase: 36 U/L — ABNORMAL LOW (ref 38–126)
Anion gap: 5 (ref 5–15)
BUN: 19 mg/dL (ref 8–23)
CO2: 27 mmol/L (ref 22–32)
Calcium: 8.7 mg/dL — ABNORMAL LOW (ref 8.9–10.3)
Chloride: 103 mmol/L (ref 98–111)
Creatinine, Ser: 0.81 mg/dL (ref 0.44–1.00)
GFR calc Af Amer: >60 mL/min (ref >60)
GFR calc non Af Amer: >60 mL/min (ref >60)
Glucose, Bld: 123 mg/dL — ABNORMAL HIGH (ref 70–99)
Potassium: 4.1 mmol/L (ref 3.5–5.1)
Sodium: 135 mmol/L (ref 135–145)
Total Bilirubin: 0.3 mg/dL (ref 0.3–1.2)
Total Protein: 6.6 g/dL (ref 6.5–8.1)

## 2020-01-19 LAB — CBC WITH DIFFERENTIAL/PLATELET
Abs Immature Granulocytes: 0.01 10*3/uL (ref 0.00–0.07)
Basophils Absolute: 0.1 10*3/uL (ref 0.0–0.1)
Basophils Relative: 2 %
Eosinophils Absolute: 0.1 10*3/uL (ref 0.0–0.5)
Eosinophils Relative: 2 %
HCT: 34.2 % — ABNORMAL LOW (ref 36.0–46.0)
Hemoglobin: 11.4 g/dL — ABNORMAL LOW (ref 12.0–15.0)
Immature Granulocytes: 0 %
Lymphocytes Relative: 14 %
Lymphs Abs: 0.6 10*3/uL — ABNORMAL LOW (ref 0.7–4.0)
MCH: 30.9 pg (ref 26.0–34.0)
MCHC: 33.3 g/dL (ref 30.0–36.0)
MCV: 92.7 fL (ref 80.0–100.0)
Monocytes Absolute: 0.3 10*3/uL (ref 0.1–1.0)
Monocytes Relative: 8 %
Neutro Abs: 3.1 10*3/uL (ref 1.7–7.7)
Neutrophils Relative %: 74 %
Platelets: 229 10*3/uL (ref 150–400)
RBC: 3.69 MIL/uL — ABNORMAL LOW (ref 3.87–5.11)
RDW: 12.5 % (ref 11.5–15.5)
WBC: 4.2 10*3/uL (ref 4.0–10.5)
nRBC: 0 % (ref 0.0–0.2)

## 2020-01-19 MED ORDER — TRASTUZUMAB-DKST CHEMO 150 MG IV SOLR
450.0000 mg | Freq: Once | INTRAVENOUS | Status: AC
  Administered 2020-01-19: 450 mg via INTRAVENOUS
  Filled 2020-01-19: qty 21.43

## 2020-01-19 MED ORDER — DIPHENHYDRAMINE HCL 25 MG PO CAPS
ORAL_CAPSULE | ORAL | Status: AC
  Filled 2020-01-19: qty 1

## 2020-01-19 MED ORDER — HEPARIN SOD (PORK) LOCK FLUSH 100 UNIT/ML IV SOLN
500.0000 [IU] | Freq: Once | INTRAVENOUS | Status: AC | PRN
  Administered 2020-01-19: 500 [IU]
  Filled 2020-01-19: qty 5

## 2020-01-19 MED ORDER — SODIUM CHLORIDE 0.9% FLUSH
10.0000 mL | INTRAVENOUS | Status: DC | PRN
  Administered 2020-01-19: 10 mL
  Filled 2020-01-19: qty 10

## 2020-01-19 MED ORDER — DIPHENHYDRAMINE HCL 25 MG PO CAPS
25.0000 mg | ORAL_CAPSULE | Freq: Once | ORAL | Status: AC
  Administered 2020-01-19: 25 mg via ORAL

## 2020-01-19 MED ORDER — ACETAMINOPHEN 325 MG PO TABS
ORAL_TABLET | ORAL | Status: AC
  Filled 2020-01-19: qty 2

## 2020-01-19 MED ORDER — SODIUM CHLORIDE 0.9 % IV SOLN
Freq: Once | INTRAVENOUS | Status: AC
  Filled 2020-01-19: qty 250

## 2020-01-19 MED ORDER — ACETAMINOPHEN 325 MG PO TABS
650.0000 mg | ORAL_TABLET | Freq: Once | ORAL | Status: AC
  Administered 2020-01-19: 650 mg via ORAL

## 2020-01-19 NOTE — Patient Instructions (Signed)
[facility] Discharge Instructions for Patients Receiving Chemotherapy  Today you received the following chemotherapy agents trastuzumab.  To help prevent nausea and vomiting after your treatment, we encourage you to take your nausea medication as directed.    If you develop nausea and vomiting that is not controlled by your nausea medication, call the clinic.   BELOW ARE SYMPTOMS THAT SHOULD BE REPORTED IMMEDIATELY:  *FEVER GREATER THAN 100.5 F  *CHILLS WITH OR WITHOUT FEVER  NAUSEA AND VOMITING THAT IS NOT CONTROLLED WITH YOUR NAUSEA MEDICATION  *UNUSUAL SHORTNESS OF BREATH  *UNUSUAL BRUISING OR BLEEDING  TENDERNESS IN MOUTH AND THROAT WITH OR WITHOUT PRESENCE OF ULCERS  *URINARY PROBLEMS  *BOWEL PROBLEMS  UNUSUAL RASH Items with * indicate a potential emergency and should be followed up as soon as possible.  Feel free to call the clinic should you have any questions or concerns. The clinic phone number is [phone].  Please show the CHEMO ALERT CARD at check-in to the Emergency Department and triage nurse.   

## 2020-01-20 ENCOUNTER — Telehealth: Payer: Self-pay | Admitting: Sports Medicine

## 2020-01-20 ENCOUNTER — Telehealth: Payer: Self-pay | Admitting: Oncology

## 2020-01-20 NOTE — Telephone Encounter (Signed)
  I spoke with Allison Hanna on the phone today after reviewing MRI findings of her right shoulder.  The MRI confirms what was seen on ultrasound.  She has a partial-thickness articular surface tear of the mid to distal supraspinatus tendon.  She has had symptoms for well over a year and has failed exhaustive conservative treatment including treatment with topical nitroglycerin patches and formal physical therapy.  She would be interested in discussing merits of arthroscopic rotator cuff debridement.  Therefore, I will refer her to Dr. Tamera Punt at [facility] to discuss this further.  I will defer further work-up and treatment to the discretion of Dr. Tamera Punt and the patient will follow up with me as needed.

## 2020-01-20 NOTE — Telephone Encounter (Signed)
Scheduled appts per 5/25 los. Called pt on mobil and house phone. Both voicemail boxes were full.

## 2020-02-01 ENCOUNTER — Telehealth: Payer: Self-pay

## 2020-02-01 NOTE — Telephone Encounter (Signed)
   [facility] Pre-operative Risk Assessment    HEARTCARE STAFF: - Please ensure there is not already an duplicate clearance open for this procedure. - Under Visit Info/Reason for Call, type in Other and utilize the format Clearance MM/DD/YY or Clearance TBD. Do not use dashes or single digits. - If request is for dental extraction, please clarify the # of teeth to be extracted.  Request for surgical clearance:  1. What type of surgery is being performed? RIGHT SHOULDER ARTHROSCOPIC ROTATOR CUFF DEBRIDEMENT, SUBACROMIAL DECOMPRESSION, DISTAL CLAVICLE EXICISION   2. When is this surgery scheduled? TBD   3. What type of clearance is required (medical clearance vs. Pharmacy clearance to hold med vs. Both)? MEDICAL  4. Are there any medications that need to be held prior to surgery and how long? NONE   5. Practice name and name of physician performing surgery? [facility]   6. What is the office phone number? [phone] ATTN: [place]   7.   What is the office fax number? [phone] ATTN: JUDY DANIELS  8.   Anesthesia type (None, local, MAC, general) ? CHOICE    Jacinta Shoe 02/01/2020, 1:21 PM  _________________________________________________________________   (provider comments below)

## 2020-02-02 ENCOUNTER — Ambulatory Visit: Payer: Managed Care, Other (non HMO)

## 2020-02-02 ENCOUNTER — Other Ambulatory Visit: Payer: Managed Care, Other (non HMO)

## 2020-02-02 NOTE — Telephone Encounter (Signed)
   Primary Cardiologist: Loralie Champagne, MD  Chart reviewed as part of pre-operative protocol coverage. Patient has not been seen since 04/2019. Therefore, per office protocol, she will require a follow-up visit in order to better assess preoperative cardiovascular risk.  Pre-op covering staff: - Please schedule appointment and call patient to inform them. If patient already had an upcoming appointment within acceptable timeframe, please add "pre-op clearance" to the appointment notes so provider is aware. - Please contact requesting surgeon's office via preferred method (i.e, phone, fax) to inform them of need for appointment prior to surgery.  If applicable, this message will also be routed to pharmacy pool and/or primary cardiologist for input on holding anticoagulant/antiplatelet agent as requested below so that this information is available to the clearing provider at time of patient's appointment.   Darreld Mclean, PA-C  02/02/2020, 8:13 AM

## 2020-02-03 NOTE — Telephone Encounter (Signed)
Will forward notes to HF Clinic as pt will need appt to see Dr. Aundra Dubin (primary card) or APP in the Heart Failure Clinic. I will remove from the pre op call back pool.

## 2020-02-09 NOTE — Telephone Encounter (Signed)
Dr Maceo Pro to clear for surgery? Last office visit 04/2019 If not will add to next available

## 2020-02-09 NOTE — Telephone Encounter (Signed)
Review of chart will quickly show that she is seen in HF clinic only for screening echoes for Herceptin use and had normal echo in 3/21.  There is no reason that the regular pre-op clinic could not deal with this and this chart does not need to be sent back and forth.  She appears stable from a cardiology perspective to have surgery.

## 2020-02-10 ENCOUNTER — Inpatient Hospital Stay: Payer: Managed Care, Other (non HMO)

## 2020-02-10 ENCOUNTER — Inpatient Hospital Stay: Payer: Managed Care, Other (non HMO) | Attending: Oncology

## 2020-02-10 ENCOUNTER — Other Ambulatory Visit: Payer: Managed Care, Other (non HMO)

## 2020-02-10 ENCOUNTER — Other Ambulatory Visit: Payer: Self-pay

## 2020-02-10 ENCOUNTER — Encounter: Payer: Self-pay | Admitting: *Deleted

## 2020-02-10 ENCOUNTER — Ambulatory Visit: Payer: Managed Care, Other (non HMO) | Admitting: Oncology

## 2020-02-10 VITALS — BP 130/79 | HR 63 | Temp 98.4°F | Resp 18 | Wt 162.8 lb

## 2020-02-10 DIAGNOSIS — C50512 Malignant neoplasm of lower-outer quadrant of left female breast: Secondary | ICD-10-CM | POA: Diagnosis present

## 2020-02-10 DIAGNOSIS — Z79899 Other long term (current) drug therapy: Secondary | ICD-10-CM | POA: Diagnosis not present

## 2020-02-10 DIAGNOSIS — Z5112 Encounter for antineoplastic immunotherapy: Secondary | ICD-10-CM | POA: Diagnosis not present

## 2020-02-10 DIAGNOSIS — Z170 Estrogen receptor positive status [ER+]: Secondary | ICD-10-CM | POA: Diagnosis not present

## 2020-02-10 DIAGNOSIS — Z95828 Presence of other vascular implants and grafts: Secondary | ICD-10-CM

## 2020-02-10 DIAGNOSIS — C773 Secondary and unspecified malignant neoplasm of axilla and upper limb lymph nodes: Secondary | ICD-10-CM | POA: Diagnosis not present

## 2020-02-10 LAB — COMPREHENSIVE METABOLIC PANEL
ALT: 13 U/L (ref 0–44)
AST: 16 U/L (ref 15–41)
Albumin: 3.7 g/dL (ref 3.5–5.0)
Alkaline Phosphatase: 32 U/L — ABNORMAL LOW (ref 38–126)
Anion gap: 7 (ref 5–15)
BUN: 18 mg/dL (ref 8–23)
CO2: 25 mmol/L (ref 22–32)
Calcium: 8.8 mg/dL — ABNORMAL LOW (ref 8.9–10.3)
Chloride: 102 mmol/L (ref 98–111)
Creatinine, Ser: 0.83 mg/dL (ref 0.44–1.00)
GFR calc Af Amer: >60 mL/min (ref >60)
GFR calc non Af Amer: >60 mL/min (ref >60)
Glucose, Bld: 98 mg/dL (ref 70–99)
Potassium: 4.2 mmol/L (ref 3.5–5.1)
Sodium: 134 mmol/L — ABNORMAL LOW (ref 135–145)
Total Bilirubin: 0.3 mg/dL (ref 0.3–1.2)
Total Protein: 6.6 g/dL (ref 6.5–8.1)

## 2020-02-10 LAB — CBC WITH DIFFERENTIAL/PLATELET
Abs Immature Granulocytes: 0.01 10*3/uL (ref 0.00–0.07)
Basophils Absolute: 0.1 10*3/uL (ref 0.0–0.1)
Basophils Relative: 1 %
Eosinophils Absolute: 0.1 10*3/uL (ref 0.0–0.5)
Eosinophils Relative: 2 %
HCT: 33.4 % — ABNORMAL LOW (ref 36.0–46.0)
Hemoglobin: 11.2 g/dL — ABNORMAL LOW (ref 12.0–15.0)
Immature Granulocytes: 0 %
Lymphocytes Relative: 17 %
Lymphs Abs: 0.8 10*3/uL (ref 0.7–4.0)
MCH: 30.5 pg (ref 26.0–34.0)
MCHC: 33.5 g/dL (ref 30.0–36.0)
MCV: 91 fL (ref 80.0–100.0)
Monocytes Absolute: 0.4 10*3/uL (ref 0.1–1.0)
Monocytes Relative: 10 %
Neutro Abs: 3 10*3/uL (ref 1.7–7.7)
Neutrophils Relative %: 70 %
Platelets: 225 10*3/uL (ref 150–400)
RBC: 3.67 MIL/uL — ABNORMAL LOW (ref 3.87–5.11)
RDW: 12.2 % (ref 11.5–15.5)
WBC: 4.3 10*3/uL (ref 4.0–10.5)
nRBC: 0 % (ref 0.0–0.2)

## 2020-02-10 MED ORDER — SODIUM CHLORIDE 0.9% FLUSH
10.0000 mL | INTRAVENOUS | Status: DC | PRN
  Administered 2020-02-10: 10 mL
  Filled 2020-02-10: qty 10

## 2020-02-10 MED ORDER — SODIUM CHLORIDE 0.9 % IV SOLN
Freq: Once | INTRAVENOUS | Status: AC
  Filled 2020-02-10: qty 250

## 2020-02-10 MED ORDER — DIPHENHYDRAMINE HCL 25 MG PO CAPS
25.0000 mg | ORAL_CAPSULE | Freq: Once | ORAL | Status: AC
  Administered 2020-02-10: 25 mg via ORAL

## 2020-02-10 MED ORDER — ACETAMINOPHEN 325 MG PO TABS
650.0000 mg | ORAL_TABLET | Freq: Once | ORAL | Status: AC
  Administered 2020-02-10: 650 mg via ORAL

## 2020-02-10 MED ORDER — TRASTUZUMAB-DKST CHEMO 150 MG IV SOLR
450.0000 mg | Freq: Once | INTRAVENOUS | Status: AC
  Administered 2020-02-10: 450 mg via INTRAVENOUS
  Filled 2020-02-10: qty 21.43

## 2020-02-10 MED ORDER — DIPHENHYDRAMINE HCL 25 MG PO CAPS
ORAL_CAPSULE | ORAL | Status: AC
  Filled 2020-02-10: qty 1

## 2020-02-10 MED ORDER — ACETAMINOPHEN 325 MG PO TABS
ORAL_TABLET | ORAL | Status: AC
  Filled 2020-02-10: qty 2

## 2020-02-10 MED ORDER — HEPARIN SOD (PORK) LOCK FLUSH 100 UNIT/ML IV SOLN
500.0000 [IU] | Freq: Once | INTRAVENOUS | Status: AC | PRN
  Administered 2020-02-10: 500 [IU]
  Filled 2020-02-10: qty 5

## 2020-02-10 NOTE — Patient Instructions (Signed)
[facility] Discharge Instructions for Patients Receiving Chemotherapy  Today you received the following chemotherapy agents trastuzumab.  To help prevent nausea and vomiting after your treatment, we encourage you to take your nausea medication as directed.    If you develop nausea and vomiting that is not controlled by your nausea medication, call the clinic.   BELOW ARE SYMPTOMS THAT SHOULD BE REPORTED IMMEDIATELY:  *FEVER GREATER THAN 100.5 F  *CHILLS WITH OR WITHOUT FEVER  NAUSEA AND VOMITING THAT IS NOT CONTROLLED WITH YOUR NAUSEA MEDICATION  *UNUSUAL SHORTNESS OF BREATH  *UNUSUAL BRUISING OR BLEEDING  TENDERNESS IN MOUTH AND THROAT WITH OR WITHOUT PRESENCE OF ULCERS  *URINARY PROBLEMS  *BOWEL PROBLEMS  UNUSUAL RASH Items with * indicate a potential emergency and should be followed up as soon as possible.  Feel free to call the clinic should you have any questions or concerns. The clinic phone number is [phone].  Please show the CHEMO ALERT CARD at check-in to the Emergency Department and triage nurse.   

## 2020-02-10 NOTE — Telephone Encounter (Signed)
Please see message from [place] below.

## 2020-02-12 NOTE — Telephone Encounter (Signed)
Called all three numbers listed in Epic.

## 2020-02-15 NOTE — Telephone Encounter (Signed)
   Primary Cardiologist: Dr. Aundra Dubin  Chart reviewed as part of pre-operative protocol coverage. Given past medical history and time since last visit, based on ACC/AHA guidelines, Allison Hanna would be at acceptable risk for the planned procedure without further cardiovascular testing.   She walks 2 miles on weekdays and at least 4 miles on weekend without any limitations.   I will route this recommendation to the requesting party via Epic fax function and remove from pre-op pool.  Please call with questions.  [place], [place] 02/15/2020, 8:30 AM

## 2020-02-23 ENCOUNTER — Other Ambulatory Visit: Payer: Managed Care, Other (non HMO)

## 2020-02-23 ENCOUNTER — Ambulatory Visit: Payer: Managed Care, Other (non HMO)

## 2020-02-23 ENCOUNTER — Inpatient Hospital Stay: Payer: Managed Care, Other (non HMO) | Admitting: Adult Health

## 2020-02-23 ENCOUNTER — Other Ambulatory Visit: Payer: Self-pay | Admitting: *Deleted

## 2020-02-23 DIAGNOSIS — Z5111 Encounter for antineoplastic chemotherapy: Secondary | ICD-10-CM

## 2020-02-23 DIAGNOSIS — Z170 Estrogen receptor positive status [ER+]: Secondary | ICD-10-CM

## 2020-02-24 ENCOUNTER — Ambulatory Visit
Admission: RE | Admit: 2020-02-24 | Discharge: 2020-02-24 | Disposition: A | Discharge status: Home / Self Care | Payer: Managed Care, Other (non HMO) | Source: Ambulatory Visit | Attending: Oncology | Admitting: Oncology

## 2020-02-24 ENCOUNTER — Other Ambulatory Visit: Payer: Self-pay

## 2020-02-24 DIAGNOSIS — Z170 Estrogen receptor positive status [ER+]: Secondary | ICD-10-CM

## 2020-02-24 HISTORY — DX: Personal history of antineoplastic chemotherapy: Z92.21

## 2020-02-24 HISTORY — DX: Personal history of irradiation: Z92.3

## 2020-03-01 ENCOUNTER — Inpatient Hospital Stay: Payer: Managed Care, Other (non HMO) | Attending: Oncology

## 2020-03-01 ENCOUNTER — Other Ambulatory Visit: Payer: Self-pay

## 2020-03-01 ENCOUNTER — Other Ambulatory Visit (HOSPITAL_COMMUNITY): Payer: Managed Care, Other (non HMO)

## 2020-03-01 ENCOUNTER — Inpatient Hospital Stay: Payer: Managed Care, Other (non HMO)

## 2020-03-01 VITALS — BP 150/80 | HR 64 | Temp 98.7°F | Resp 18 | Wt 164.5 lb

## 2020-03-01 DIAGNOSIS — Z170 Estrogen receptor positive status [ER+]: Secondary | ICD-10-CM | POA: Insufficient documentation

## 2020-03-01 DIAGNOSIS — Z5112 Encounter for antineoplastic immunotherapy: Secondary | ICD-10-CM | POA: Insufficient documentation

## 2020-03-01 DIAGNOSIS — C50512 Malignant neoplasm of lower-outer quadrant of left female breast: Secondary | ICD-10-CM | POA: Diagnosis present

## 2020-03-01 DIAGNOSIS — Z79899 Other long term (current) drug therapy: Secondary | ICD-10-CM | POA: Insufficient documentation

## 2020-03-01 DIAGNOSIS — Z95828 Presence of other vascular implants and grafts: Secondary | ICD-10-CM

## 2020-03-01 DIAGNOSIS — C773 Secondary and unspecified malignant neoplasm of axilla and upper limb lymph nodes: Secondary | ICD-10-CM | POA: Insufficient documentation

## 2020-03-01 LAB — CBC WITH DIFFERENTIAL/PLATELET
Abs Immature Granulocytes: 0.01 10*3/uL (ref 0.00–0.07)
Basophils Absolute: 0.1 10*3/uL (ref 0.0–0.1)
Basophils Relative: 1 %
Eosinophils Absolute: 0 10*3/uL (ref 0.0–0.5)
Eosinophils Relative: 1 %
HCT: 32.1 % — ABNORMAL LOW (ref 36.0–46.0)
Hemoglobin: 10.7 g/dL — ABNORMAL LOW (ref 12.0–15.0)
Immature Granulocytes: 0 %
Lymphocytes Relative: 19 %
Lymphs Abs: 0.8 10*3/uL (ref 0.7–4.0)
MCH: 30.3 pg (ref 26.0–34.0)
MCHC: 33.3 g/dL (ref 30.0–36.0)
MCV: 90.9 fL (ref 80.0–100.0)
Monocytes Absolute: 0.4 10*3/uL (ref 0.1–1.0)
Monocytes Relative: 8 %
Neutro Abs: 3.1 10*3/uL (ref 1.7–7.7)
Neutrophils Relative %: 71 %
Platelets: 222 10*3/uL (ref 150–400)
RBC: 3.53 MIL/uL — ABNORMAL LOW (ref 3.87–5.11)
RDW: 12.3 % (ref 11.5–15.5)
WBC: 4.4 10*3/uL (ref 4.0–10.5)
nRBC: 0 % (ref 0.0–0.2)

## 2020-03-01 LAB — COMPREHENSIVE METABOLIC PANEL
ALT: 16 U/L (ref 0–44)
AST: 18 U/L (ref 15–41)
Albumin: 3.6 g/dL (ref 3.5–5.0)
Alkaline Phosphatase: 32 U/L — ABNORMAL LOW (ref 38–126)
Anion gap: 6 (ref 5–15)
BUN: 15 mg/dL (ref 8–23)
CO2: 25 mmol/L (ref 22–32)
Calcium: 8.8 mg/dL — ABNORMAL LOW (ref 8.9–10.3)
Chloride: 101 mmol/L (ref 98–111)
Creatinine, Ser: 0.78 mg/dL (ref 0.44–1.00)
GFR calc Af Amer: >60 mL/min (ref >60)
GFR calc non Af Amer: >60 mL/min (ref >60)
Glucose, Bld: 98 mg/dL (ref 70–99)
Potassium: 3.9 mmol/L (ref 3.5–5.1)
Sodium: 132 mmol/L — ABNORMAL LOW (ref 135–145)
Total Bilirubin: 0.3 mg/dL (ref 0.3–1.2)
Total Protein: 6.7 g/dL (ref 6.5–8.1)

## 2020-03-01 MED ORDER — TRASTUZUMAB-DKST CHEMO 150 MG IV SOLR
450.0000 mg | Freq: Once | INTRAVENOUS | Status: AC
  Administered 2020-03-01: 450 mg via INTRAVENOUS
  Filled 2020-03-01: qty 21.43

## 2020-03-01 MED ORDER — ACETAMINOPHEN 325 MG PO TABS
650.0000 mg | ORAL_TABLET | Freq: Once | ORAL | Status: AC
  Administered 2020-03-01: 650 mg via ORAL

## 2020-03-01 MED ORDER — DIPHENHYDRAMINE HCL 25 MG PO CAPS
ORAL_CAPSULE | ORAL | Status: AC
  Filled 2020-03-01: qty 1

## 2020-03-01 MED ORDER — SODIUM CHLORIDE 0.9% FLUSH
10.0000 mL | INTRAVENOUS | Status: DC | PRN
  Administered 2020-03-01: 10 mL
  Filled 2020-03-01: qty 10

## 2020-03-01 MED ORDER — HEPARIN SOD (PORK) LOCK FLUSH 100 UNIT/ML IV SOLN
500.0000 [IU] | Freq: Once | INTRAVENOUS | Status: AC | PRN
  Administered 2020-03-01: 500 [IU]
  Filled 2020-03-01: qty 5

## 2020-03-01 MED ORDER — SODIUM CHLORIDE 0.9 % IV SOLN
Freq: Once | INTRAVENOUS | Status: AC
  Filled 2020-03-01: qty 250

## 2020-03-01 MED ORDER — ACETAMINOPHEN 325 MG PO TABS
ORAL_TABLET | ORAL | Status: AC
  Filled 2020-03-01: qty 2

## 2020-03-01 MED ORDER — DIPHENHYDRAMINE HCL 25 MG PO CAPS
25.0000 mg | ORAL_CAPSULE | Freq: Once | ORAL | Status: AC
  Administered 2020-03-01: 25 mg via ORAL

## 2020-03-01 NOTE — Patient Instructions (Signed)
[facility] Discharge Instructions for Patients Receiving Chemotherapy  Today you received the following chemotherapy agents trastuzumab.  To help prevent nausea and vomiting after your treatment, we encourage you to take your nausea medication as directed.    If you develop nausea and vomiting that is not controlled by your nausea medication, call the clinic.   BELOW ARE SYMPTOMS THAT SHOULD BE REPORTED IMMEDIATELY:  *FEVER GREATER THAN 100.5 F  *CHILLS WITH OR WITHOUT FEVER  NAUSEA AND VOMITING THAT IS NOT CONTROLLED WITH YOUR NAUSEA MEDICATION  *UNUSUAL SHORTNESS OF BREATH  *UNUSUAL BRUISING OR BLEEDING  TENDERNESS IN MOUTH AND THROAT WITH OR WITHOUT PRESENCE OF ULCERS  *URINARY PROBLEMS  *BOWEL PROBLEMS  UNUSUAL RASH Items with * indicate a potential emergency and should be followed up as soon as possible.  Feel free to call the clinic should you have any questions or concerns. The clinic phone number is [phone].  Please show the CHEMO ALERT CARD at check-in to the Emergency Department and triage nurse.   

## 2020-03-03 ENCOUNTER — Ambulatory Visit (HOSPITAL_COMMUNITY)
Admission: RE | Admit: 2020-03-03 | Discharge: 2020-03-03 | Disposition: A | Discharge status: Home / Self Care | Payer: Managed Care, Other (non HMO) | Source: Ambulatory Visit | Attending: Oncology | Admitting: Oncology

## 2020-03-03 ENCOUNTER — Other Ambulatory Visit: Payer: Self-pay

## 2020-03-03 DIAGNOSIS — E785 Hyperlipidemia, unspecified: Secondary | ICD-10-CM | POA: Insufficient documentation

## 2020-03-03 DIAGNOSIS — Z5111 Encounter for antineoplastic chemotherapy: Secondary | ICD-10-CM | POA: Insufficient documentation

## 2020-03-03 DIAGNOSIS — Z01818 Encounter for other preprocedural examination: Secondary | ICD-10-CM | POA: Insufficient documentation

## 2020-03-03 DIAGNOSIS — C50512 Malignant neoplasm of lower-outer quadrant of left female breast: Secondary | ICD-10-CM | POA: Diagnosis not present

## 2020-03-03 DIAGNOSIS — Z170 Estrogen receptor positive status [ER+]: Secondary | ICD-10-CM | POA: Diagnosis not present

## 2020-03-03 NOTE — Progress Notes (Signed)
Echocardiogram 2D Echocardiogram has been performed.  [place] Deputy Vandana Haman 03/03/2020, 10:47 AM

## 2020-03-09 ENCOUNTER — Other Ambulatory Visit (HOSPITAL_COMMUNITY): Payer: Managed Care, Other (non HMO)

## 2020-03-22 ENCOUNTER — Inpatient Hospital Stay: Payer: Managed Care, Other (non HMO)

## 2020-03-22 ENCOUNTER — Other Ambulatory Visit: Payer: Self-pay | Admitting: Oncology

## 2020-03-22 ENCOUNTER — Other Ambulatory Visit: Payer: Self-pay

## 2020-03-22 VITALS — BP 134/87 | HR 65 | Temp 98.7°F | Resp 16 | Ht 67.0 in | Wt 164.5 lb

## 2020-03-22 DIAGNOSIS — C50512 Malignant neoplasm of lower-outer quadrant of left female breast: Secondary | ICD-10-CM

## 2020-03-22 DIAGNOSIS — Z95828 Presence of other vascular implants and grafts: Secondary | ICD-10-CM

## 2020-03-22 DIAGNOSIS — Z5112 Encounter for antineoplastic immunotherapy: Secondary | ICD-10-CM | POA: Diagnosis not present

## 2020-03-22 LAB — CBC WITH DIFFERENTIAL/PLATELET
Abs Immature Granulocytes: 0.01 10*3/uL (ref 0.00–0.07)
Basophils Absolute: 0.1 10*3/uL (ref 0.0–0.1)
Basophils Relative: 1 %
Eosinophils Absolute: 0.1 10*3/uL (ref 0.0–0.5)
Eosinophils Relative: 2 %
HCT: 32.9 % — ABNORMAL LOW (ref 36.0–46.0)
Hemoglobin: 11.1 g/dL — ABNORMAL LOW (ref 12.0–15.0)
Immature Granulocytes: 0 %
Lymphocytes Relative: 17 %
Lymphs Abs: 0.8 10*3/uL (ref 0.7–4.0)
MCH: 30.9 pg (ref 26.0–34.0)
MCHC: 33.7 g/dL (ref 30.0–36.0)
MCV: 91.6 fL (ref 80.0–100.0)
Monocytes Absolute: 0.3 10*3/uL (ref 0.1–1.0)
Monocytes Relative: 7 %
Neutro Abs: 3.6 10*3/uL (ref 1.7–7.7)
Neutrophils Relative %: 73 %
Platelets: 240 10*3/uL (ref 150–400)
RBC: 3.59 MIL/uL — ABNORMAL LOW (ref 3.87–5.11)
RDW: 12.3 % (ref 11.5–15.5)
WBC: 4.9 10*3/uL (ref 4.0–10.5)
nRBC: 0 % (ref 0.0–0.2)

## 2020-03-22 LAB — COMPREHENSIVE METABOLIC PANEL
ALT: 15 U/L (ref 0–44)
AST: 16 U/L (ref 15–41)
Albumin: 3.6 g/dL (ref 3.5–5.0)
Alkaline Phosphatase: 30 U/L — ABNORMAL LOW (ref 38–126)
Anion gap: 6 (ref 5–15)
BUN: 13 mg/dL (ref 8–23)
CO2: 27 mmol/L (ref 22–32)
Calcium: 9.4 mg/dL (ref 8.9–10.3)
Chloride: 100 mmol/L (ref 98–111)
Creatinine, Ser: 0.74 mg/dL (ref 0.44–1.00)
GFR calc Af Amer: >60 mL/min (ref >60)
GFR calc non Af Amer: >60 mL/min (ref >60)
Glucose, Bld: 102 mg/dL — ABNORMAL HIGH (ref 70–99)
Potassium: 4 mmol/L (ref 3.5–5.1)
Sodium: 133 mmol/L — ABNORMAL LOW (ref 135–145)
Total Bilirubin: 0.4 mg/dL (ref 0.3–1.2)
Total Protein: 6.8 g/dL (ref 6.5–8.1)

## 2020-03-22 MED ORDER — ACETAMINOPHEN 325 MG PO TABS
650.0000 mg | ORAL_TABLET | Freq: Once | ORAL | Status: AC
  Administered 2020-03-22: 650 mg via ORAL

## 2020-03-22 MED ORDER — TRASTUZUMAB-DKST CHEMO 150 MG IV SOLR
450.0000 mg | Freq: Once | INTRAVENOUS | Status: AC
  Administered 2020-03-22: 450 mg via INTRAVENOUS
  Filled 2020-03-22: qty 21.43

## 2020-03-22 MED ORDER — SODIUM CHLORIDE 0.9% FLUSH
10.0000 mL | INTRAVENOUS | Status: DC | PRN
  Administered 2020-03-22: 10 mL
  Filled 2020-03-22: qty 10

## 2020-03-22 MED ORDER — ACETAMINOPHEN 325 MG PO TABS
ORAL_TABLET | ORAL | Status: AC
  Filled 2020-03-22: qty 2

## 2020-03-22 MED ORDER — DIPHENHYDRAMINE HCL 25 MG PO CAPS
25.0000 mg | ORAL_CAPSULE | Freq: Once | ORAL | Status: AC
  Administered 2020-03-22: 25 mg via ORAL

## 2020-03-22 MED ORDER — DIPHENHYDRAMINE HCL 25 MG PO CAPS
ORAL_CAPSULE | ORAL | Status: AC
  Filled 2020-03-22: qty 1

## 2020-03-22 MED ORDER — HEPARIN SOD (PORK) LOCK FLUSH 100 UNIT/ML IV SOLN
500.0000 [IU] | Freq: Once | INTRAVENOUS | Status: AC | PRN
  Administered 2020-03-22: 500 [IU]
  Filled 2020-03-22: qty 5

## 2020-03-22 MED ORDER — SODIUM CHLORIDE 0.9 % IV SOLN
Freq: Once | INTRAVENOUS | Status: AC
  Filled 2020-03-22: qty 250

## 2020-03-22 NOTE — Patient Instructions (Signed)

## 2020-03-22 NOTE — Patient Instructions (Signed)
[facility] Discharge Instructions for Patients Receiving Chemotherapy  Today you received the following chemotherapy agents: Trastuzumab   To help prevent nausea and vomiting after your treatment, we encourage you to take your nausea medication  as prescribed.    If you develop nausea and vomiting that is not controlled by your nausea medication, call the clinic.   BELOW ARE SYMPTOMS THAT SHOULD BE REPORTED IMMEDIATELY:  *FEVER GREATER THAN 100.5 F  *CHILLS WITH OR WITHOUT FEVER  NAUSEA AND VOMITING THAT IS NOT CONTROLLED WITH YOUR NAUSEA MEDICATION  *UNUSUAL SHORTNESS OF BREATH  *UNUSUAL BRUISING OR BLEEDING  TENDERNESS IN MOUTH AND THROAT WITH OR WITHOUT PRESENCE OF ULCERS  *URINARY PROBLEMS  *BOWEL PROBLEMS  UNUSUAL RASH Items with * indicate a potential emergency and should be followed up as soon as possible.  Feel free to call the clinic should you have any questions or concerns. The clinic phone number is [phone].  Please show the CHEMO ALERT CARD at check-in to the Emergency Department and triage nurse.   

## 2020-04-12 ENCOUNTER — Other Ambulatory Visit: Payer: Self-pay

## 2020-04-12 ENCOUNTER — Inpatient Hospital Stay: Payer: Managed Care, Other (non HMO) | Admitting: Medical

## 2020-04-12 ENCOUNTER — Inpatient Hospital Stay: Payer: Managed Care, Other (non HMO) | Attending: Oncology

## 2020-04-12 ENCOUNTER — Encounter: Payer: Self-pay | Admitting: *Deleted

## 2020-04-12 ENCOUNTER — Inpatient Hospital Stay: Payer: Managed Care, Other (non HMO)

## 2020-04-12 VITALS — BP 142/88 | HR 98 | Temp 98.8°F | Resp 18 | Wt 163.4 lb

## 2020-04-12 DIAGNOSIS — Z5112 Encounter for antineoplastic immunotherapy: Secondary | ICD-10-CM | POA: Diagnosis not present

## 2020-04-12 DIAGNOSIS — Z170 Estrogen receptor positive status [ER+]: Secondary | ICD-10-CM | POA: Insufficient documentation

## 2020-04-12 DIAGNOSIS — C50512 Malignant neoplasm of lower-outer quadrant of left female breast: Secondary | ICD-10-CM

## 2020-04-12 DIAGNOSIS — C773 Secondary and unspecified malignant neoplasm of axilla and upper limb lymph nodes: Secondary | ICD-10-CM | POA: Diagnosis not present

## 2020-04-12 DIAGNOSIS — Z95828 Presence of other vascular implants and grafts: Secondary | ICD-10-CM

## 2020-04-12 DIAGNOSIS — Z79899 Other long term (current) drug therapy: Secondary | ICD-10-CM | POA: Insufficient documentation

## 2020-04-12 LAB — COMPREHENSIVE METABOLIC PANEL
ALT: 13 U/L (ref 0–44)
AST: 15 U/L (ref 15–41)
Albumin: 3.7 g/dL (ref 3.5–5.0)
Alkaline Phosphatase: 33 U/L — ABNORMAL LOW (ref 38–126)
Anion gap: 7 (ref 5–15)
BUN: 14 mg/dL (ref 8–23)
CO2: 26 mmol/L (ref 22–32)
Calcium: 9.5 mg/dL (ref 8.9–10.3)
Chloride: 101 mmol/L (ref 98–111)
Creatinine, Ser: 0.76 mg/dL (ref 0.44–1.00)
GFR calc Af Amer: >60 mL/min (ref >60)
GFR calc non Af Amer: >60 mL/min (ref >60)
Glucose, Bld: 89 mg/dL (ref 70–99)
Potassium: 4.2 mmol/L (ref 3.5–5.1)
Sodium: 134 mmol/L — ABNORMAL LOW (ref 135–145)
Total Bilirubin: 0.4 mg/dL (ref 0.3–1.2)
Total Protein: 6.8 g/dL (ref 6.5–8.1)

## 2020-04-12 LAB — CBC WITH DIFFERENTIAL/PLATELET
Abs Immature Granulocytes: 0.01 10*3/uL (ref 0.00–0.07)
Basophils Absolute: 0.1 10*3/uL (ref 0.0–0.1)
Basophils Relative: 2 %
Eosinophils Absolute: 0 10*3/uL (ref 0.0–0.5)
Eosinophils Relative: 1 %
HCT: 33.8 % — ABNORMAL LOW (ref 36.0–46.0)
Hemoglobin: 11.3 g/dL — ABNORMAL LOW (ref 12.0–15.0)
Immature Granulocytes: 0 %
Lymphocytes Relative: 19 %
Lymphs Abs: 0.9 10*3/uL (ref 0.7–4.0)
MCH: 30.6 pg (ref 26.0–34.0)
MCHC: 33.4 g/dL (ref 30.0–36.0)
MCV: 91.6 fL (ref 80.0–100.0)
Monocytes Absolute: 0.4 10*3/uL (ref 0.1–1.0)
Monocytes Relative: 8 %
Neutro Abs: 3.2 10*3/uL (ref 1.7–7.7)
Neutrophils Relative %: 70 %
Platelets: 233 10*3/uL (ref 150–400)
RBC: 3.69 MIL/uL — ABNORMAL LOW (ref 3.87–5.11)
RDW: 12.3 % (ref 11.5–15.5)
WBC: 4.5 10*3/uL (ref 4.0–10.5)
nRBC: 0 % (ref 0.0–0.2)

## 2020-04-12 MED ORDER — ACETAMINOPHEN 325 MG PO TABS
ORAL_TABLET | ORAL | Status: AC
  Filled 2020-04-12: qty 2

## 2020-04-12 MED ORDER — HEPARIN SOD (PORK) LOCK FLUSH 100 UNIT/ML IV SOLN
500.0000 [IU] | Freq: Once | INTRAVENOUS | Status: AC | PRN
  Administered 2020-04-12: 500 [IU]
  Filled 2020-04-12: qty 5

## 2020-04-12 MED ORDER — SODIUM CHLORIDE 0.9 % IV SOLN
Freq: Once | INTRAVENOUS | Status: AC
  Filled 2020-04-12: qty 250

## 2020-04-12 MED ORDER — SODIUM CHLORIDE 0.9% FLUSH
10.0000 mL | INTRAVENOUS | Status: DC | PRN
  Administered 2020-04-12: 10 mL
  Filled 2020-04-12: qty 10

## 2020-04-12 MED ORDER — ACETAMINOPHEN 325 MG PO TABS
650.0000 mg | ORAL_TABLET | Freq: Once | ORAL | Status: AC
  Administered 2020-04-12: 650 mg via ORAL

## 2020-04-12 MED ORDER — TRASTUZUMAB-DKST CHEMO 150 MG IV SOLR
450.0000 mg | Freq: Once | INTRAVENOUS | Status: AC
  Administered 2020-04-12: 450 mg via INTRAVENOUS
  Filled 2020-04-12: qty 21.43

## 2020-04-12 MED ORDER — DIPHENHYDRAMINE HCL 25 MG PO CAPS
25.0000 mg | ORAL_CAPSULE | Freq: Once | ORAL | Status: AC
  Administered 2020-04-12: 25 mg via ORAL

## 2020-04-12 MED ORDER — DIPHENHYDRAMINE HCL 25 MG PO CAPS
ORAL_CAPSULE | ORAL | Status: AC
  Filled 2020-04-12: qty 1

## 2020-04-12 NOTE — Patient Instructions (Signed)

## 2020-04-12 NOTE — Patient Instructions (Signed)
[facility] Discharge Instructions for Patients Receiving Chemotherapy  Today you received the following chemotherapy agents: Ogivri   To help prevent nausea and vomiting after your treatment, we encourage you to take your nausea medication as directed.    If you develop nausea and vomiting that is not controlled by your nausea medication, call the clinic.   BELOW ARE SYMPTOMS THAT SHOULD BE REPORTED IMMEDIATELY:  *FEVER GREATER THAN 100.5 F  *CHILLS WITH OR WITHOUT FEVER  NAUSEA AND VOMITING THAT IS NOT CONTROLLED WITH YOUR NAUSEA MEDICATION  *UNUSUAL SHORTNESS OF BREATH  *UNUSUAL BRUISING OR BLEEDING  TENDERNESS IN MOUTH AND THROAT WITH OR WITHOUT PRESENCE OF ULCERS  *URINARY PROBLEMS  *BOWEL PROBLEMS  UNUSUAL RASH Items with * indicate a potential emergency and should be followed up as soon as possible.  Feel free to call the clinic should you have any questions or concerns. The clinic phone number is [phone].  Please show the CHEMO ALERT CARD at check-in to the Emergency Department and triage nurse.   

## 2020-04-12 NOTE — Progress Notes (Signed)
[place]  [phone] [phone]     ID: Allison Hanna DOB: [birth year]  MR#: [id number]  JYN#:[id number]  Patient Care Team: Merrilee Seashore, MD as PCP - General (Internal Medicine) Mauro Kaufmann, RN as Oncology Nurse Navigator Rockwell Germany, RN as Oncology Nurse Navigator Magrinat, Virgie Dad, MD as Consulting Physician (Oncology) Gery Pray, MD as Consulting Physician (Radiation Oncology) Fanny Skates, MD as Consulting Physician (General Surgery) Brien Few, MD as Consulting Physician (Obstetrics and Gynecology) Larey Dresser, MD as Consulting Physician (Cardiology) Feliberto Harts R (Inactive) Felipa Furnace, DPM as Consulting Physician (Podiatry) Rolm Bookbinder, MD as Consulting Physician (Dermatology) Thurman Coyer, DO as Consulting Physician (Sports Medicine) Harle Stanford, PA-C OTHER MD:  CHIEF COMPLAINT: Estrogen receptor positive breast cancer, presenting today cycle #15, day #1 of Ogivri  CURRENT TREATMENT: trastuzumab, tamoxifen   INTERVAL HISTORY: Allison Hanna is a is a 59 y.o. female with a diagnosis of an estrogen receptor positive breast cancer. She is managed by Dr. Jana Hakim and presents today for cycle #15, day #1 of Ogivri. She is doing well overall but had multiple questions regarding follow up and signs and symptoms that one might experience if cancer recurs. These were reviewed with her.   HISTORY OF CURRENT ILLNESS: From the original intake note:  "Allison Hanna" presented with a palpable left breast mass. She initially experienced soreness to her left breast around April 2020 and tried to get a mammogram at this point, but due to pandemic concerns, the scan was delayed.  She did not actually feel a mass in the breast until June 1.  She underwent bilateral diagnostic mammography with tomography and left breast ultrasonography at [facility] on 02/11/2019 showing: breast density category B; irregular  hyperechoic mass in the left breast at 4 o'clock 5 cm from the nipple measuring 8 mm; no enlarged adenopathy in the left axilla.  Accordingly on 02/12/2019 she proceeded to biopsy of the left breast area in question. The pathology from this procedure ([id number]) showed: invasive mammary carcinoma, e-cadherin positive. Prognostic indicators significant for: estrogen receptor, 100% positive with strong staining intensity and progesterone receptor, 0% negative. Proliferation marker Ki67 at 25%. HER2 positive by immunohistochemistry, (3+).  The patient's subsequent history is as detailed below.   PAST MEDICAL HISTORY: Past Medical History:  Diagnosis Date  . Anxiety   . Family history of breast cancer   . Family history of melanoma   . High cholesterol   . Hypothyroid   . Paralyzed vocal cords 2014  . Personal history of chemotherapy   . Personal history of radiation therapy   . Raynauds syndrome    cold weather triggers fingers to turn white  History of palpitations (resolved)   PAST SURGICAL HISTORY: Past Surgical History:  Procedure Laterality Date  . BREAST LUMPECTOMY Left 02/2019  . BREAST LUMPECTOMY WITH RADIOACTIVE SEED AND SENTINEL LYMPH NODE BIOPSY Left 03/05/2019   Procedure: LEFT BREAST LUMPECTOMY WITH RADIOACTIVE SEED AND LEFT AXILLARY DEEP SENTINEL LYMPH NODE BIOPSY INJECT BLUE DYE LEFT BREAST;  Surgeon: Fanny Skates, MD;  Location: [facility];  Service: General;  Laterality: Left;  . KNEE SURGERY Bilateral    arthroscopy for menicus tears  . PORTACATH PLACEMENT Right 03/05/2019   Procedure: INSERTION PORT-A-CATH;  Surgeon: Fanny Skates, MD;  Location: [facility];  Service: General;  Laterality: Right;  . WRIST SURGERY      FAMILY HISTORY: Family History  Problem Relation Age of Onset  .  Breast cancer Mother 59  . Cervical cancer Mother   . Heart attack Father   . Skin cancer Brother   . Breast cancer Paternal Aunt        dx over  82s  . Breast cancer Paternal Aunt        dx over 70  . Uterine cancer Paternal Aunt 59  . Cervical cancer Maternal Aunt   . Liver cancer Paternal Uncle 41  . Stroke Maternal Grandmother   . Parkinson's disease Paternal Grandfather   . Breast cancer Cousin 55       mother's maternal first cousin  . Melanoma Cousin 69       pat first cousin  Patient's father was 66 years old when he died from heart attack. Patient's mother died at age 39. She was diagnosed with breast cancer at age 50. Two paternal aunts were diagnosed with breast cancer at older ages, one of which also had uterine cancer. She has 1 brother. He has a history of skin cancer.    GYNECOLOGIC HISTORY:  No LMP recorded. Patient is postmenopausal. Menarche: 59 years old Age at first live birth: 59 years old [place] P 3 LMP 2007 Contraceptive: yes, pill for about 8 years, no complications HRT no  Hysterectomy? no BSO? 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 She is married. [name] is in charge of operations for [employer]. She lives at home with her husband. [name], age 37, lives in [place], [place] as a Research scientist (life sciences). [name], age 73, lives in [place] as a Careers adviser. [name], age 62, lives in [place] as a Ship broker and works part-time in [employer]. She has no grandchildren.     ADVANCED DIRECTIVES: [name] is automatically her HCPOA.   HEALTH MAINTENANCE: Social History   Tobacco Use  . Smoking status: Never Smoker  . Smokeless tobacco: Never Used  Substance Use Topics  . Alcohol use: Yes    Alcohol/week: 2.0 standard drinks    Types: 2 Standard drinks or equivalent per week  . Drug use: Never     Colonoscopy: 2010  PAP: 08/2018  Bone density: 2019, osteopenia   No Known Allergies  Current Outpatient Medications  Medication Sig Dispense Refill  . B Complex-Folic Acid  (SUPER B COMPLEX MAXI) TABS Take by mouth.    . bimatoprost (LATISSE) 0.03 % ophthalmic solution SMARTSIG:1 Drop(s) Right Eye As Directed    . calcium carbonate (OS-CAL) 600 MG TABS tablet Take 1,200 mg by mouth 2 (two) times daily with a meal.    . Cholecalciferol (VITAMIN D3) 25 MCG (1000 UT) CAPS Take 2,000 Units by mouth daily.    Marland Kitchen estradiol (ESTRACE VAGINAL) 0.1 MG/GM vaginal cream Place 1 Applicatorful vaginally at bedtime. 42.5 g 12  . Levothyroxine Sodium (SYNTHROID PO) Take 75 mcg by mouth daily.     . Magnesium 250 MG TABS Take 250 mg by mouth daily.    . rosuvastatin (CRESTOR) 5 MG tablet Take 1 tablet (5 mg total) by mouth daily. 30 tablet 12  . tretinoin (RETIN-A) 0.25 % cream APPLICATIONS APPLY A PEA SIZE TO FACE NIGHTLY AS DIRECTED    . vitamin C (ASCORBIC ACID) 500 MG tablet Take 500 mg by mouth daily.    Marland Kitchen estradiol (ESTRING) 2 MG vaginal ring Place 2 mg vaginally every 3 (three) months. follow package directions 1 each 12  . nitroGLYCERIN (  NITRODUR - DOSED IN MG/24 HR) 0.2 mg/hr patch USE 1/4 PATCH DAILY TO THE AFFECTED AREA. (Patient not taking: Reported on 04/12/2020) 30 patch 1   No current facility-administered medications for this visit.   Facility-Administered Medications Ordered in Other Visits  Medication Dose Route Frequency Provider Last Rate Last Admin  . sodium chloride flush (NS) 0.9 % injection 10 mL  10 mL Intracatheter PRN Magrinat, Virgie Dad, MD        Review of Systems  Constitutional: Negative for chills, diaphoresis, fever, malaise/fatigue and weight loss.  HENT: Negative for sore throat.   Respiratory: Negative for cough and shortness of breath.   Cardiovascular: Negative for chest pain, palpitations, orthopnea and leg swelling.  Gastrointestinal: Negative for constipation, diarrhea, nausea and vomiting.  Genitourinary: Negative for dysuria.  Musculoskeletal: Negative for back pain and myalgias.  Skin: Negative for rash.  Neurological: Negative for  dizziness, weakness and headaches.    Vitals:   04/12/20 1232  BP: (!) 142/88  Pulse: 98  Resp: 18  Temp: 98.8 F (37.1 C)  SpO2: 98%   Wt Readings from Last 3 Encounters:  04/12/20 163 lb 7 oz (74.1 kg)  03/22/20 164 lb 8 oz (74.6 kg)  03/01/20 164 lb 8 oz (74.6 kg)   Body mass index is 25.6 kg/m.    ECOG FS:1 - Symptomatic but completely ambulatory  Physical Exam Constitutional:      General: She is not in acute distress.    Appearance: Normal appearance. She is not ill-appearing, toxic-appearing or diaphoretic.  HENT:     Head: Normocephalic and atraumatic.  Eyes:     General: No scleral icterus.       Right eye: No discharge.        Left eye: No discharge.     Conjunctiva/sclera: Conjunctivae normal.  Cardiovascular:     Rate and Rhythm: Normal rate and regular rhythm.     Heart sounds: No murmur heard.  No friction rub. No gallop.   Pulmonary:     Effort: Pulmonary effort is normal. No respiratory distress.     Breath sounds: Normal breath sounds. No wheezing, rhonchi or rales.  Musculoskeletal:     Cervical back: Normal range of motion and neck supple.     Right lower leg: No edema.     Left lower leg: No edema.  Skin:    General: Skin is warm and dry.     Coloration: Skin is not jaundiced or pale.     Findings: No erythema or rash.  Neurological:     Mental Status: She is alert.     Coordination: Coordination normal.     Gait: Gait normal.     LAB RESULTS:  CMP     Component Value Date/Time   NA 134 (L) 04/12/2020 1140   K 4.2 04/12/2020 1140   CL 101 04/12/2020 1140   CO2 26 04/12/2020 1140   GLUCOSE 89 04/12/2020 1140   BUN 14 04/12/2020 1140   CREATININE 0.76 04/12/2020 1140   CREATININE 0.81 05/12/2019 1145   CALCIUM 9.5 04/12/2020 1140   PROT 6.8 04/12/2020 1140   ALBUMIN 3.7 04/12/2020 1140   AST 15 04/12/2020 1140   AST 19 05/12/2019 1145   ALT 13 04/12/2020 1140   ALT 16 05/12/2019 1145   ALKPHOS 33 (L) 04/12/2020 1140    BILITOT 0.4 04/12/2020 1140   BILITOT 0.3 05/12/2019 1145   GFRNONAA >60 04/12/2020 1140   GFRNONAA >60 05/12/2019 1145   GFRAA >  60 04/12/2020 1140   GFRAA >60 05/12/2019 1145    No results found for: TOTALPROTELP, ALBUMINELP, A1GS, A2GS, BETS, BETA2SER, GAMS, MSPIKE, SPEI  No results found for: KPAFRELGTCHN, LAMBDASER, KAPLAMBRATIO  Lab Results  Component Value Date   WBC 4.5 04/12/2020   NEUTROABS 3.2 04/12/2020   HGB 11.3 (L) 04/12/2020   HCT 33.8 (L) 04/12/2020   MCV 91.6 04/12/2020   PLT 233 04/12/2020    No results found for: LABCA2  No components found for: DGUYQI347  No results for input(s): INR in the last 168 hours.  No results found for: LABCA2  No results found for: QQV956  No results found for: LOV564  No results found for: PPI951  No results found for: CA2729  No components found for: HGQUANT  No results found for: CEA1 / No results found for: CEA1   No results found for: AFPTUMOR  No results found for: CHROMOGRNA  No results found for: HGBA, HGBA2QUANT, HGBFQUANT, HGBSQUAN (Hemoglobinopathy evaluation)   No results found for: LDH  No results found for: IRON, TIBC, IRONPCTSAT (Iron and TIBC)  No results found for: FERRITIN  Urinalysis No results found for: COLORURINE, APPEARANCEUR, LABSPEC, PHURINE, GLUCOSEU, HGBUR, BILIRUBINUR, KETONESUR, PROTEINUR, UROBILINOGEN, NITRITE, LEUKOCYTESUR   STUDIES: No results found.  ELIGIBLE FOR AVAILABLE RESEARCH PROTOCOL: no  ASSESSMENT: 59 y.o. [place] woman status post left breast biopsy 02/12/2019 for a clinical T1b N0, stage IA invasive ductal carcinoma, estrogen receptor positive, progesterone receptor negative, HER-2 amplified, with an MIB-1-1 of 25%  (1) status post left lumpectomy and sentinel lymph node sampling 03/05/2019 for a pT1c pN0, stage IA invasive ductal carcinoma, grade 2, with positive lymphovascular invasion but negative margins  (a) a total of 8 lymph nodes removed (4  sentinel)   (2) adjuvant chemotherapy with paclitaxel and trastuzumab weekly x12 started 04/07/2019  (a) paclitaxel stopped after 4 doses because of peripheral neuropathy  (b) cyclophosphamide, methotrexate, fluorouracil started 05/12/2019, to be repeated q. 21 days x 3  (3) trastuzumab to be continued to complete 1 year (through August 2021)  (a) echo 02/26/2019 shows an ejection fraction in the 60-65% range  (b) echo 05/27/2019 shows an EF of 55-60%  (c) echo 08/07/2019 shows an ejection fraction in the 60-65% range  (d) echo 11/19/2019 shows an ejection fraction in the 60-65% range  (4) adjuvant radiation 07/15/2019 - 08/13/2019  (a) left breast / 40.05 Gy in 15 fractions  (b) boost / 12 Gy in 6 fractions  (5) tamoxifen started 10/17/2019  (6) genetics testing 02/25/2019 through the Multi-Gene Panel offered by [facility] found no deleterious mutations in AIP, ALK, APC, ATM, AXIN2,BAP1,  BARD1, BLM, BMPR1A, BRCA1, BRCA2, BRIP1, CASR, CDC73, CDH1, CDK4, CDKN1B, CDKN1C, CDKN2A (p14ARF), CDKN2A (p16INK4a), CEBPA, CHEK2, CTNNA1, DICER1, DIS3L2, EGFR (c.2369C>T, p.Thr790Met variant only), EPCAM (Deletion/duplication testing only), FH, FLCN, GATA2, GPC3, GREM1 (Promoter region deletion/duplication testing only), HOXB13 (c.251G>A, p.Gly84Glu), HRAS, KIT, MAX, MEN1, MET, MITF (c.952G>A, p.Glu318Lys variant only), MLH1, MSH2, MSH3, MSH6, MUTYH, NBN, NF1, NF2, NTHL1, PALB2, PDGFRA, PHOX2B, PMS2, POLD1, POLE, POT1, PRKAR1A, PTCH1, PTEN, RAD50, RAD51C, RAD51D, RB1, RECQL4, RET, RNF43, RUNX1, SDHAF2, SDHA (sequence changes only), SDHB, SDHC, SDHD, SMAD4, SMARCA4, SMARCB1, SMARCE1, STK11, SUFU, TERC, TERT, TMEM127, TP53, TSC1, TSC2, VHL, WRN and WT1.     PLAN:  Allison Hanna continues to tolerate trastuzumab. Today is her last scheduled treatment.  We will proceed with her treatment today and will have her follow up with Dr. Jana Hakim or the [facility]. This appointment has not  yet been  scheduled.  Sandi Mealy, MHS, PA-C Medical Oncology and Hematology [facility] [place], [place] [address] Tel. [phone]    Fax. [phone]

## 2020-04-16 ENCOUNTER — Other Ambulatory Visit: Payer: Self-pay | Admitting: Oncology

## 2020-04-18 ENCOUNTER — Telehealth: Payer: Self-pay | Admitting: Oncology

## 2020-04-18 ENCOUNTER — Other Ambulatory Visit: Payer: Self-pay | Admitting: Oncology

## 2020-04-18 ENCOUNTER — Encounter: Payer: Self-pay | Admitting: *Deleted

## 2020-04-18 NOTE — Telephone Encounter (Signed)
Scheduled apt per 8/21 sch msg- unable to reach pt or leave message. Mailed reminder letter with appt date and time

## 2020-05-16 ENCOUNTER — Other Ambulatory Visit: Payer: Self-pay | Admitting: *Deleted

## 2020-05-16 ENCOUNTER — Telehealth: Payer: Self-pay | Admitting: *Deleted

## 2020-05-16 DIAGNOSIS — C50512 Malignant neoplasm of lower-outer quadrant of left female breast: Secondary | ICD-10-CM

## 2020-05-16 NOTE — Telephone Encounter (Signed)
Pt called with c/o lump at left lumpectomy scar. Ordered left diagnostic mammo/US/bx per Dr. Donne Hazel. Informed pt of recommendations. Denies further needs at this time.

## 2020-05-23 ENCOUNTER — Ambulatory Visit
Admission: RE | Admit: 2020-05-23 | Discharge: 2020-05-23 | Disposition: A | Discharge status: Home / Self Care | Payer: Managed Care, Other (non HMO) | Source: Ambulatory Visit | Attending: General Surgery | Admitting: General Surgery

## 2020-05-23 ENCOUNTER — Other Ambulatory Visit: Payer: Self-pay | Admitting: General Surgery

## 2020-05-23 ENCOUNTER — Other Ambulatory Visit: Payer: Self-pay

## 2020-05-23 DIAGNOSIS — C50512 Malignant neoplasm of lower-outer quadrant of left female breast: Secondary | ICD-10-CM

## 2020-05-23 DIAGNOSIS — Z170 Estrogen receptor positive status [ER+]: Secondary | ICD-10-CM

## 2020-05-24 ENCOUNTER — Ambulatory Visit
Admission: RE | Admit: 2020-05-24 | Discharge: 2020-05-24 | Disposition: A | Discharge status: Home / Self Care | Payer: Managed Care, Other (non HMO) | Source: Ambulatory Visit | Attending: General Surgery | Admitting: General Surgery

## 2020-05-24 DIAGNOSIS — C50512 Malignant neoplasm of lower-outer quadrant of left female breast: Secondary | ICD-10-CM

## 2020-05-24 DIAGNOSIS — Z170 Estrogen receptor positive status [ER+]: Secondary | ICD-10-CM

## 2020-05-24 HISTORY — PX: BREAST BIOPSY: SHX20

## 2020-07-24 NOTE — Progress Notes (Signed)
[place]  [phone] [phone]     ID: Allison Hanna DOB: 1961-04-18  MR#: [id number]  MAU#:[id number]  Patient Care Team: Merrilee Seashore, MD as PCP - General (Internal Medicine) Mauro Kaufmann, RN as Oncology Nurse Navigator Rockwell Germany, RN as Oncology Nurse Navigator Thaddeus Evitts, Virgie Dad, MD as Consulting Physician (Oncology) Gery Pray, MD as Consulting Physician (Radiation Oncology) Fanny Skates, MD as Consulting Physician (General Surgery) Brien Few, MD as Consulting Physician (Obstetrics and Gynecology) Larey Dresser, MD as Consulting Physician (Cardiology) Feliberto Harts R (Inactive) Felipa Furnace, DPM as Consulting Physician (Podiatry) Rolm Bookbinder, MD as Consulting Physician (Dermatology) Thurman Coyer, DO as Consulting Physician (Sports Medicine) Chauncey Cruel, MD OTHER MD:  CHIEF COMPLAINT: Estrogen receptor positive breast cancer  CURRENT TREATMENT: tamoxifen   INTERVAL HISTORY: Allison Hanna returns today for follow-up of her estrogen receptor positive breast cancer.  She completed trastuzumab on 04/12/2020.  She had her port removed.  A final echocardiogram showed a normal ejection fraction  She continues on Tamoxifen.  She tolerates this well.  Originally she had mild hot flashes but they are pretty much resolved..  She is using Estrace cream 2-3 times per week and notes that her vaginal dryness and atrophy is much improved.  The stress urinary incontinence also has improved  Since her last visit, she underwent bilateral diagnostic mammography with tomography at [facility] on 02/24/2020 showing: breast density category C; no evidence of malignancy in either breast.  She presented with a palpable lump in the lateral left breast. She proceeded to left diagnostic mammogram and left axilla ultrasound on 05/23/2020 showing: breast density category C; 4-5 mm benign lymph node at palpable site in low  left axilla/axillary tail; however, palpable area is significantly larger than size of lymph node.  She proceeded to biopsy of the left axilla area in question on 05/24/2020. Pathology from the procedure ([id number]) showed: favor small benign lymph node.  This was felt to be concordant.  A left diagnostic mammography and ultrasonography has been scheduled for 6 months from now.   REVIEW OF SYSTEMS: Allison Hanna has gained a couple of pounds and this concerns her.  She thinks tamoxifen might be related to this.  She is eating fewer carbs.  She walks about a mile every morning.  There have been no unusual headaches visual changes cough phlegm production pleurisy shortness of breath or falls.  She has a slightly altered sensation in the right shin area, not on the left.  There is no weakness or motor deficit.  A detailed review of systems today was otherwise stable   COVID 19 VACCINATION STATUS: fully vaccinated [employer]), with booster 03/2020   HISTORY OF CURRENT ILLNESS: From the original intake note:  "Allison Hanna" presented with a palpable left breast mass. She initially experienced soreness to her left breast around April 2020 and tried to get a mammogram at this point, but due to pandemic concerns, the scan was delayed.  She did not actually feel a mass in the breast until June 1.  She underwent bilateral diagnostic mammography with tomography and left breast ultrasonography at [facility] on 02/11/2019 showing: breast density category B; irregular hyperechoic mass in the left breast at 4 o'clock 5 cm from the nipple measuring 8 mm; no enlarged adenopathy in the left axilla.  Accordingly on 02/12/2019 she proceeded to biopsy of the left breast area in question. The pathology from this procedure ([id number]) showed: invasive mammary carcinoma, e-cadherin  positive. Prognostic indicators significant for: estrogen receptor, 100% positive with strong staining intensity and progesterone receptor, 0%  negative. Proliferation marker Ki67 at 25%. HER2 positive by immunohistochemistry, (3+).  The patient's subsequent history is as detailed below.   PAST MEDICAL HISTORY: Past Medical History:  Diagnosis Date  . Anxiety   . Family history of breast cancer   . Family history of melanoma   . High cholesterol   . Hypothyroid   . Paralyzed vocal cords 2014  . Personal history of chemotherapy   . Personal history of radiation therapy   . Raynauds syndrome    cold weather triggers fingers to turn white  History of palpitations (resolved)   PAST SURGICAL HISTORY: Past Surgical History:  Procedure Laterality Date  . BREAST LUMPECTOMY Left 02/2019  . BREAST LUMPECTOMY WITH RADIOACTIVE SEED AND SENTINEL LYMPH NODE BIOPSY Left 03/05/2019   Procedure: LEFT BREAST LUMPECTOMY WITH RADIOACTIVE SEED AND LEFT AXILLARY DEEP SENTINEL LYMPH NODE BIOPSY INJECT BLUE DYE LEFT BREAST;  Surgeon: Fanny Skates, MD;  Location: [facility];  Service: General;  Laterality: Left;  . KNEE SURGERY Bilateral    arthroscopy for menicus tears  . PORTACATH PLACEMENT Right 03/05/2019   Procedure: INSERTION PORT-A-CATH;  Surgeon: Fanny Skates, MD;  Location: [facility];  Service: General;  Laterality: Right;  . WRIST SURGERY      FAMILY HISTORY: Family History  Problem Relation Age of Onset  . Breast cancer Mother 11  . Cervical cancer Mother   . Heart attack Father   . Skin cancer Brother   . Breast cancer Paternal Aunt        dx over 53s  . Breast cancer Paternal Aunt        dx over 24  . Uterine cancer Paternal Aunt 58  . Cervical cancer Maternal Aunt   . Liver cancer Paternal Uncle 54  . Stroke Maternal Grandmother   . Parkinson's disease Paternal Grandfather   . Breast cancer Cousin 75       mother's maternal first cousin  . Melanoma Cousin 65       pat first cousin  Patient's father was 37 years old when he died from heart attack. Patient's mother died at age 6. She  was diagnosed with breast cancer at age 71. Two paternal aunts were diagnosed with breast cancer at older ages, one of which also had uterine cancer. She has 1 brother. He has a history of skin cancer.    GYNECOLOGIC HISTORY:  No LMP recorded. Patient is postmenopausal. Menarche: 59 years old Age at first live birth: 59 years old [place] P 3 LMP 2007 Contraceptive: yes, pill for about 8 years, no complications HRT no  Hysterectomy? no BSO? 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 She is married. [name] is in charge of operations for [employer]. She lives at home with her husband. [name], age 12, lives in [place], [place] as a Research scientist (life sciences). [name], age 82, lives in [place] as a Careers adviser. [name], age 59, lives in [place] as a Ship broker and works part-time in [employer]. She has no grandchildren.     ADVANCED DIRECTIVES: [name] is automatically her HCPOA.   HEALTH MAINTENANCE: Social History   Tobacco Use  . Smoking status: Never Smoker  . Smokeless tobacco: Never Used  Substance Use Topics  . Alcohol use: Yes  Alcohol/week: 2.0 standard drinks    Types: 2 Standard drinks or equivalent per week  . Drug use: Never     Colonoscopy: 2010  PAP: 08/2018  Bone density: 2019, osteopenia   No Known Allergies  Current Outpatient Medications  Medication Sig Dispense Refill  . B Complex-Folic Acid (SUPER B COMPLEX MAXI) TABS Take by mouth.    . bimatoprost (LATISSE) 0.03 % ophthalmic solution SMARTSIG:1 Drop(s) Right Eye As Directed    . calcium carbonate (OS-CAL) 600 MG TABS tablet Take 1,200 mg by mouth 2 (two) times daily with a meal.    . Cholecalciferol (VITAMIN D3) 25 MCG (1000 UT) CAPS Take 2,000 Units by mouth daily.    Marland Kitchen estradiol (ESTRACE VAGINAL) 0.1 MG/GM vaginal cream Place 1 Applicatorful vaginally at bedtime. 42.5 g 12  .  Levothyroxine Sodium (SYNTHROID PO) Take 75 mcg by mouth daily.     . Magnesium 250 MG TABS Take 250 mg by mouth daily.    . rosuvastatin (CRESTOR) 5 MG tablet Take 1 tablet (5 mg total) by mouth daily. 30 tablet 12  . tamoxifen (NOLVADEX) 20 MG tablet Take 1 tablet (20 mg total) by mouth daily. 90 tablet 12  . tretinoin (RETIN-A) 3.81 % cream APPLICATIONS APPLY A PEA SIZE TO FACE NIGHTLY AS DIRECTED    . vitamin C (ASCORBIC ACID) 500 MG tablet Take 500 mg by mouth daily.     No current facility-administered medications for this visit.   Facility-Administered Medications Ordered in Other Visits  Medication Dose Route Frequency Provider Last Rate Last Admin  . sodium chloride flush (NS) 0.9 % injection 10 mL  10 mL Intracatheter PRN Raelyn Racette, Virgie Dad, MD        OBJECTIVE: White woman in no acute distress  Vitals:   07/25/20 1132  BP: (!) 149/79  Pulse: 71  Resp: 18  Temp: 97.7 F (36.5 C)  SpO2: 100%   Wt Readings from Last 3 Encounters:  07/25/20 162 lb 11.2 oz (73.8 kg)  04/12/20 163 lb 7 oz (74.1 kg)  03/22/20 164 lb 8 oz (74.6 kg)   Body mass index is 25.48 kg/m.    ECOG FS:1 - Symptomatic but completely ambulatory  Sclerae unicteric, EOMs intact Wearing a mask No cervical or supraclavicular adenopathy Lungs no rales or rhonchi Heart regular rate and rhythm Abd soft, nontender, positive bowel sounds MSK no focal spinal tenderness, no upper extremity lymphedema Neuro: nonfocal, well oriented, appropriate affect Breasts: Right breast is unremarkable.  The left breast is status post lumpectomy and radiation.  There is no evidence of local recurrence.  The right axilla is benign.  In the left axilla inferiorly there is a palpable mass measuring approximately 1-1/2 cm and fairly firm.  It is slightly tender.  There is no overlying erythema.   LAB RESULTS:  CMP     Component Value Date/Time   NA 135 07/25/2020 1112   K 4.0 07/25/2020 1112   CL 101 07/25/2020 1112    CO2 25 07/25/2020 1112   GLUCOSE 99 07/25/2020 1112   BUN 21 07/25/2020 1112   CREATININE 0.90 07/25/2020 1112   CREATININE 0.81 05/12/2019 1145   CALCIUM 9.2 07/25/2020 1112   PROT 7.4 07/25/2020 1112   ALBUMIN 3.9 07/25/2020 1112   AST 19 07/25/2020 1112   AST 19 05/12/2019 1145   ALT 16 07/25/2020 1112   ALT 16 05/12/2019 1145   ALKPHOS 37 (L) 07/25/2020 1112   BILITOT 0.4 07/25/2020 1112   BILITOT  0.3 05/12/2019 1145   GFRNONAA >60 07/25/2020 1112   GFRNONAA >60 05/12/2019 1145   GFRAA >60 04/12/2020 1140   GFRAA >60 05/12/2019 1145    No results found for: TOTALPROTELP, ALBUMINELP, A1GS, A2GS, BETS, BETA2SER, GAMS, MSPIKE, SPEI  No results found for: KPAFRELGTCHN, LAMBDASER, KAPLAMBRATIO  Lab Results  Component Value Date   WBC 4.5 07/25/2020   NEUTROABS 3.1 07/25/2020   HGB 11.9 (L) 07/25/2020   HCT 35.6 (L) 07/25/2020   MCV 92.0 07/25/2020   PLT 259 07/25/2020    No results found for: LABCA2  No components found for: [id number]  No results for input(s): INR in the last 168 hours.  No results found for: LABCA2  No results found for: JKK938  No results found for: HWE993  No results found for: ZJI967  No results found for: CA2729  No components found for: HGQUANT  No results found for: CEA1 / No results found for: CEA1   No results found for: AFPTUMOR  No results found for: CHROMOGRNA  No results found for: HGBA, HGBA2QUANT, HGBFQUANT, HGBSQUAN (Hemoglobinopathy evaluation)   No results found for: LDH  No results found for: IRON, TIBC, IRONPCTSAT (Iron and TIBC)  No results found for: FERRITIN  Urinalysis No results found for: COLORURINE, APPEARANCEUR, LABSPEC, PHURINE, GLUCOSEU, HGBUR, BILIRUBINUR, KETONESUR, PROTEINUR, UROBILINOGEN, NITRITE, LEUKOCYTESUR   STUDIES: No results found.  ELIGIBLE FOR AVAILABLE RESEARCH PROTOCOL: no  ASSESSMENT: 59 y.o. [place] woman status post left breast biopsy 02/12/2019 for a clinical T1b N0, stage  IA invasive ductal carcinoma, estrogen receptor positive, progesterone receptor negative, HER-2 amplified, with an MIB-1-1 of 25%  (1) status post left lumpectomy and sentinel lymph node sampling 03/05/2019 for a pT1c pN0, stage IA invasive ductal carcinoma, grade 2, with positive lymphovascular invasion but negative margins  (a) a total of 8 lymph nodes removed (4 sentinel)   (2) adjuvant chemotherapy with paclitaxel and trastuzumab weekly x12 started 04/07/2019  (a) paclitaxel stopped after 4 doses because of peripheral neuropathy  (b) cyclophosphamide, methotrexate, fluorouracil started 05/12/2019, to be repeated q. 21 days x 3  (3) trastuzumab to be continued to complete 1 year (through August 2021)  (a) echo 02/26/2019 shows an ejection fraction in the 60-65% range  (b) echo 05/27/2019 shows an EF of 55-60%  (c) echo 08/07/2019 shows an ejection fraction in the 60-65% range  (d) echo 11/19/2019 shows an ejection fraction in the 60-65% range  (e) echo 03/03/2020 with an ejection fraction in the 55-60% range   (4) adjuvant radiation 07/15/2019 - 08/13/2019  (a) left breast / 40.05 Gy in 15 fractions  (b) boost / 12 Gy in 6 fractions  (5) tamoxifen started 10/17/2019  (6) genetics testing 02/25/2019 through the Multi-Gene Panel offered by [facility] found no deleterious mutations in AIP, ALK, APC, ATM, AXIN2,BAP1,  BARD1, BLM, BMPR1A, BRCA1, BRCA2, BRIP1, CASR, CDC73, CDH1, CDK4, CDKN1B, CDKN1C, CDKN2A (p14ARF), CDKN2A (p16INK4a), CEBPA, CHEK2, CTNNA1, DICER1, DIS3L2, EGFR (c.2369C>T, p.Thr790Met variant only), EPCAM (Deletion/duplication testing only), FH, FLCN, GATA2, GPC3, GREM1 (Promoter region deletion/duplication testing only), HOXB13 (c.251G>A, p.Gly84Glu), HRAS, KIT, MAX, MEN1, MET, MITF (c.952G>A, p.Glu318Lys variant only), MLH1, MSH2, MSH3, MSH6, MUTYH, NBN, NF1, NF2, NTHL1, PALB2, PDGFRA, PHOX2B, PMS2, POLD1, POLE, POT1, PRKAR1A, PTCH1, PTEN, RAD50, RAD51C, RAD51D, RB1, RECQL4, RET,  RNF43, RUNX1, SDHAF2, SDHA (sequence changes only), SDHB, SDHC, SDHD, SMAD4, SMARCA4, SMARCB1, SMARCE1, STK11, SUFU, TERC, TERT, TMEM127, TP53, TSC1, TSC2, VHL, WRN and WT1.     PLAN: Allison Hanna is tolerating tamoxifen well and the  plan will be to continue that a minimum of 5 years.  She remains concerned about the left axillary lymph node.  We reviewed its radiologic appearance, the fact that it seems concordant to radiology, and the pathology results.  Nevertheless it is persisting and I think we need to follow this closely.  That is why I am going to see her again in February 2022.  I encouraged her to increase her walking program to twice a day.  I reassured her that the right shin change in sensation, which is mild, may resolve on its own and it does not indicate cancer in that area.  Total encounter time 25 minutes.  Virgie Dad. Aylla Huffine, MD 07/25/20 12:07 PM Medical Oncology and Hematology [facility] [place], [place] [address] Tel. [phone]    Fax. [phone]   I, Wilburn Mylar, am acting as scribe for Dr. Virgie Dad. Johan Creveling.  I, [name] MD, have reviewed the above documentation for accuracy and completeness, and I agree with the above.   *Total Encounter Time as defined by the Centers for Medicare and Medicaid Services includes, in addition to the face-to-face time of a patient visit (documented in the note above) non-face-to-face time: obtaining and reviewing outside history, ordering and reviewing medications, tests or procedures, care coordination (communications with other health care professionals or caregivers) and documentation in the medical record.

## 2020-07-25 ENCOUNTER — Inpatient Hospital Stay: Payer: Managed Care, Other (non HMO)

## 2020-07-25 ENCOUNTER — Inpatient Hospital Stay: Payer: Managed Care, Other (non HMO) | Attending: Oncology | Admitting: Oncology

## 2020-07-25 ENCOUNTER — Other Ambulatory Visit: Payer: Self-pay

## 2020-07-25 VITALS — BP 149/79 | HR 71 | Temp 97.7°F | Resp 18 | Ht 67.0 in | Wt 162.7 lb

## 2020-07-25 DIAGNOSIS — C50512 Malignant neoplasm of lower-outer quadrant of left female breast: Secondary | ICD-10-CM | POA: Diagnosis not present

## 2020-07-25 DIAGNOSIS — Z170 Estrogen receptor positive status [ER+]: Secondary | ICD-10-CM | POA: Diagnosis not present

## 2020-07-25 DIAGNOSIS — Z23 Encounter for immunization: Secondary | ICD-10-CM | POA: Insufficient documentation

## 2020-07-25 DIAGNOSIS — C50912 Malignant neoplasm of unspecified site of left female breast: Secondary | ICD-10-CM | POA: Diagnosis not present

## 2020-07-25 LAB — CBC WITH DIFFERENTIAL/PLATELET
Abs Immature Granulocytes: 0.01 10*3/uL (ref 0.00–0.07)
Basophils Absolute: 0.1 10*3/uL (ref 0.0–0.1)
Basophils Relative: 1 %
Eosinophils Absolute: 0.1 10*3/uL (ref 0.0–0.5)
Eosinophils Relative: 1 %
HCT: 35.6 % — ABNORMAL LOW (ref 36.0–46.0)
Hemoglobin: 11.9 g/dL — ABNORMAL LOW (ref 12.0–15.0)
Immature Granulocytes: 0 %
Lymphocytes Relative: 20 %
Lymphs Abs: 0.9 10*3/uL (ref 0.7–4.0)
MCH: 30.7 pg (ref 26.0–34.0)
MCHC: 33.4 g/dL (ref 30.0–36.0)
MCV: 92 fL (ref 80.0–100.0)
Monocytes Absolute: 0.4 10*3/uL (ref 0.1–1.0)
Monocytes Relative: 8 %
Neutro Abs: 3.1 10*3/uL (ref 1.7–7.7)
Neutrophils Relative %: 70 %
Platelets: 259 10*3/uL (ref 150–400)
RBC: 3.87 MIL/uL (ref 3.87–5.11)
RDW: 12.6 % (ref 11.5–15.5)
WBC: 4.5 10*3/uL (ref 4.0–10.5)
nRBC: 0 % (ref 0.0–0.2)

## 2020-07-25 LAB — COMPREHENSIVE METABOLIC PANEL
ALT: 16 U/L (ref 0–44)
AST: 19 U/L (ref 15–41)
Albumin: 3.9 g/dL (ref 3.5–5.0)
Alkaline Phosphatase: 37 U/L — ABNORMAL LOW (ref 38–126)
Anion gap: 9 (ref 5–15)
BUN: 21 mg/dL (ref 8–23)
CO2: 25 mmol/L (ref 22–32)
Calcium: 9.2 mg/dL (ref 8.9–10.3)
Chloride: 101 mmol/L (ref 98–111)
Creatinine, Ser: 0.9 mg/dL (ref 0.44–1.00)
GFR, Estimated: >60 mL/min (ref >60)
Glucose, Bld: 99 mg/dL (ref 70–99)
Potassium: 4 mmol/L (ref 3.5–5.1)
Sodium: 135 mmol/L (ref 135–145)
Total Bilirubin: 0.4 mg/dL (ref 0.3–1.2)
Total Protein: 7.4 g/dL (ref 6.5–8.1)

## 2020-07-25 MED ORDER — INFLUENZA VAC SPLIT QUAD 0.5 ML IM SUSY
0.5000 mL | PREFILLED_SYRINGE | Freq: Once | INTRAMUSCULAR | Status: AC
  Administered 2020-07-25: 0.5 mL via INTRAMUSCULAR

## 2020-07-25 MED ORDER — TAMOXIFEN CITRATE 20 MG PO TABS
20.0000 mg | ORAL_TABLET | Freq: Every day | ORAL | 12 refills | Status: AC
Start: 2020-07-25 — End: 2020-08-24

## 2020-07-25 MED ORDER — INFLUENZA VAC SPLIT QUAD 0.5 ML IM SUSY
PREFILLED_SYRINGE | INTRAMUSCULAR | Status: AC
  Filled 2020-07-25: qty 0.5

## 2020-07-27 ENCOUNTER — Telehealth: Payer: Self-pay | Admitting: Oncology

## 2020-07-27 NOTE — Telephone Encounter (Signed)
Scheduled appts per 11/29 los. Left voicemail with appt date and time.

## 2020-08-06 ENCOUNTER — Other Ambulatory Visit: Payer: Self-pay | Admitting: Oncology

## 2020-09-22 IMAGING — US ULTRASOUND LEFT BREAST LIMITED
1 series · 9 of 9 positions shown · non-contrast
Comparison: Previous exam(s).

CLINICAL DATA: Patient complains of a palpable mass in the left
breast.

EXAM:
DIGITAL DIAGNOSTIC BILATERAL MAMMOGRAM WITH CAD AND TOMO
ULTRASOUND LEFT BREAST

[Series 1: ultrasound left breast limited · 0.06mm/px · 9 of 9 slices shown]
[im 1/9]
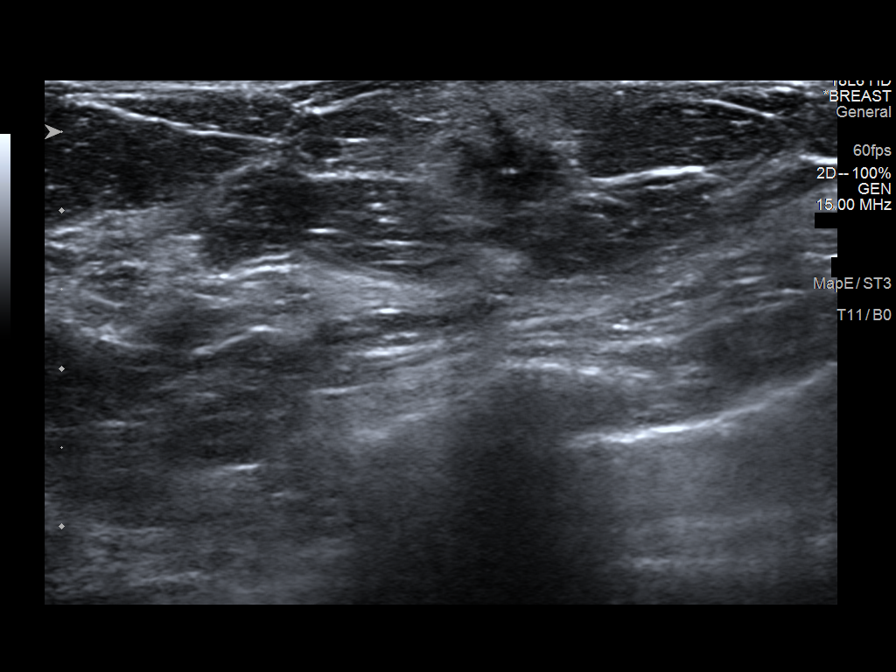
[im 2/9]
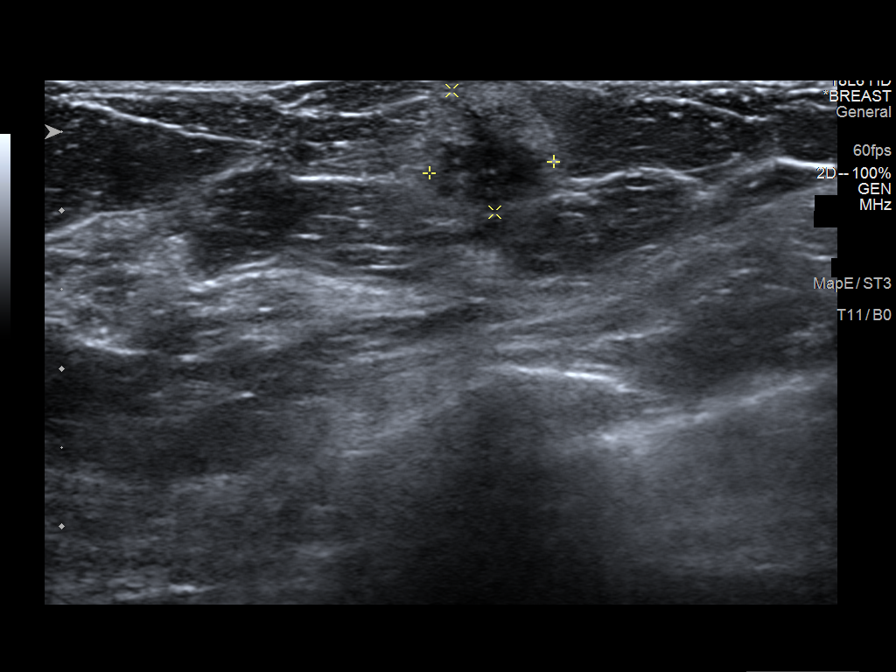
[im 3/9]
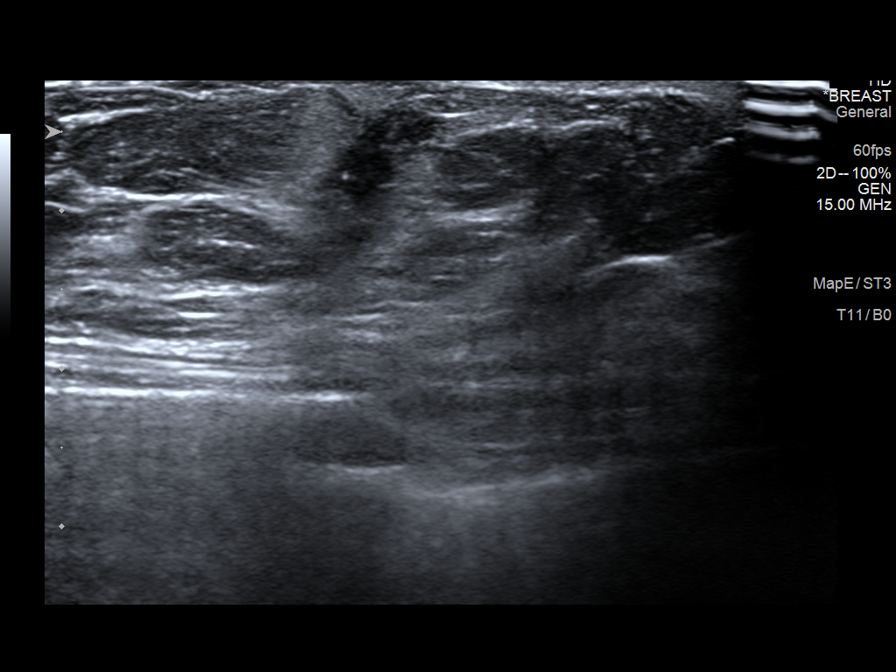
[im 4/9]
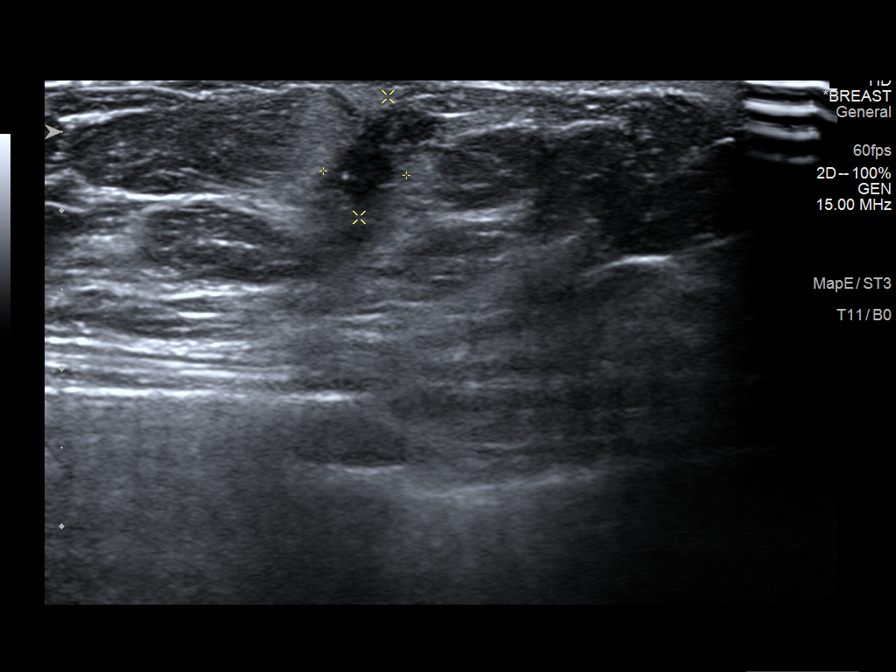
[im 5/9]
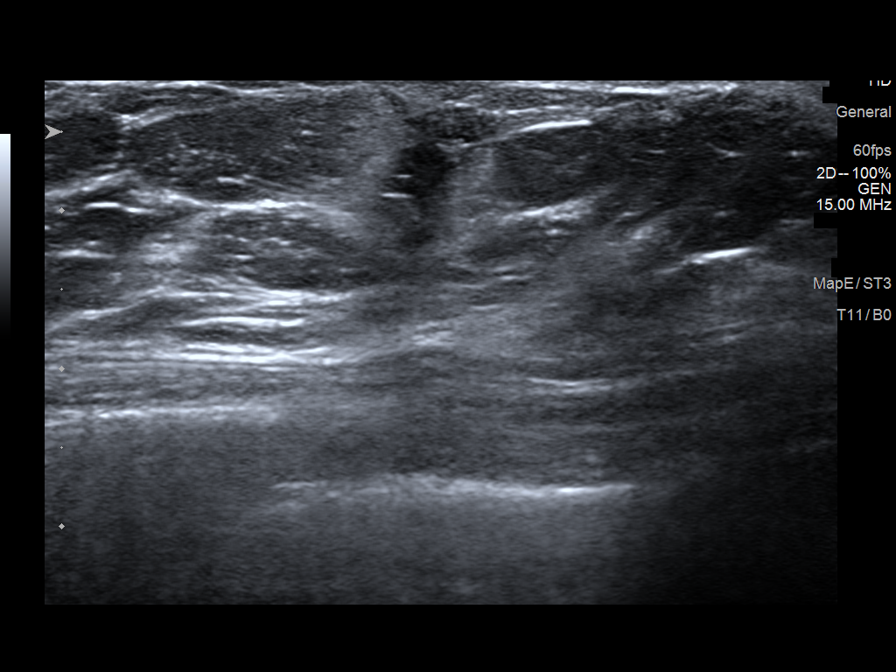
[im 6/9]
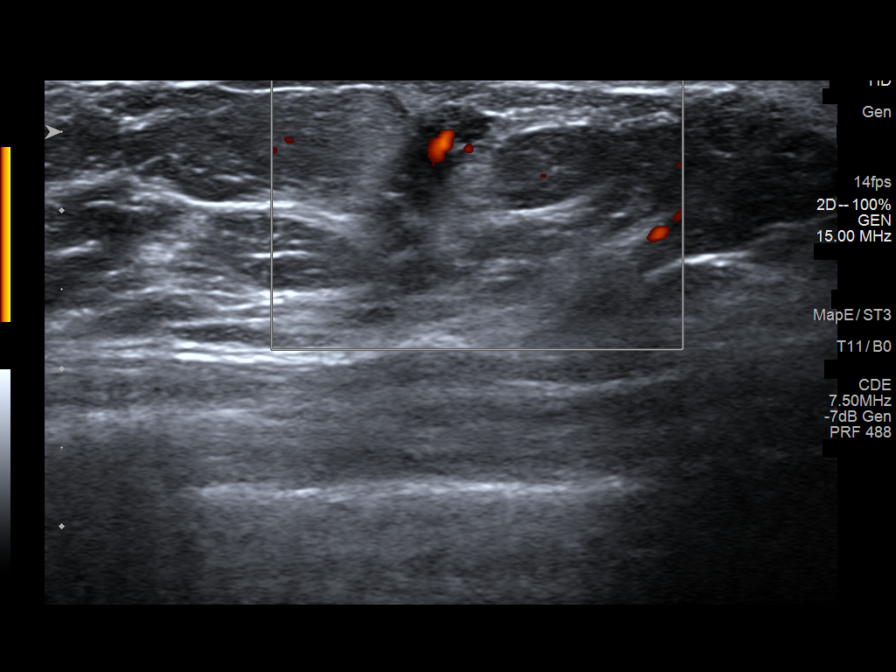
[im 7/9]
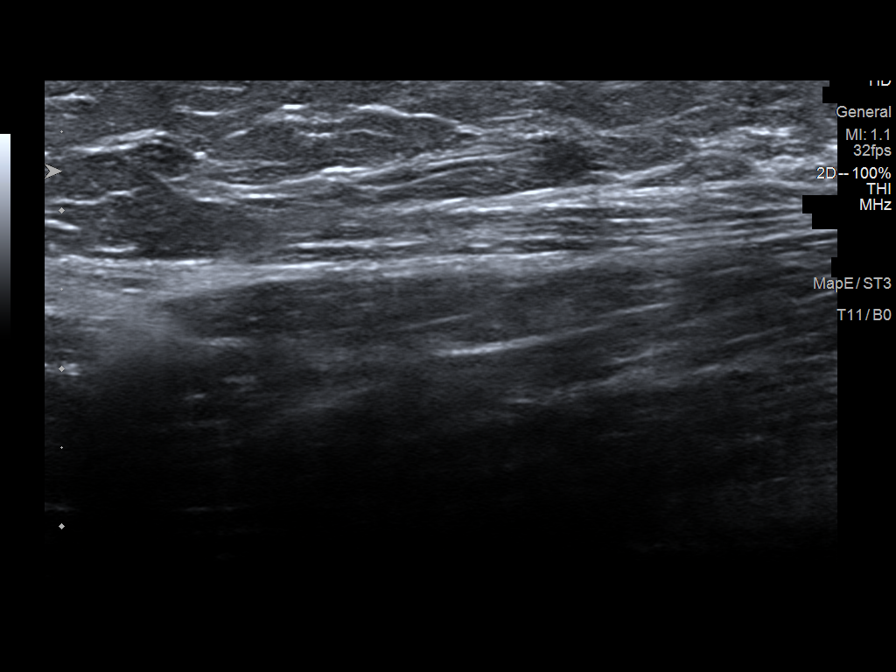
[im 8/9]
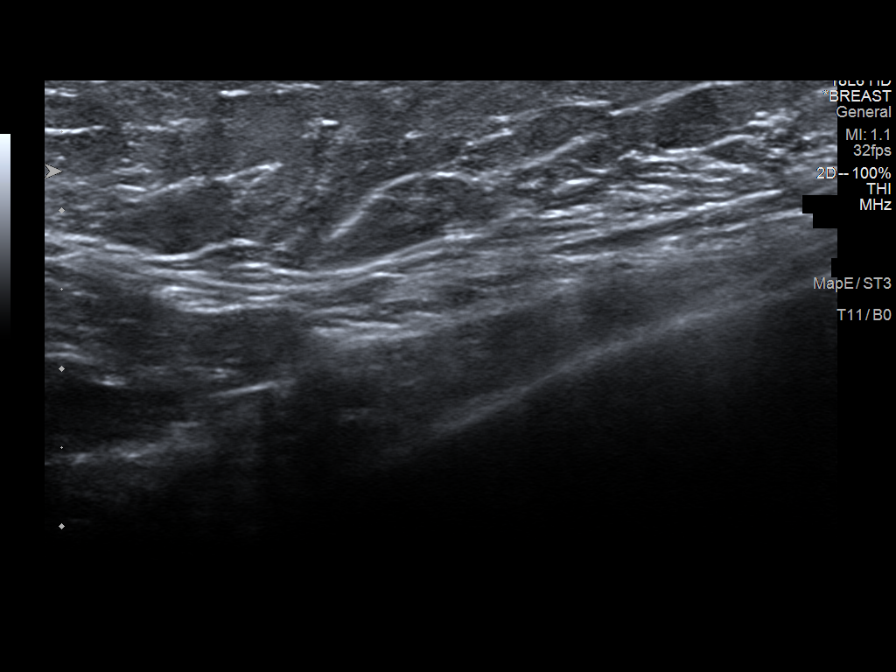
[im 9/9]
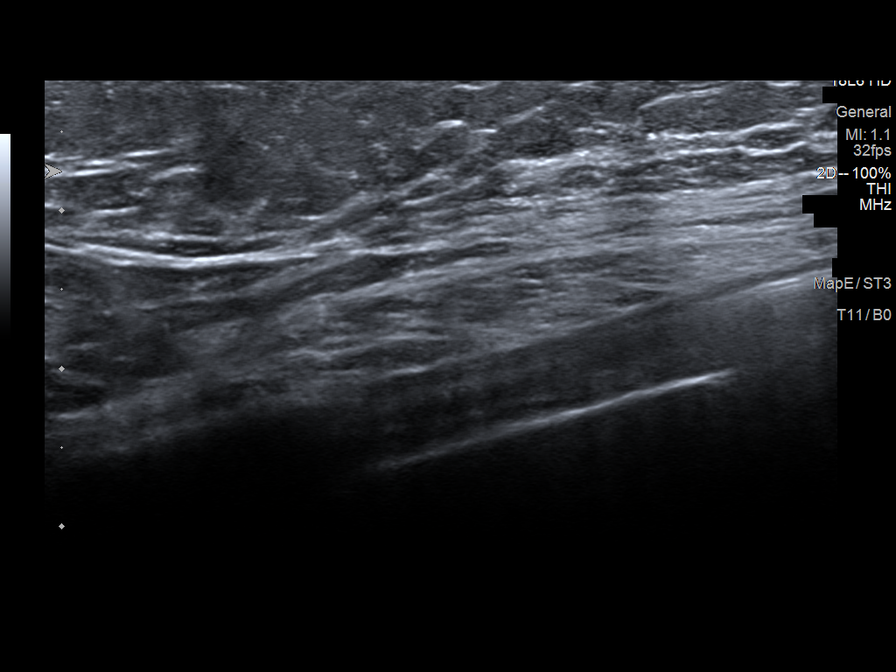

[9 of 9 positions shown; findings below may reference images not displayed]

ACR Breast Density Category b: There are scattered areas of
fibroglandular density.
FINDINGS: There is a spiculated mass in the lower outer quadrant of the left
breast. There are no malignant type microcalcifications in the left
breast.

No suspicious mass or malignant type microcalcifications identified
in the right breast.

Mammographic images were processed with CAD.

On physical exam, I palpate a discrete mass in the left breast at 4
o'clock 5 cm from the nipple.

Targeted ultrasound is performed, showing an irregular hypoechoic
mass in the left breast at 4 o'clock 5 cm from the nipple measuring
8 x 8 x 5 mm. Sonographic evaluation of the left axilla does not
show any enlarged adenopathy.
IMPRESSION: Suspicious left breast mass.

RECOMMENDATION:
Ultrasound-guided core biopsy of the mass in the 4 o'clock region of
the left breast is recommended. The biopsy will be scheduled the
patient's convenience.

I have discussed the findings and recommendations with the patient.
Results were also provided in writing at the conclusion of the
visit. If applicable, a reminder letter will be sent to the patient
regarding the next appointment.

BI-RADS CATEGORY  5: Highly suggestive of malignancy.

## 2020-10-06 ENCOUNTER — Encounter (INDEPENDENT_AMBULATORY_CARE_PROVIDER_SITE_OTHER): Payer: Self-pay

## 2020-10-23 NOTE — Progress Notes (Signed)
[place]  [phone] [phone]     ID: Allison Hanna DOB: 06-03-1961  MR#: [id number]  EYC#:[id number]  Patient Care Team: Merrilee Seashore, MD as PCP - General (Internal Medicine) Mauro Kaufmann, RN as Oncology Nurse Navigator Rockwell Germany, RN as Oncology Nurse Navigator Kaliya Shreiner, Virgie Dad, MD as Consulting Physician (Oncology) Gery Pray, MD as Consulting Physician (Radiation Oncology) Fanny Skates, MD as Consulting Physician (General Surgery) Brien Few, MD as Consulting Physician (Obstetrics and Gynecology) Larey Dresser, MD as Consulting Physician (Cardiology) Feliberto Harts R (Inactive) Felipa Furnace, DPM as Consulting Physician (Podiatry) Rolm Bookbinder, MD as Consulting Physician (Dermatology) Thurman Coyer, DO as Consulting Physician (Sports Medicine) Chauncey Cruel, MD OTHER MD:  CHIEF COMPLAINT: Estrogen receptor positive breast cancer  CURRENT TREATMENT: tamoxifen   INTERVAL HISTORY: Allison Hanna returns today for follow-up of her estrogen receptor positive breast cancer.  She continues on Tamoxifen.  She tolerates this well.  Originally she had mild hot flashes but they are pretty much resolved.  Vaginal wetness is not a concern.  She is using Estrace cream 2-3 times per week and notes that her vaginal dryness and atrophy is much improved.  The stress urinary incontinence also has improved  Since her last visit, she has not undergone any additional studies.   REVIEW OF SYSTEMS: [name] remains appropriately concerned regarding the persistent mass in her left axilla.  She wonders if it is inflammation why it has not either gotten worse or better.  Aside from that she is exercising by walking at least 1-1/2 miles most mornings and also playing tennis.  She is still rehabbing her right shoulder from the recent rotator cuff surgery.  She is concerned about weight gain.  A detailed review of systems today was  otherwise stable   COVID 19 VACCINATION STATUS: fully vaccinated [employer]), with booster 03/2020   HISTORY OF CURRENT ILLNESS: From the original intake note:  "Allison Hanna" presented with a palpable left breast mass. She initially experienced soreness to her left breast around April 2020 and tried to get a mammogram at this point, but due to pandemic concerns, the scan was delayed.  She did not actually feel a mass in the breast until June 1.  She underwent bilateral diagnostic mammography with tomography and left breast ultrasonography at [facility] on 02/11/2019 showing: breast density category B; irregular hyperechoic mass in the left breast at 4 o'clock 5 cm from the nipple measuring 8 mm; no enlarged adenopathy in the left axilla.  Accordingly on 02/12/2019 she proceeded to biopsy of the left breast area in question. The pathology from this procedure ([id number]) showed: invasive mammary carcinoma, e-cadherin positive. Prognostic indicators significant for: estrogen receptor, 100% positive with strong staining intensity and progesterone receptor, 0% negative. Proliferation marker Ki67 at 25%. HER2 positive by immunohistochemistry, (3+).  The patient's subsequent history is as detailed below.   PAST MEDICAL HISTORY: Past Medical History:  Diagnosis Date  . Anxiety   . Family history of breast cancer   . Family history of melanoma   . High cholesterol   . Hypothyroid   . Paralyzed vocal cords 2014  . Personal history of chemotherapy   . Personal history of radiation therapy   . Raynauds syndrome    cold weather triggers fingers to turn white  History of palpitations (resolved)   PAST SURGICAL HISTORY: Past Surgical History:  Procedure Laterality Date  . BREAST LUMPECTOMY Left 02/2019  . BREAST LUMPECTOMY WITH RADIOACTIVE  SEED AND SENTINEL LYMPH NODE BIOPSY Left 03/05/2019   Procedure: LEFT BREAST LUMPECTOMY WITH RADIOACTIVE SEED AND LEFT AXILLARY DEEP SENTINEL LYMPH NODE BIOPSY  INJECT BLUE DYE LEFT BREAST;  Surgeon: Fanny Skates, MD;  Location: [facility];  Service: General;  Laterality: Left;  . KNEE SURGERY Bilateral    arthroscopy for menicus tears  . PORTACATH PLACEMENT Right 03/05/2019   Procedure: INSERTION PORT-A-CATH;  Surgeon: Fanny Skates, MD;  Location: [facility];  Service: General;  Laterality: Right;  . WRIST SURGERY      FAMILY HISTORY: Family History  Problem Relation Age of Onset  . Breast cancer Mother 50  . Cervical cancer Mother   . Heart attack Father   . Skin cancer Brother   . Breast cancer Paternal Aunt        dx over 28s  . Breast cancer Paternal Aunt        dx over 11  . Uterine cancer Paternal Aunt 75  . Cervical cancer Maternal Aunt   . Liver cancer Paternal Uncle 61  . Stroke Maternal Grandmother   . Parkinson's disease Paternal Grandfather   . Breast cancer Cousin 1       mother's maternal first cousin  . Melanoma Cousin 22       pat first cousin  Patient's father was 64 years old when he died from heart attack. Patient's mother died at age 21. She was diagnosed with breast cancer at age 53. Two paternal aunts were diagnosed with breast cancer at older ages, one of which also had uterine cancer. She has 1 brother. He has a history of skin cancer.    GYNECOLOGIC HISTORY:  No LMP recorded. Patient is postmenopausal. Menarche: 60 years old Age at first live birth: 60 years old [place] P 3 LMP 2007 Contraceptive: yes, pill for about 8 years, no complications HRT no  Hysterectomy? no BSO? 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 She is married. [name] is in charge of operations for [employer]. She lives at home with her husband. [name], age 28, lives in [place], [place] as a Research scientist (life sciences). [name], age 38, lives in [place] as a Careers adviser. [name], age 109, lives  in [place] as a Ship broker and works part-time in [employer]. She has no grandchildren.     ADVANCED DIRECTIVES: [name] is automatically her HCPOA.   HEALTH MAINTENANCE: Social History   Tobacco Use  . Smoking status: Never Smoker  . Smokeless tobacco: Never Used  Substance Use Topics  . Alcohol use: Yes    Alcohol/week: 2.0 standard drinks    Types: 2 Standard drinks or equivalent per week  . Drug use: Never     Colonoscopy: 2010  PAP: 08/2018  Bone density: 2019, osteopenia   No Known Allergies  Current Outpatient Medications  Medication Sig Dispense Refill  . B Complex-Folic Acid (SUPER B COMPLEX MAXI) TABS Take by mouth.    . bimatoprost (LATISSE) 0.03 % ophthalmic solution SMARTSIG:1 Drop(s) Right Eye As Directed    . calcium carbonate (OS-CAL) 600 MG TABS tablet Take 1,200 mg by mouth 2 (two) times daily with a meal.    . Cholecalciferol (VITAMIN D3) 25 MCG (1000 UT) CAPS Take 2,000 Units by mouth daily.    Marland Kitchen estradiol (ESTRACE VAGINAL) 0.1 MG/GM vaginal cream Place 1 Applicatorful vaginally at bedtime. 42.5 g 12  .  Levothyroxine Sodium (SYNTHROID PO) Take 75 mcg by mouth daily.     . Magnesium 250 MG TABS Take 250 mg by mouth daily.    . rosuvastatin (CRESTOR) 5 MG tablet TAKE 1 TABLET BY MOUTH EVERY DAY 90 tablet 4  . tretinoin (RETIN-A) 4.09 % cream APPLICATIONS APPLY A PEA SIZE TO FACE NIGHTLY AS DIRECTED    . vitamin C (ASCORBIC ACID) 500 MG tablet Take 500 mg by mouth daily.     No current facility-administered medications for this visit.   Facility-Administered Medications Ordered in Other Visits  Medication Dose Route Frequency Provider Last Rate Last Admin  . sodium chloride flush (NS) 0.9 % injection 10 mL  10 mL Intracatheter PRN Madden Garron, Virgie Dad, MD        OBJECTIVE: White woman in no acute distress  Vitals:   10/24/20 1201  BP: (!) 149/89  Pulse: 70  Resp: 18  Temp: 98 F (36.7 C)  SpO2: 100%   Wt Readings from Last 3 Encounters:   10/24/20 163 lb 12.8 oz (74.3 kg)  07/25/20 162 lb 11.2 oz (73.8 kg)  04/12/20 163 lb 7 oz (74.1 kg)   Body mass index is 25.65 kg/m.    ECOG FS:1 - Symptomatic but completely ambulatory  Sclerae unicteric, EOMs intact Wearing a mask No cervical or supraclavicular adenopathy Lungs no rales or rhonchi Heart regular rate and rhythm Abd soft, nontender, positive bowel sounds MSK no focal spinal tenderness, no upper extremity lymphedema Neuro: nonfocal, well oriented, appropriate affect Breasts: The right breast is unremarkable.  The left breast is status post lumpectomy and radiation with no evidence of local recurrence.  In the left axilla there is a very firm 1 cm mass which appears unchanged from the last visit.  There is no associated tenderness or inflammation.   LAB RESULTS:  CMP     Component Value Date/Time   NA 135 07/25/2020 1112   K 4.0 07/25/2020 1112   CL 101 07/25/2020 1112   CO2 25 07/25/2020 1112   GLUCOSE 99 07/25/2020 1112   BUN 21 07/25/2020 1112   CREATININE 0.90 07/25/2020 1112   CREATININE 0.81 05/12/2019 1145   CALCIUM 9.2 07/25/2020 1112   PROT 7.4 07/25/2020 1112   ALBUMIN 3.9 07/25/2020 1112   AST 19 07/25/2020 1112   AST 19 05/12/2019 1145   ALT 16 07/25/2020 1112   ALT 16 05/12/2019 1145   ALKPHOS 37 (L) 07/25/2020 1112   BILITOT 0.4 07/25/2020 1112   BILITOT 0.3 05/12/2019 1145   GFRNONAA >60 07/25/2020 1112   GFRNONAA >60 05/12/2019 1145   GFRAA >60 04/12/2020 1140   GFRAA >60 05/12/2019 1145    No results found for: TOTALPROTELP, ALBUMINELP, A1GS, A2GS, BETS, BETA2SER, GAMS, MSPIKE, SPEI  No results found for: KPAFRELGTCHN, LAMBDASER, KAPLAMBRATIO  Lab Results  Component Value Date   WBC 4.1 10/24/2020   NEUTROABS 2.7 10/24/2020   HGB 12.0 10/24/2020   HCT 36.1 10/24/2020   MCV 93.8 10/24/2020   PLT 241 10/24/2020    No results found for: LABCA2  No components found for: [id number]  No results for input(s): INR in the  last 168 hours.  No results found for: LABCA2  No results found for: NFA213  No results found for: YQM578  No results found for: ION629  No results found for: CA2729  No components found for: HGQUANT  No results found for: CEA1 / No results found for: CEA1   No results found for: [facility]  No results found for: CHROMOGRNA  No results found for: HGBA, HGBA2QUANT, HGBFQUANT, HGBSQUAN (Hemoglobinopathy evaluation)   No results found for: LDH  No results found for: IRON, TIBC, IRONPCTSAT (Iron and TIBC)  No results found for: FERRITIN  Urinalysis No results found for: COLORURINE, APPEARANCEUR, LABSPEC, PHURINE, GLUCOSEU, HGBUR, BILIRUBINUR, KETONESUR, PROTEINUR, UROBILINOGEN, NITRITE, LEUKOCYTESUR   STUDIES: No results found.   ELIGIBLE FOR AVAILABLE RESEARCH PROTOCOL: no  ASSESSMENT: 60 y.o. [place] woman status post left breast biopsy 02/12/2019 for a clinical T1b N0, stage IA invasive ductal carcinoma, estrogen receptor positive, progesterone receptor negative, HER-2 amplified, with an MIB-1-1 of 25%  (1) status post left lumpectomy and sentinel lymph node sampling 03/05/2019 for a pT1c pN0, stage IA invasive ductal carcinoma, grade 2, with positive lymphovascular invasion but negative margins  (a) a total of 8 lymph nodes removed (4 sentinel)   (2) adjuvant chemotherapy with paclitaxel and trastuzumab weekly x12 started 04/07/2019  (a) paclitaxel stopped after 4 doses because of peripheral neuropathy  (b) cyclophosphamide, methotrexate, fluorouracil started 05/12/2019, to be repeated q. 21 days x 3  (3) trastuzumab to be continued to complete 1 year (through August 2021)  (a) echo 02/26/2019 shows an ejection fraction in the 60-65% range  (b) echo 05/27/2019 shows an EF of 55-60%  (c) echo 08/07/2019 shows an ejection fraction in the 60-65% range  (d) echo 11/19/2019 shows an ejection fraction in the 60-65% range  (e) echo 03/03/2020 with an ejection fraction  in the 55-60% range   (4) adjuvant radiation 07/15/2019 - 08/13/2019  (a) left breast / 40.05 Gy in 15 fractions  (b) boost / 12 Gy in 6 fractions  (5) tamoxifen started 10/17/2019  (6) genetics testing 02/25/2019 through the Multi-Gene Panel offered by [facility] found no deleterious mutations in AIP, ALK, APC, ATM, AXIN2,BAP1,  BARD1, BLM, BMPR1A, BRCA1, BRCA2, BRIP1, CASR, CDC73, CDH1, CDK4, CDKN1B, CDKN1C, CDKN2A (p14ARF), CDKN2A (p16INK4a), CEBPA, CHEK2, CTNNA1, DICER1, DIS3L2, EGFR (c.2369C>T, p.Thr790Met variant only), EPCAM (Deletion/duplication testing only), FH, FLCN, GATA2, GPC3, GREM1 (Promoter region deletion/duplication testing only), HOXB13 (c.251G>A, p.Gly84Glu), HRAS, KIT, MAX, MEN1, MET, MITF (c.952G>A, p.Glu318Lys variant only), MLH1, MSH2, MSH3, MSH6, MUTYH, NBN, NF1, NF2, NTHL1, PALB2, PDGFRA, PHOX2B, PMS2, POLD1, POLE, POT1, PRKAR1A, PTCH1, PTEN, RAD50, RAD51C, RAD51D, RB1, RECQL4, RET, RNF43, RUNX1, SDHAF2, SDHA (sequence changes only), SDHB, SDHC, SDHD, SMAD4, SMARCA4, SMARCB1, SMARCE1, STK11, SUFU, TERC, TERT, TMEM127, TP53, TSC1, TSC2, VHL, WRN and WT1.     PLAN: Hani is now 2-1/2 years out from definitive surgery for her breast cancer with no evidence of disease recurrence.  This is very favorable.  She is tolerating tamoxifen well and the plan is to continue this for minimum of 5 years.  If she wishes to continue the estradiol cream then she might want to continue the tamoxifen for total of 10 years as that provides her a margin of safety for the use of vaginal estrogens  I share her concern regarding the mass in the left axilla.  If it is purely inflammatory it should have resolved.  I think we need to rebiopsy this and I have set her up for that in the next few days  Assuming that is benign she will have mammography again in early July and she will see me shortly after that test  She knows to call for any other issue that may develop before then  Total encounter  time 35 minutes.Sarajane Jews C. Dairon Procter, MD 10/24/20 12:23 PM Medical Oncology [facility] H[facility]ology [place]  [place], [place] [address] Tel. [phone]    Fax. [phone]   I, Wilburn Mylar, am acting as scribe for Dr. Virgie Dad. Addaleigh Nicholls.  I, [name] MD, have reviewed the above documentation for accuracy and completeness, and I agree with the above.   *Total Encounter Time as defined by the Centers for Medicare and Medicaid Services includes, in addition to the face-to-face time of a patient visit (documented in the note above) non-face-to-face time: obtaining and reviewing outside history, ordering and reviewing medications, tests or procedures, care coordination (communications with other health care professionals or caregivers) and documentation in the medical record.

## 2020-10-24 ENCOUNTER — Inpatient Hospital Stay: Payer: Managed Care, Other (non HMO) | Attending: Oncology | Admitting: Oncology

## 2020-10-24 ENCOUNTER — Other Ambulatory Visit: Payer: Self-pay

## 2020-10-24 ENCOUNTER — Inpatient Hospital Stay: Payer: Managed Care, Other (non HMO)

## 2020-10-24 VITALS — BP 149/89 | HR 70 | Temp 98.0°F | Resp 18 | Ht 67.0 in | Wt 163.8 lb

## 2020-10-24 DIAGNOSIS — Z79899 Other long term (current) drug therapy: Secondary | ICD-10-CM | POA: Insufficient documentation

## 2020-10-24 DIAGNOSIS — Z79810 Long term (current) use of selective estrogen receptor modulators (SERMs): Secondary | ICD-10-CM | POA: Insufficient documentation

## 2020-10-24 DIAGNOSIS — C50512 Malignant neoplasm of lower-outer quadrant of left female breast: Secondary | ICD-10-CM | POA: Diagnosis present

## 2020-10-24 DIAGNOSIS — Z170 Estrogen receptor positive status [ER+]: Secondary | ICD-10-CM

## 2020-10-24 DIAGNOSIS — E039 Hypothyroidism, unspecified: Secondary | ICD-10-CM | POA: Insufficient documentation

## 2020-10-24 DIAGNOSIS — Z803 Family history of malignant neoplasm of breast: Secondary | ICD-10-CM | POA: Insufficient documentation

## 2020-10-24 DIAGNOSIS — Z923 Personal history of irradiation: Secondary | ICD-10-CM | POA: Insufficient documentation

## 2020-10-24 LAB — COMPREHENSIVE METABOLIC PANEL
ALT: 16 U/L (ref 0–44)
AST: 19 U/L (ref 15–41)
Albumin: 4 g/dL (ref 3.5–5.0)
Alkaline Phosphatase: 33 U/L — ABNORMAL LOW (ref 38–126)
Anion gap: 7 (ref 5–15)
BUN: 13 mg/dL (ref 8–23)
CO2: 26 mmol/L (ref 22–32)
Calcium: 9.1 mg/dL (ref 8.9–10.3)
Chloride: 99 mmol/L (ref 98–111)
Creatinine, Ser: 0.8 mg/dL (ref 0.44–1.00)
GFR, Estimated: >60 mL/min (ref >60)
Glucose, Bld: 101 mg/dL — ABNORMAL HIGH (ref 70–99)
Potassium: 4.5 mmol/L (ref 3.5–5.1)
Sodium: 132 mmol/L — ABNORMAL LOW (ref 135–145)
Total Bilirubin: 0.2 mg/dL — ABNORMAL LOW (ref 0.3–1.2)
Total Protein: 7.2 g/dL (ref 6.5–8.1)

## 2020-10-24 LAB — CBC WITH DIFFERENTIAL/PLATELET
Abs Immature Granulocytes: 0.01 10*3/uL (ref 0.00–0.07)
Basophils Absolute: 0.1 10*3/uL (ref 0.0–0.1)
Basophils Relative: 1 %
Eosinophils Absolute: 0.1 10*3/uL (ref 0.0–0.5)
Eosinophils Relative: 2 %
HCT: 36.1 % (ref 36.0–46.0)
Hemoglobin: 12 g/dL (ref 12.0–15.0)
Immature Granulocytes: 0 %
Lymphocytes Relative: 20 %
Lymphs Abs: 0.8 10*3/uL (ref 0.7–4.0)
MCH: 31.2 pg (ref 26.0–34.0)
MCHC: 33.2 g/dL (ref 30.0–36.0)
MCV: 93.8 fL (ref 80.0–100.0)
Monocytes Absolute: 0.4 10*3/uL (ref 0.1–1.0)
Monocytes Relative: 11 %
Neutro Abs: 2.7 10*3/uL (ref 1.7–7.7)
Neutrophils Relative %: 66 %
Platelets: 241 10*3/uL (ref 150–400)
RBC: 3.85 MIL/uL — ABNORMAL LOW (ref 3.87–5.11)
RDW: 12.5 % (ref 11.5–15.5)
WBC: 4.1 10*3/uL (ref 4.0–10.5)
nRBC: 0 % (ref 0.0–0.2)

## 2020-10-26 ENCOUNTER — Telehealth: Payer: Self-pay | Admitting: Oncology

## 2020-10-26 NOTE — Telephone Encounter (Signed)
Scheduled appts per 2/28 los. Left voicemail with appt date/time.

## 2020-11-22 ENCOUNTER — Other Ambulatory Visit: Payer: Self-pay | Admitting: Oncology

## 2020-11-22 DIAGNOSIS — N63 Unspecified lump in unspecified breast: Secondary | ICD-10-CM

## 2020-11-25 ENCOUNTER — Ambulatory Visit
Admission: RE | Admit: 2020-11-25 | Discharge: 2020-11-25 | Disposition: A | Discharge status: Home / Self Care | Payer: Managed Care, Other (non HMO) | Source: Ambulatory Visit | Attending: Oncology | Admitting: Oncology

## 2020-11-25 ENCOUNTER — Other Ambulatory Visit: Payer: Self-pay | Admitting: Oncology

## 2020-11-25 ENCOUNTER — Other Ambulatory Visit: Payer: Self-pay

## 2020-11-25 DIAGNOSIS — N63 Unspecified lump in unspecified breast: Secondary | ICD-10-CM

## 2021-02-07 NOTE — Progress Notes (Signed)
Allison Hanna  Telephone:([phone]) [phone] Fax:(336) [phone]     ID: Allison Hanna DOB: 05-16-1961  MR#: [id number]  KCM#:[id number]  Patient Care Team: Merrilee Seashore, MD as PCP - General (Internal Medicine) Mauro Kaufmann, RN as Oncology Nurse Navigator Rockwell Germany, RN as Oncology Nurse Navigator Jannel Lynne, Virgie Dad, MD as Consulting Physician (Oncology) Gery Pray, MD as Consulting Physician (Radiation Oncology) Fanny Skates, MD as Consulting Physician (General Surgery) Brien Few, MD as Consulting Physician (Obstetrics and Gynecology) Larey Dresser, MD as Consulting Physician (Cardiology) Feliberto Harts R (Inactive) Felipa Furnace, DPM as Consulting Physician (Podiatry) Rolm Bookbinder, MD as Consulting Physician (Dermatology) Thurman Coyer, DO as Consulting Physician (Sports Medicine) Chauncey Cruel, MD OTHER MD:  CHIEF COMPLAINT: Estrogen receptor positive breast cancer  CURRENT TREATMENT: tamoxifen   INTERVAL HISTORY: Allison Hanna returns today for follow-up of her estrogen receptor positive breast cancer.  She continues on Tamoxifen.  She does "fine" with that without any significant concerns.  She is using Estrace cream 2 times per week now and notes that her vaginal dryness and atrophy is much improved.  The stress urinary incontinence also has improved  Since her last visit, she underwent short-term follow up left diagnostic mammogram and left axilla ultrasound at [facility] on 11/25/2020 showing: breast density category C; no evidence of new or recurrent left breast malignancy; small, biopsy-proven benign lymph node along lateral left chest, overall stable in size.  She is scheduled for annual bilateral mammography on 03/03/2021.   REVIEW OF SYSTEMS: Allison Hanna is back to work full-time.  She thinks her energy level is about back to normal although her stamina not yet quite.  She walks every day at least 30 minutes and then on  weekends she is very busy with housework.  She is having some insomnia issues.  She continues to have some soreness in the inferior part of the left breast and of course she continues to be concerned regarding this small mass laterally along the left breast edge.  Aside from that a detailed review of systems today was stable   COVID 19 VACCINATION STATUS: fully vaccinated [employer]), with booster 03/2020   HISTORY OF CURRENT ILLNESS: From the original intake note:  "Allison Hanna" presented with a palpable left breast mass. She initially experienced soreness to her left breast around April 2020 and tried to get a mammogram at this point, but due to pandemic concerns, the scan was delayed.  She did not actually feel a mass in the breast until June 1.  She underwent bilateral diagnostic mammography with tomography and left breast ultrasonography at [facility] on 02/11/2019 showing: breast density category B; irregular hyperechoic mass in the left breast at 4 o'clock 5 cm from the nipple measuring 8 mm; no enlarged adenopathy in the left axilla.  Accordingly on 02/12/2019 she proceeded to biopsy of the left breast area in question. The pathology from this procedure ([id number]) showed: invasive mammary carcinoma, e-cadherin positive. Prognostic indicators significant for: estrogen receptor, 100% positive with strong staining intensity and progesterone receptor, 0% negative. Proliferation marker Ki67 at 25%. HER2 positive by immunohistochemistry, (3+).  The patient's subsequent history is as detailed below.   PAST MEDICAL HISTORY: Past Medical History:  Diagnosis Date   Anxiety    Family history of breast cancer    Family history of melanoma    High cholesterol    Hypothyroid    Paralyzed vocal cords 2014   Personal history of chemotherapy  Personal history of radiation therapy    Raynauds syndrome    cold weather triggers fingers to turn white  History of palpitations (resolved)   PAST  SURGICAL HISTORY: Past Surgical History:  Procedure Laterality Date   BREAST LUMPECTOMY Left 02/2019   BREAST LUMPECTOMY WITH RADIOACTIVE SEED AND SENTINEL LYMPH NODE BIOPSY Left 03/05/2019   Procedure: LEFT BREAST LUMPECTOMY WITH RADIOACTIVE SEED AND LEFT AXILLARY DEEP SENTINEL LYMPH NODE BIOPSY INJECT BLUE DYE LEFT BREAST;  Surgeon: Fanny Skates, MD;  Location: [facility];  Service: General;  Laterality: Left;   KNEE SURGERY Bilateral    arthroscopy for menicus tears   PORTACATH PLACEMENT Right 03/05/2019   Procedure: INSERTION PORT-A-CATH;  Surgeon: Fanny Skates, MD;  Location: [facility];  Service: General;  Laterality: Right;   WRIST SURGERY      FAMILY HISTORY: Family History  Problem Relation Age of Onset   Breast cancer Mother 35   Cervical cancer Mother    Heart attack Father    Skin cancer Brother    Breast cancer Paternal Aunt        dx over 7s   Breast cancer Paternal Aunt        dx over 63   Uterine cancer Paternal Aunt 44   Cervical cancer Maternal Aunt    Liver cancer Paternal Uncle 56   Stroke Maternal Grandmother    Parkinson's disease Paternal Grandfather    Breast cancer Cousin 51       mother's maternal first cousin   Melanoma Cousin 45       pat first cousin  Patient's father was 45 years old when he died from heart attack. Patient's mother died at age 3. She was diagnosed with breast cancer at age 22. Two paternal aunts were diagnosed with breast cancer at older ages, one of which also had uterine cancer. She has 1 brother. He has a history of skin cancer.    GYNECOLOGIC HISTORY:  No LMP recorded. Patient is postmenopausal. Menarche: 60 years old Age at first live birth: 60 years old [place] P 3 LMP 2007 Contraceptive: yes, pill for about 8 years, no complications HRT no  Hysterectomy? no BSO? 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 She is married. [name] is in charge of operations for [employer]. She lives at home with her husband. [name], age 17, lives in [place], [place] as a Research scientist (life sciences). [name], age 29, lives in [place] as a Careers adviser. [name], age 60, lives in [place] as a Ship broker and works part-time in [employer]. She has no grandchildren.     ADVANCED DIRECTIVES: [name] is automatically her HCPOA.   HEALTH MAINTENANCE: Social History   Tobacco Use   Smoking status: Never   Smokeless tobacco: Never  Substance Use Topics   Alcohol use: Yes    Alcohol/week: 2.0 standard drinks    Types: 2 Standard drinks or equivalent per week   Drug use: Never     Colonoscopy: 2010  PAP: 08/2018  Bone density: 2019, osteopenia   No Known Allergies  Current Outpatient Medications  Medication Sig Dispense Refill   tamoxifen (NOLVADEX) 20 MG tablet Take 1 tablet (20 mg total) by mouth daily. 90 tablet 4   B Complex-Folic Acid (SUPER B COMPLEX MAXI) TABS Take by mouth.     bimatoprost (LATISSE) 0.03 % ophthalmic solution SMARTSIG:1 Drop(s)  Right Eye As Directed     calcium carbonate (OS-CAL) 600 MG TABS tablet Take 1,200 mg by mouth 2 (two) times daily with a meal.     Cholecalciferol (VITAMIN D3) 25 MCG (1000 UT) CAPS Take 2,000 Units by mouth daily.     estradiol (ESTRACE VAGINAL) 0.1 MG/GM vaginal cream Place 1 Applicatorful vaginally 3 (three) times a week. 42.5 g 12   Levothyroxine Sodium (SYNTHROID PO) Take 75 mcg by mouth daily.      Magnesium 250 MG TABS Take 250 mg by mouth daily.     rosuvastatin (CRESTOR) 5 MG tablet TAKE 1 TABLET BY MOUTH EVERY DAY 90 tablet 4   tretinoin (RETIN-A) 9.67 % cream APPLICATIONS APPLY A PEA SIZE TO FACE NIGHTLY AS DIRECTED     vitamin C (ASCORBIC ACID) 500 MG tablet Take 500 mg by mouth daily.     No current facility-administered medications for this visit.   Facility-Administered Medications Ordered in Other Visits   Medication Dose Route Frequency Provider Last Rate Last Admin   sodium chloride flush (NS) 0.9 % injection 10 mL  10 mL Intracatheter PRN Ashlay Altieri, Virgie Dad, MD        OBJECTIVE: White woman in no acute distress  Vitals:   02/08/21 1308  BP: 136/70  Pulse: (!) 58  Resp: 18  Temp: (!) 97.4 F (36.3 C)  SpO2: 99%   Wt Readings from Last 3 Encounters:  02/08/21 159 lb 1.6 oz (72.2 kg)  10/24/20 163 lb 12.8 oz (74.3 kg)  07/25/20 162 lb 11.2 oz (73.8 kg)   Body mass index is 24.92 kg/m.    ECOG FS:1 - Symptomatic but completely ambulatory  Sclerae unicteric, EOMs intact Wearing a mask No cervical or supraclavicular adenopathy Lungs no rales or rhonchi Heart regular rate and rhythm Abd soft, nontender, positive bowel sounds MSK no focal spinal tenderness, no upper extremity lymphedema Neuro: nonfocal, well oriented, appropriate affect Breasts: The right breast is benign.  The left breast is status postlumpectomy and radiation.  I do not palpate any mass in the breast itself.  In the lateral edge of the breast there is an easily palpable 1-1/2 cm slightly movable mass which appears unchanged from baseline   LAB RESULTS:  CMP     Component Value Date/Time   NA 132 (L) 10/24/2020 1142   K 4.5 10/24/2020 1142   CL 99 10/24/2020 1142   CO2 26 10/24/2020 1142   GLUCOSE 101 (H) 10/24/2020 1142   BUN 13 10/24/2020 1142   CREATININE 0.80 10/24/2020 1142   CREATININE 0.81 05/12/2019 1145   CALCIUM 9.1 10/24/2020 1142   PROT 7.2 10/24/2020 1142   ALBUMIN 4.0 10/24/2020 1142   AST 19 10/24/2020 1142   AST 19 05/12/2019 1145   ALT 16 10/24/2020 1142   ALT 16 05/12/2019 1145   ALKPHOS 33 (L) 10/24/2020 1142   BILITOT 0.2 (L) 10/24/2020 1142   BILITOT 0.3 05/12/2019 1145   GFRNONAA >60 10/24/2020 1142   GFRNONAA >60 05/12/2019 1145   GFRAA >60 04/12/2020 1140   GFRAA >60 05/12/2019 1145    No results found for: TOTALPROTELP, ALBUMINELP, A1GS, A2GS, BETS, BETA2SER, GAMS,  MSPIKE, SPEI  No results found for: KPAFRELGTCHN, LAMBDASER, KAPLAMBRATIO  Lab Results  Component Value Date   WBC 4.8 02/08/2021   NEUTROABS 3.2 02/08/2021   HGB 11.6 (L) 02/08/2021   HCT 34.5 (L) 02/08/2021   MCV 92.7 02/08/2021   PLT 241 02/08/2021    No results found  for: LABCA2  No components found for: BJSEGB151  No results for input(s): INR in the last 168 hours.  No results found for: LABCA2  No results found for: VOH607  No results found for: PXT062  No results found for: IRS854  No results found for: CA2729  No components found for: HGQUANT  No results found for: CEA1 / No results found for: CEA1   No results found for: AFPTUMOR  No results found for: CHROMOGRNA  No results found for: HGBA, HGBA2QUANT, HGBFQUANT, HGBSQUAN (Hemoglobinopathy evaluation)   No results found for: LDH  No results found for: IRON, TIBC, IRONPCTSAT (Iron and TIBC)  No results found for: FERRITIN  Urinalysis No results found for: COLORURINE, APPEARANCEUR, LABSPEC, PHURINE, GLUCOSEU, HGBUR, BILIRUBINUR, KETONESUR, PROTEINUR, UROBILINOGEN, NITRITE, LEUKOCYTESUR   STUDIES: No results found.   ELIGIBLE FOR AVAILABLE RESEARCH PROTOCOL: no  ASSESSMENT: 60 y.o. [place] woman status post left breast biopsy 02/12/2019 for a clinical T1b N0, stage IA invasive ductal carcinoma, estrogen receptor positive, progesterone receptor negative, HER-2 amplified, with an MIB-1-1 of 25%  (1) status post left lumpectomy and sentinel lymph node sampling 03/05/2019 for a pT1c pN0, stage IA invasive ductal carcinoma, grade 2, with positive lymphovascular invasion but negative margins  (a) a total of 8 lymph nodes removed (4 sentinel)   (2) adjuvant chemotherapy with paclitaxel and trastuzumab weekly x12 started 04/07/2019  (a) paclitaxel stopped after 4 doses because of peripheral neuropathy  (b) cyclophosphamide, methotrexate, fluorouracil started 05/12/2019, to be repeated q. 21 days x  3  (3) trastuzumab continued to complete 1 year (through August 2021)  (a) echo 02/26/2019 shows an ejection fraction in the 60-65% range  (b) echo 05/27/2019 shows an EF of 55-60%  (c) echo 08/07/2019 shows an ejection fraction in the 60-65% range  (d) echo 11/19/2019 shows an ejection fraction in the 60-65% range  (e) echo 03/03/2020 with an ejection fraction in the 55-60% range   (4) adjuvant radiation 07/15/2019 - 08/13/2019  (a) left breast / 40.05 Gy in 15 fractions  (b) boost / 12 Gy in 6 fractions  (5) tamoxifen started 10/17/2019  (6) persistent left lateral breast mass  (a) ultrasound-guided biopsy 05/24/2020 shows benign lymph node tissue  (b) left mammography and ultrasonography 11/25/2020 shows small lymph node, stable  (6) genetics testing 02/25/2019 through the Multi-Gene Panel offered by [facility] found no deleterious mutations in AIP, ALK, APC, ATM, AXIN2,BAP1,  BARD1, BLM, BMPR1A, BRCA1, BRCA2, BRIP1, CASR, CDC73, CDH1, CDK4, CDKN1B, CDKN1C, CDKN2A (p14ARF), CDKN2A (p16INK4a), CEBPA, CHEK2, CTNNA1, DICER1, DIS3L2, EGFR (c.2369C>T, p.Thr790Met variant only), EPCAM (Deletion/duplication testing only), FH, FLCN, GATA2, GPC3, GREM1 (Promoter region deletion/duplication testing only), HOXB13 (c.251G>A, p.Gly84Glu), HRAS, KIT, MAX, MEN1, MET, MITF (c.952G>A, p.Glu318Lys variant only), MLH1, MSH2, MSH3, MSH6, MUTYH, NBN, NF1, NF2, NTHL1, PALB2, PDGFRA, PHOX2B, PMS2, POLD1, POLE, POT1, PRKAR1A, PTCH1, PTEN, RAD50, RAD51C, RAD51D, RB1, RECQL4, RET, RNF43, RUNX1, SDHAF2, SDHA (sequence changes only), SDHB, SDHC, SDHD, SMAD4, SMARCA4, SMARCB1, SMARCE1, STK11, SUFU, TERC, TERT, TMEM127, TP53, TSC1, TSC2, VHL, WRN and WT1.     PLAN: Allison Hanna is close to 2 years out from definitive surgery for her breast cancer with no evidence of disease recurrence.  This is favorable.  She is tolerating tamoxifen well and the plan is to continue that a minimum of 5 years.  She is using the Estrace cream  appropriately and successfully, with good control of local atrophy symptoms.  The left lateral breast lymph node came on shortly after COVID injection.  It is  essentially unchanged by my exam.  It has been biopsied September 2021 and repeated ultrasound April 2022 showed it to be stable.  She is already scheduled for repeat mammography July of this year and barring any changes we will likely start yearly mammograms from that point  She will see me again in November.  She knows to call for any other issue that may develop before then  Total encounter time 25 minutes.Sarajane Jews C. Kiauna Zywicki, MD 02/08/21 1:34 PM Medical Oncology and Hematology [facility] [address], [place] [address] Tel. [phone]    Fax. [phone]   I, Wilburn Mylar, am acting as scribe for Dr. Virgie Dad. Allison Hanna.  I, [name] MD, have reviewed the above documentation for accuracy and completeness, and I agree with the above.   *Total Encounter Time as defined by the Centers for Medicare and Medicaid Services includes, in addition to the face-to-face time of a patient visit (documented in the note above) non-face-to-face time: obtaining and reviewing outside history, ordering and reviewing medications, tests or procedures, care coordination (communications with other health care professionals or caregivers) and documentation in the medical record.

## 2021-02-08 ENCOUNTER — Other Ambulatory Visit: Payer: Self-pay

## 2021-02-08 ENCOUNTER — Inpatient Hospital Stay: Payer: Managed Care, Other (non HMO)

## 2021-02-08 ENCOUNTER — Inpatient Hospital Stay: Payer: Managed Care, Other (non HMO) | Attending: Oncology | Admitting: Oncology

## 2021-02-08 VITALS — BP 136/70 | HR 58 | Temp 97.4°F | Resp 18 | Ht 67.0 in | Wt 159.1 lb

## 2021-02-08 DIAGNOSIS — Z823 Family history of stroke: Secondary | ICD-10-CM | POA: Insufficient documentation

## 2021-02-08 DIAGNOSIS — Z9221 Personal history of antineoplastic chemotherapy: Secondary | ICD-10-CM | POA: Insufficient documentation

## 2021-02-08 DIAGNOSIS — G4700 Insomnia, unspecified: Secondary | ICD-10-CM | POA: Insufficient documentation

## 2021-02-08 DIAGNOSIS — Z8269 Family history of other diseases of the musculoskeletal system and connective tissue: Secondary | ICD-10-CM | POA: Insufficient documentation

## 2021-02-08 DIAGNOSIS — Z79899 Other long term (current) drug therapy: Secondary | ICD-10-CM | POA: Diagnosis not present

## 2021-02-08 DIAGNOSIS — Z8249 Family history of ischemic heart disease and other diseases of the circulatory system: Secondary | ICD-10-CM | POA: Insufficient documentation

## 2021-02-08 DIAGNOSIS — Z803 Family history of malignant neoplasm of breast: Secondary | ICD-10-CM | POA: Diagnosis not present

## 2021-02-08 DIAGNOSIS — Z79810 Long term (current) use of selective estrogen receptor modulators (SERMs): Secondary | ICD-10-CM | POA: Insufficient documentation

## 2021-02-08 DIAGNOSIS — Z170 Estrogen receptor positive status [ER+]: Secondary | ICD-10-CM

## 2021-02-08 DIAGNOSIS — C50512 Malignant neoplasm of lower-outer quadrant of left female breast: Secondary | ICD-10-CM | POA: Diagnosis present

## 2021-02-08 DIAGNOSIS — Z923 Personal history of irradiation: Secondary | ICD-10-CM | POA: Diagnosis not present

## 2021-02-08 DIAGNOSIS — G629 Polyneuropathy, unspecified: Secondary | ICD-10-CM | POA: Diagnosis not present

## 2021-02-08 DIAGNOSIS — Z8049 Family history of malignant neoplasm of other genital organs: Secondary | ICD-10-CM | POA: Insufficient documentation

## 2021-02-08 DIAGNOSIS — Z808 Family history of malignant neoplasm of other organs or systems: Secondary | ICD-10-CM | POA: Insufficient documentation

## 2021-02-08 LAB — CBC WITH DIFFERENTIAL/PLATELET
Abs Immature Granulocytes: 0.01 10*3/uL (ref 0.00–0.07)
Basophils Absolute: 0.1 10*3/uL (ref 0.0–0.1)
Basophils Relative: 2 %
Eosinophils Absolute: 0.1 10*3/uL (ref 0.0–0.5)
Eosinophils Relative: 2 %
HCT: 34.5 % — ABNORMAL LOW (ref 36.0–46.0)
Hemoglobin: 11.6 g/dL — ABNORMAL LOW (ref 12.0–15.0)
Immature Granulocytes: 0 %
Lymphocytes Relative: 21 %
Lymphs Abs: 1 10*3/uL (ref 0.7–4.0)
MCH: 31.2 pg (ref 26.0–34.0)
MCHC: 33.6 g/dL (ref 30.0–36.0)
MCV: 92.7 fL (ref 80.0–100.0)
Monocytes Absolute: 0.5 10*3/uL (ref 0.1–1.0)
Monocytes Relative: 9 %
Neutro Abs: 3.2 10*3/uL (ref 1.7–7.7)
Neutrophils Relative %: 66 %
Platelets: 241 10*3/uL (ref 150–400)
RBC: 3.72 MIL/uL — ABNORMAL LOW (ref 3.87–5.11)
RDW: 12.6 % (ref 11.5–15.5)
WBC: 4.8 10*3/uL (ref 4.0–10.5)
nRBC: 0 % (ref 0.0–0.2)

## 2021-02-08 LAB — COMPREHENSIVE METABOLIC PANEL
ALT: 17 U/L (ref 0–44)
AST: 18 U/L (ref 15–41)
Albumin: 3.7 g/dL (ref 3.5–5.0)
Alkaline Phosphatase: 35 U/L — ABNORMAL LOW (ref 38–126)
Anion gap: 7 (ref 5–15)
BUN: 20 mg/dL (ref 8–23)
CO2: 27 mmol/L (ref 22–32)
Calcium: 9.1 mg/dL (ref 8.9–10.3)
Chloride: 100 mmol/L (ref 98–111)
Creatinine, Ser: 0.76 mg/dL (ref 0.44–1.00)
GFR, Estimated: >60 mL/min (ref >60)
Glucose, Bld: 95 mg/dL (ref 70–99)
Potassium: 4.5 mmol/L (ref 3.5–5.1)
Sodium: 134 mmol/L — ABNORMAL LOW (ref 135–145)
Total Bilirubin: 0.2 mg/dL — ABNORMAL LOW (ref 0.3–1.2)
Total Protein: 6.8 g/dL (ref 6.5–8.1)

## 2021-02-08 MED ORDER — TAMOXIFEN CITRATE 20 MG PO TABS: 20.0000 mg | ORAL_TABLET | Freq: Every day | ORAL | 4 refills | Status: AC

## 2021-03-03 ENCOUNTER — Other Ambulatory Visit: Payer: Self-pay

## 2021-03-03 ENCOUNTER — Ambulatory Visit
Admission: RE | Admit: 2021-03-03 | Discharge: 2021-03-03 | Disposition: A | Discharge status: Home / Self Care | Payer: Managed Care, Other (non HMO) | Source: Ambulatory Visit | Attending: Adult Health | Admitting: Adult Health

## 2021-03-03 DIAGNOSIS — N63 Unspecified lump in unspecified breast: Secondary | ICD-10-CM

## 2021-06-01 ENCOUNTER — Ambulatory Visit: Payer: Managed Care, Other (non HMO) | Admitting: Sports Medicine

## 2021-06-01 VITALS — Ht 67.0 in | Wt 158.0 lb

## 2021-06-01 DIAGNOSIS — S86111A Strain of other muscle(s) and tendon(s) of posterior muscle group at lower leg level, right leg, initial encounter: Secondary | ICD-10-CM | POA: Diagnosis not present

## 2021-06-01 NOTE — Progress Notes (Signed)
   Subjective:    Patient ID: Allison Hanna, female    DOB: Jan 06, 1961, 60 y.o.   MRN: [id number]  HPI chief complaint: Right calf pain  Levy presents today complaining of right calf pain that began while playing tennis 3 days ago.  She was running up to hit a drop shot when she felt a pop in her right calf.  Immediate pain.  She was unable to continue playing.  She had great difficulty ambulating afterwards.  She then developed swelling diffusely in her leg and calf shortly thereafter.  She denies any previous trauma to the right calf but suffered a rather significant left calf tear several years ago.  She localizes her pain to the distal medial gastroc.  Some pain near the musculotendinous junction as well.    Review of Systems As above    Objective:   Physical Exam  Well-developed, well-nourished.  No acute distress  Right calf: There is tenderness to palpation along the distal medial gastrocnemius.  No palpable defect.  Moderate swelling diffusely.  No ecchymosis.  Strength testing is limited by pain.  No palpable defect in the Achilles tendon.  Good pulses.  Ambulating with a noticeable limp.  Limited MSK ultrasound of the right calf shows an area of hypoechoic change at the interface of the distal medial gastrocnemius and soleus consistent with a tear.  There is surrounding hematoma as well.      Assessment & Plan:   Right calf pain secondary to gastrocnemius tear  I encouraged compression with a body helix compression sleeve during the day.  I have also encouraged rest, ice, and elevation is much as possible.  I given her a 5/16 inch heel lift to wear in her shoes and she will ambulate with the assistance of crutches until pain-free (she has crutches at home).  Follow-up with me again in 1 week for reevaluation.  We will plan on repeating her ultrasound at that visit.  I did go ahead and provide her with the initial rehab from up-to-date for severe calf injuries.  She will  call with questions or concerns prior to her follow-up visit.  This note was dictated using Dragon naturally speaking software and may contain errors in syntax, spelling, or content which have not been identified prior to signing this note.

## 2021-06-08 ENCOUNTER — Ambulatory Visit: Payer: Managed Care, Other (non HMO) | Admitting: Sports Medicine

## 2021-06-08 VITALS — BP 132/78 | Ht 67.0 in | Wt 157.0 lb

## 2021-06-08 DIAGNOSIS — S86111D Strain of other muscle(s) and tendon(s) of posterior muscle group at lower leg level, right leg, subsequent encounter: Secondary | ICD-10-CM

## 2021-06-08 NOTE — Progress Notes (Signed)
   Subjective:    Patient ID: Allison Hanna, female    DOB: [birth year], 60 y.o.   MRN: [id number]  HPI  Allison Hanna presents today for follow-up on a tear of the right calf.  Her symptoms have improved over the past week.  She finds her heel lift to be comfortable.  She has also been wearing her compression sleeve.  She is able to walk comfortably now than a week ago.  Her swelling has subsided.    Review of Systems As above    Objective:   Physical Exam  Well-developed, well-nourished.  No acute distress  Right calf: There is still tenderness to palpation along the medial head of the gastrocnemius distally but the previous soft tissue swelling has resolved.  She has good strength distally.  She is able to perform 10 calf raises without pain.  Good pulses.  Ambulating with a slight limp.  MSK ultrasound of the right calf once again shows hypoechoic change at the interface of the distal gastrocnemius and soleus consistent with a small tear.  I do not see a significant hematoma.      Assessment & Plan:   1 week status post right calf tear  Continue with compression and heel lifts.  She may start some easy walking using pain as her guide.  I did provide her with the [employer] heel drop exercises she will start doing those as her symptoms allow.  I still think she is several weeks out from returning to tennis.  She will follow-up with me again in 3 weeks for reevaluation.  Call with questions or concerns in the interim.

## 2021-06-19 ENCOUNTER — Telehealth: Payer: Self-pay | Admitting: Oncology

## 2021-06-19 NOTE — Telephone Encounter (Signed)
Rescheduled per 10/24 in basket, message left with pt

## 2021-06-29 ENCOUNTER — Other Ambulatory Visit: Payer: Managed Care, Other (non HMO)

## 2021-06-29 ENCOUNTER — Ambulatory Visit: Payer: Managed Care, Other (non HMO) | Admitting: Oncology

## 2021-06-29 ENCOUNTER — Ambulatory Visit: Payer: Managed Care, Other (non HMO) | Admitting: Sports Medicine

## 2021-07-04 ENCOUNTER — Ambulatory Visit (INDEPENDENT_AMBULATORY_CARE_PROVIDER_SITE_OTHER): Payer: Managed Care, Other (non HMO) | Admitting: Sports Medicine

## 2021-07-04 VITALS — BP 116/82 | Ht 67.0 in | Wt 158.0 lb

## 2021-07-04 DIAGNOSIS — S86111D Strain of other muscle(s) and tendon(s) of posterior muscle group at lower leg level, right leg, subsequent encounter: Secondary | ICD-10-CM | POA: Diagnosis not present

## 2021-07-04 NOTE — Progress Notes (Addendum)
    SUBJECTIVE:   CHIEF COMPLAINT / HPI:   Gastrocnemius tear, subsequent encounter: 60 year old female presenting for follow-up for right gastrocnemius tear.  This injury initially occurred when playing tennis and feeling a pop in the right calf.  Limited ultrasound performed on 10/6 showed area of hypoechoic change at the interface of the distal medial gastrocnemius and soleus consistent with a tear. She was previously evaluated on 10/6 and 10/13.  At last appointment she was recommended to continue with compression and heel lifts and that she can resume walking as pain allows.  Today she states her pain has improved.  She states that since last appointment she has started resuming her walking regimen including 1.5 miles walk daily.  She only gets a bit of pain when ambulating on uneven surface but otherwise has no discomfort.  She states other day-to-day tasks such as going up and down stairs do not cause her pain.  She can elicit a little bit of pain on palpation in her calf but is not affecting her greatly.  Her primary question/concern is when she can return to playing tennis.  She states she continues to perform her rehab exercises and use her heel lifts.   OBJECTIVE:   BP 116/82   Ht 5\' 7"  (1.702 m)   Wt 158 lb (71.7 kg)   BMI 24.75 kg/m    General: NAD, pleasant, able to participate in exam MSK: Right calf: No visual swelling, erythema.  Minor discomfort with palpation at the distal aspect of the medial calf.  Normal range of motion in all planes with right ankle.  Strength 5/5 in dorsiflexion/plantarflexion  ASSESSMENT/PLAN:   Gastrocnemius tear, subsequent encounter: Pain is continued to improve.  Patient is now able to ambulate 1.5 miles daily as a part of her preinjury regimen.  Still has some pain with palpation of the medial aspect of the distal gastroc.  She continues to perform her rehabilitative exercises and continue with her heel lifts.  Recommend that she give another 4  to 8 weeks before returning to tennis and that this be extended to the long end of the spectrum if she still has pain on palpation or pain with ambulating on on even surfaces.  Follow-up as needed.  Allison Hanna, Allison Hanna    Patient seen and evaluated with the resident.  I agree with the above plan of care.  Quick point-of-care ultrasound still shows a small area of hypoechoic change at the interface of the distal medial gastrocnemius and soleus.  However, this is much improved when compared to previous scans.  More importantly, Allison Hanna is doing very well clinically.  I still think she needs to wait another 1 to 2 months before resuming tennis.  In the meantime, I have encouraged her to continue with her Alfredson heel drop exercises and she may increase other forms of exercise as tolerated.  When she resumes tennis, she will use her compression sleeve.  At this point we will leave things open-ended for her to follow-up as needed.

## 2021-07-04 NOTE — Patient Instructions (Addendum)
Today we discussed your calf tear.  This seems to be improving well.  Recommend that you continue with your exercises and your heel lifts in your shoes.  After 4-8 more weeks if you are not having pain I think it would be reasonable to restart tennis and ease into it.  You can follow-up as needed.

## 2021-08-13 ENCOUNTER — Other Ambulatory Visit: Payer: Self-pay | Admitting: Oncology

## 2021-08-14 NOTE — Progress Notes (Signed)
[place]  [phone] [phone]    ID: Allison Hanna DOB: [birth year]  MR#: [id number]  GYF#:[id number]  Patient Care Team: Merrilee Seashore, MD as PCP - General (Internal Medicine) Mauro Kaufmann, RN as Oncology Nurse Navigator Rockwell Germany, RN as Oncology Nurse Navigator Hue Frick, Virgie Dad, MD as Consulting Physician (Oncology) Gery Pray, MD as Consulting Physician (Radiation Oncology) Fanny Skates, MD as Consulting Physician (General Surgery) Brien Few, MD as Consulting Physician (Obstetrics and Gynecology) Larey Dresser, MD as Consulting Physician (Cardiology) Feliberto Harts R (Inactive) Felipa Furnace, DPM as Consulting Physician (Podiatry) Rolm Bookbinder, MD as Consulting Physician (Dermatology) Thurman Coyer, DO as Consulting Physician (Sports Medicine) Chauncey Cruel, MD OTHER MD:  CHIEF COMPLAINT: Estrogen receptor positive breast cancer  CURRENT TREATMENT: tamoxifen   INTERVAL HISTORY: Nettye returns today for follow-up of her estrogen receptor positive breast cancer.  She continues on Tamoxifen.  She has occasional hot flashes which are not a major issue.  She continues to use the Estrace cream 2 times per week now and notes that her vaginal dryness and atrophy is much improved.  The stress urinary incontinence also has improved  Since her last visit, she underwent bilateral diagnostic mammography with tomography at [facility] on 03/03/21 showing: breast density category C; no evidence of malignancy in either breast.    REVIEW OF SYSTEMS: Lysa has palpated a different mass in her left breast.  Actually her breasts are quite difficult to examine as they are dense and lumpy.  There is a little bit of tenderness associated with this.  There has not been any left upper extremity edema.  She is exercising every morning and plans to get back into tennis when the weather improves.  She has many  questions regarding early detection and would like to learn more about the biology of breast cancer.  She would love to participate in a study if one became available.  A detailed review of systems today was otherwise stable   COVID 19 VACCINATION STATUS: fully vaccinated [employer]), with booster 03/2020 x2; also had COVID June 2022   HISTORY OF CURRENT ILLNESS: From the original intake note:  "Chiante" presented with a palpable left breast mass. She initially experienced soreness to her left breast around April 2020 and tried to get a mammogram at this point, but due to pandemic concerns, the scan was delayed.  She did not actually feel a mass in the breast until June 1.  She underwent bilateral diagnostic mammography with tomography and left breast ultrasonography at [facility] on 02/11/2019 showing: breast density category B; irregular hyperechoic mass in the left breast at 4 o'clock 5 cm from the nipple measuring 8 mm; no enlarged adenopathy in the left axilla.  Accordingly on 02/12/2019 she proceeded to biopsy of the left breast area in question. The pathology from this procedure ([id number]) showed: invasive mammary carcinoma, e-cadherin positive. Prognostic indicators significant for: estrogen receptor, 100% positive with strong staining intensity and progesterone receptor, 0% negative. Proliferation marker Ki67 at 25%. HER2 positive by immunohistochemistry, (3+).  The patient's subsequent history is as detailed below.   PAST MEDICAL HISTORY: Past Medical History:  Diagnosis Date   Anxiety    Family history of breast cancer    Family history of melanoma    High cholesterol    Hypothyroid    Paralyzed vocal cords 2014   Personal history of chemotherapy    Personal history of radiation therapy  Raynauds syndrome    cold weather triggers fingers to turn white  History of palpitations (resolved)   PAST SURGICAL HISTORY: Past Surgical History:  Procedure Laterality Date    BREAST LUMPECTOMY Left 02/2019   BREAST LUMPECTOMY WITH RADIOACTIVE SEED AND SENTINEL LYMPH NODE BIOPSY Left 03/05/2019   Procedure: LEFT BREAST LUMPECTOMY WITH RADIOACTIVE SEED AND LEFT AXILLARY DEEP SENTINEL LYMPH NODE BIOPSY INJECT BLUE DYE LEFT BREAST;  Surgeon: Fanny Skates, MD;  Location: [facility];  Service: General;  Laterality: Left;   KNEE SURGERY Bilateral    arthroscopy for menicus tears   PORTACATH PLACEMENT Right 03/05/2019   Procedure: INSERTION PORT-A-CATH;  Surgeon: Fanny Skates, MD;  Location: [facility];  Service: General;  Laterality: Right;   WRIST SURGERY      FAMILY HISTORY: Family History  Problem Relation Age of Onset   Breast cancer Mother 67   Cervical cancer Mother    Heart attack Father    Skin cancer Brother    Breast cancer Paternal Aunt        dx over 60s   Breast cancer Paternal Aunt        dx over 40   Uterine cancer Paternal Aunt 23   Cervical cancer Maternal Aunt    Liver cancer Paternal Uncle 35   Stroke Maternal Grandmother    Parkinson's disease Paternal Grandfather    Breast cancer Cousin 44       mother's maternal first cousin   Melanoma Cousin 39       pat first cousin  Patient's father was 74 years old when he died from heart attack. Patient's mother died at age 53. She was diagnosed with breast cancer at age 23. Two paternal aunts were diagnosed with breast cancer at older ages, one of which also had uterine cancer. She has 1 brother. He has a history of skin cancer.    GYNECOLOGIC HISTORY:  No LMP recorded. Patient is postmenopausal. Menarche: 60 years old Age at first live birth: 60 years old [place] P 3 LMP 2007 Contraceptive: yes, pill for about 8 years, no complications HRT no  Hysterectomy? no BSO? 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  She has a degree in biology.  She is married. [name] is in  charge of operations for [employer]. She lives at home with her husband. [name], age 58, lives in [place], [place] as a Research scientist (life sciences). [name], age 59, lives in [place] as a Careers adviser. [name], age 37, lives in [place] as a Ship broker and works part-time in [employer]. She has no grandchildren.     ADVANCED DIRECTIVES: [name] is automatically her HCPOA.   HEALTH MAINTENANCE: Social History   Tobacco Use   Smoking status: Never   Smokeless tobacco: Never  Substance Use Topics   Alcohol use: Yes    Alcohol/week: 2.0 standard drinks    Types: 2 Standard drinks or equivalent per week   Drug use: Never     Colonoscopy: 2010  PAP: 08/2018  Bone density: 2019, osteopenia   No Known Allergies  Current Outpatient Medications  Medication Sig Dispense Refill   B Complex-Folic Acid (SUPER B COMPLEX MAXI) TABS Take by mouth.     bimatoprost (LATISSE) 0.03 % ophthalmic solution SMARTSIG:1 Drop(s) Right Eye As Directed     calcium carbonate (OS-CAL) 600 MG TABS tablet Take 1,200 mg by  mouth 2 (two) times daily with a meal.     Cholecalciferol (VITAMIN D3) 25 MCG (1000 UT) CAPS Take 2,000 Units by mouth daily.     estradiol (ESTRACE VAGINAL) 0.1 MG/GM vaginal cream Place 1 Applicatorful vaginally 3 (three) times a week. 42.5 g 12   Levothyroxine Sodium (SYNTHROID PO) Take 75 mcg by mouth daily.      Magnesium 250 MG TABS Take 250 mg by mouth daily.     rosuvastatin (CRESTOR) 5 MG tablet TAKE 1 TABLET BY MOUTH EVERY DAY 90 tablet 4   tretinoin (RETIN-A) 5.44 % cream APPLICATIONS APPLY A PEA SIZE TO FACE NIGHTLY AS DIRECTED     vitamin C (ASCORBIC ACID) 500 MG tablet Take 500 mg by mouth daily.     No current facility-administered medications for this visit.   Facility-Administered Medications Ordered in Other Visits  Medication Dose Route Frequency Provider Last Rate Last Admin   sodium chloride flush (NS) 0.9 % injection 10 mL  10 mL Intracatheter PRN  Deniro Laymon, Virgie Dad, MD        OBJECTIVE: White woman who appears younger than stated age  60:   08/15/21 0956  BP: 133/73  Pulse: 73  Resp: 16  Temp: 97.6 F (36.4 C)  SpO2: 100%   Wt Readings from Last 3 Encounters:  08/15/21 160 lb 14.4 oz (73 kg)  07/04/21 158 lb (71.7 kg)  06/08/21 157 lb (71.2 kg)   Body mass index is 25.2 kg/m.    ECOG FS:1 - Symptomatic but completely ambulatory  Sclerae unicteric, EOMs intact Wearing a mask No cervical or supraclavicular adenopathy Lungs no rales or rhonchi Heart regular rate and rhythm Abd soft, nontender, positive bowel sounds MSK no focal spinal tenderness, no upper extremity lymphedema Neuro: nonfocal, well oriented, appropriate affect Breasts: Both breasts are lumpy.  The right breast is unremarkable.  In the left breast upper outer quadrant there is an area which is minimally tender, not erythematous, and slightly irregular.  This is different from the very lateral lymph node which has been previously evaluated.   LAB RESULTS:  CMP     Component Value Date/Time   NA 136 08/15/2021 0942   K 4.0 08/15/2021 0942   CL 101 08/15/2021 0942   CO2 29 08/15/2021 0942   GLUCOSE 89 08/15/2021 0942   BUN 25 (H) 08/15/2021 0942   CREATININE 0.79 08/15/2021 0942   CREATININE 0.81 05/12/2019 1145   CALCIUM 9.3 08/15/2021 0942   PROT 7.4 08/15/2021 0942   ALBUMIN 4.3 08/15/2021 0942   AST 19 08/15/2021 0942   AST 19 05/12/2019 1145   ALT 17 08/15/2021 0942   ALT 16 05/12/2019 1145   ALKPHOS 32 (L) 08/15/2021 0942   BILITOT 0.5 08/15/2021 0942   BILITOT 0.3 05/12/2019 1145   GFRNONAA >60 08/15/2021 0942   GFRNONAA >60 05/12/2019 1145   GFRAA >60 04/12/2020 1140   GFRAA >60 05/12/2019 1145    No results found for: TOTALPROTELP, ALBUMINELP, A1GS, A2GS, BETS, BETA2SER, GAMS, MSPIKE, SPEI  No results found for: KPAFRELGTCHN, LAMBDASER, KAPLAMBRATIO  Lab Results  Component Value Date   WBC 5.1 08/15/2021   NEUTROABS  3.8 08/15/2021   HGB 12.4 08/15/2021   HCT 37.4 08/15/2021   MCV 92.8 08/15/2021   PLT 258 08/15/2021    No results found for: LABCA2  No components found for: BEEFEO712  No results for input(s): INR in the last 168 hours.  No results found for: LABCA2  No results found  for: GUR427  No results found for: CWC376  No results found for: EGB151  No results found for: CA2729  No components found for: HGQUANT  No results found for: CEA1 / No results found for: CEA1   No results found for: AFPTUMOR  No results found for: CHROMOGRNA  No results found for: HGBA, HGBA2QUANT, HGBFQUANT, HGBSQUAN (Hemoglobinopathy evaluation)   No results found for: LDH  No results found for: IRON, TIBC, IRONPCTSAT (Iron and TIBC)  No results found for: FERRITIN  Urinalysis No results found for: COLORURINE, APPEARANCEUR, LABSPEC, PHURINE, GLUCOSEU, HGBUR, BILIRUBINUR, KETONESUR, PROTEINUR, UROBILINOGEN, NITRITE, LEUKOCYTESUR   STUDIES: No results found.   ELIGIBLE FOR AVAILABLE RESEARCH PROTOCOL: no  ASSESSMENT: 60 y.o. [place] woman status post left breast biopsy 02/12/2019 for a clinical T1b N0, stage IA invasive ductal carcinoma, estrogen receptor positive, progesterone receptor negative, HER-2 amplified, with an MIB-1-1 of 25%  (1) status post left lumpectomy and sentinel lymph node sampling 03/05/2019 for a pT1c pN0, stage IA invasive ductal carcinoma, grade 2, with positive lymphovascular invasion but negative margins  (a) a total of 8 lymph nodes removed (4 sentinel)   (2) adjuvant chemotherapy with paclitaxel and trastuzumab weekly x12 started 04/07/2019  (a) paclitaxel stopped after 4 doses because of peripheral neuropathy  (b) cyclophosphamide, methotrexate, fluorouracil started 05/12/2019, to be repeated q. 21 days x 3  (3) trastuzumab continued to complete 1 year (through August 2021)  (a) echo 02/26/2019 shows an ejection fraction in the 60-65% range  (b) echo  05/27/2019 shows an EF of 55-60%  (c) echo 08/07/2019 shows an ejection fraction in the 60-65% range  (d) echo 11/19/2019 shows an ejection fraction in the 60-65% range  (e) echo 03/03/2020 with an ejection fraction in the 55-60% range   (4) adjuvant radiation 07/15/2019 - 08/13/2019  (a) left breast / 40.05 Gy in 15 fractions  (b) boost / 12 Gy in 6 fractions  (5) tamoxifen started 10/17/2019  (6) persistent left lateral breast mass  (a) ultrasound-guided biopsy 05/24/2020 shows benign lymph node tissue  (b) left mammography and ultrasonography 11/25/2020 shows small lymph node, stable  (6) genetics testing 02/25/2019 through the Multi-Gene Panel offered by [facility] found no deleterious mutations in AIP, ALK, APC, ATM, AXIN2,BAP1,  BARD1, BLM, BMPR1A, BRCA1, BRCA2, BRIP1, CASR, CDC73, CDH1, CDK4, CDKN1B, CDKN1C, CDKN2A (p14ARF), CDKN2A (p16INK4a), CEBPA, CHEK2, CTNNA1, DICER1, DIS3L2, EGFR (c.2369C>T, p.Thr790Met variant only), EPCAM (Deletion/duplication testing only), FH, FLCN, GATA2, GPC3, GREM1 (Promoter region deletion/duplication testing only), HOXB13 (c.251G>A, p.Gly84Glu), HRAS, KIT, MAX, MEN1, MET, MITF (c.952G>A, p.Glu318Lys variant only), MLH1, MSH2, MSH3, MSH6, MUTYH, NBN, NF1, NF2, NTHL1, PALB2, PDGFRA, PHOX2B, PMS2, POLD1, POLE, POT1, PRKAR1A, PTCH1, PTEN, RAD50, RAD51C, RAD51D, RB1, RECQL4, RET, RNF43, RUNX1, SDHAF2, SDHA (sequence changes only), SDHB, SDHC, SDHD, SMAD4, SMARCA4, SMARCB1, SMARCE1, STK11, SUFU, TERC, TERT, TMEM127, TP53, TSC1, TSC2, VHL, WRN and WT1.     PLAN: Anslee is now 2-1/2 years out from definitive surgery for her breast cancer with no evidence of disease recurrence.  This is favorable.  She is tolerating tamoxifen well and the plan likely will be to continue it 10 years as she is also using Estrace cream which she would not be able to do if she were not on tamoxifen  She is concerned about the new change she has noted in the left breast.  I have set her  up for a left ultrasound and hopefully that will clear up that issue.  Otherwise we could consider bilateral breast MRI  She  would like to learn more about breast cancer and I gave her information on how to get into the advances in breast cancer side from the [place], and also the [facility] post and the [facility] informational page.  Assuming no suspicious findings from the ultrasound she will see [place] again in July after her routine yearly mammography  Total encounter time 35 minutes.Sarajane Jews C. Langley Flatley, MD 08/15/21 10:22 AM Medical Oncology and Hematology [facility] [place], [place] [address] Tel. [phone]    Fax. [phone]   I, Wilburn Mylar, am acting as scribe for Dr. Virgie Dad. Albana Saperstein.  I, [name] MD, have reviewed the above documentation for accuracy and completeness, and I agree with the above.   *Total Encounter Time as defined by the Centers for Medicare and Medicaid Services includes, in addition to the face-to-face time of a patient visit (documented in the note above) non-face-to-face time: obtaining and reviewing outside history, ordering and reviewing medications, tests or procedures, care coordination (communications with other health care professionals or caregivers) and documentation in the medical record.

## 2021-08-15 ENCOUNTER — Other Ambulatory Visit: Payer: Self-pay

## 2021-08-15 ENCOUNTER — Inpatient Hospital Stay: Payer: Managed Care, Other (non HMO) | Attending: Oncology

## 2021-08-15 ENCOUNTER — Inpatient Hospital Stay: Payer: Managed Care, Other (non HMO) | Admitting: Oncology

## 2021-08-15 VITALS — BP 133/73 | HR 73 | Temp 97.6°F | Resp 16 | Ht 67.0 in | Wt 160.9 lb

## 2021-08-15 DIAGNOSIS — C50512 Malignant neoplasm of lower-outer quadrant of left female breast: Secondary | ICD-10-CM | POA: Insufficient documentation

## 2021-08-15 DIAGNOSIS — Z79899 Other long term (current) drug therapy: Secondary | ICD-10-CM | POA: Diagnosis not present

## 2021-08-15 DIAGNOSIS — Z170 Estrogen receptor positive status [ER+]: Secondary | ICD-10-CM | POA: Diagnosis not present

## 2021-08-15 DIAGNOSIS — Z79810 Long term (current) use of selective estrogen receptor modulators (SERMs): Secondary | ICD-10-CM | POA: Insufficient documentation

## 2021-08-15 DIAGNOSIS — E039 Hypothyroidism, unspecified: Secondary | ICD-10-CM | POA: Insufficient documentation

## 2021-08-15 DIAGNOSIS — M8580 Other specified disorders of bone density and structure, unspecified site: Secondary | ICD-10-CM | POA: Insufficient documentation

## 2021-08-15 LAB — COMPREHENSIVE METABOLIC PANEL
ALT: 17 U/L (ref 0–44)
AST: 19 U/L (ref 15–41)
Albumin: 4.3 g/dL (ref 3.5–5.0)
Alkaline Phosphatase: 32 U/L — ABNORMAL LOW (ref 38–126)
Anion gap: 6 (ref 5–15)
BUN: 25 mg/dL — ABNORMAL HIGH (ref 8–23)
CO2: 29 mmol/L (ref 22–32)
Calcium: 9.3 mg/dL (ref 8.9–10.3)
Chloride: 101 mmol/L (ref 98–111)
Creatinine, Ser: 0.79 mg/dL (ref 0.44–1.00)
GFR, Estimated: >60 mL/min (ref >60)
Glucose, Bld: 89 mg/dL (ref 70–99)
Potassium: 4 mmol/L (ref 3.5–5.1)
Sodium: 136 mmol/L (ref 135–145)
Total Bilirubin: 0.5 mg/dL (ref 0.3–1.2)
Total Protein: 7.4 g/dL (ref 6.5–8.1)

## 2021-08-15 LAB — CBC WITH DIFFERENTIAL/PLATELET
Abs Immature Granulocytes: 0.04 10*3/uL (ref 0.00–0.07)
Basophils Absolute: 0.1 10*3/uL (ref 0.0–0.1)
Basophils Relative: 2 %
Eosinophils Absolute: 0.1 10*3/uL (ref 0.0–0.5)
Eosinophils Relative: 2 %
HCT: 37.4 % (ref 36.0–46.0)
Hemoglobin: 12.4 g/dL (ref 12.0–15.0)
Immature Granulocytes: 1 %
Lymphocytes Relative: 15 %
Lymphs Abs: 0.8 10*3/uL (ref 0.7–4.0)
MCH: 30.8 pg (ref 26.0–34.0)
MCHC: 33.2 g/dL (ref 30.0–36.0)
MCV: 92.8 fL (ref 80.0–100.0)
Monocytes Absolute: 0.4 10*3/uL (ref 0.1–1.0)
Monocytes Relative: 7 %
Neutro Abs: 3.8 10*3/uL (ref 1.7–7.7)
Neutrophils Relative %: 73 %
Platelets: 258 10*3/uL (ref 150–400)
RBC: 4.03 MIL/uL (ref 3.87–5.11)
RDW: 12.4 % (ref 11.5–15.5)
WBC: 5.1 10*3/uL (ref 4.0–10.5)
nRBC: 0 % (ref 0.0–0.2)

## 2021-08-15 MED ORDER — TAMOXIFEN CITRATE 20 MG PO TABS: 20.0000 mg | ORAL_TABLET | Freq: Every day | ORAL | 12 refills | Status: AC

## 2021-08-15 MED ORDER — ESTRADIOL 0.1 MG/GM VA CREA: 1.0000 | TOPICAL_CREAM | VAGINAL | 12 refills | Status: AC

## 2021-08-16 ENCOUNTER — Encounter: Payer: Self-pay | Admitting: Oncology

## 2021-08-22 ENCOUNTER — Telehealth: Payer: Self-pay | Admitting: Oncology

## 2021-08-22 NOTE — Telephone Encounter (Signed)
Scheduled appointment per 12/20 los. Left message.

## 2021-09-05 ENCOUNTER — Other Ambulatory Visit: Payer: Self-pay | Admitting: Oncology

## 2021-09-05 ENCOUNTER — Other Ambulatory Visit: Payer: Self-pay | Admitting: Hematology and Oncology

## 2021-09-05 DIAGNOSIS — N6320 Unspecified lump in the left breast, unspecified quadrant: Secondary | ICD-10-CM

## 2021-09-06 ENCOUNTER — Other Ambulatory Visit: Payer: Managed Care, Other (non HMO)

## 2021-09-08 ENCOUNTER — Ambulatory Visit
Admission: RE | Admit: 2021-09-08 | Discharge: 2021-09-08 | Disposition: A | Discharge status: Home / Self Care | Payer: Managed Care, Other (non HMO) | Source: Ambulatory Visit | Attending: Oncology | Admitting: Oncology

## 2021-09-08 ENCOUNTER — Ambulatory Visit
Admission: RE | Admit: 2021-09-08 | Discharge: 2021-09-08 | Disposition: A | Discharge status: Home / Self Care | Payer: Managed Care, Other (non HMO) | Source: Ambulatory Visit | Attending: Hematology and Oncology | Admitting: Hematology and Oncology

## 2021-09-08 DIAGNOSIS — N6320 Unspecified lump in the left breast, unspecified quadrant: Secondary | ICD-10-CM

## 2021-09-08 DIAGNOSIS — Z170 Estrogen receptor positive status [ER+]: Secondary | ICD-10-CM

## 2021-10-05 IMAGING — MG DIGITAL DIAGNOSTIC BILAT W/ TOMO W/ CAD
8 of 12 series · 9 of 32 positions shown · non-contrast
Comparison: Previous exam(s).

CLINICAL DATA: 59-year-old female status post malignant left
lumpectomy with chemotherapy and radiation in 6969. This is the
patient's first post lumpectomy mammogram. No current issues.

EXAM:
DIGITAL DIAGNOSTIC BILATERAL MAMMOGRAM WITH TOMO AND CAD

[L MLO (1 of 2)]
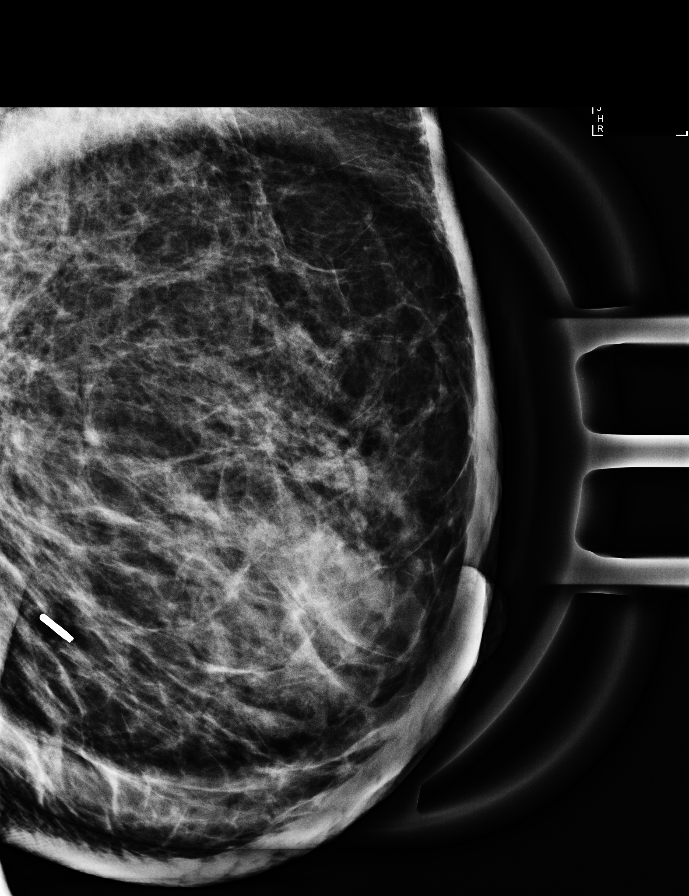

[L MLO (2 of 2)]
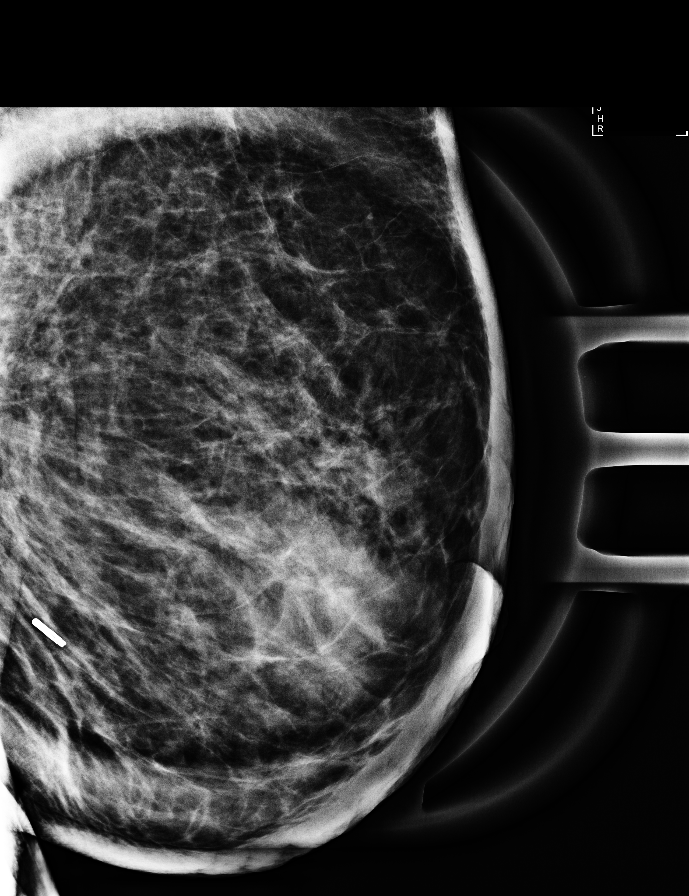

[L XCCL synth-2D]
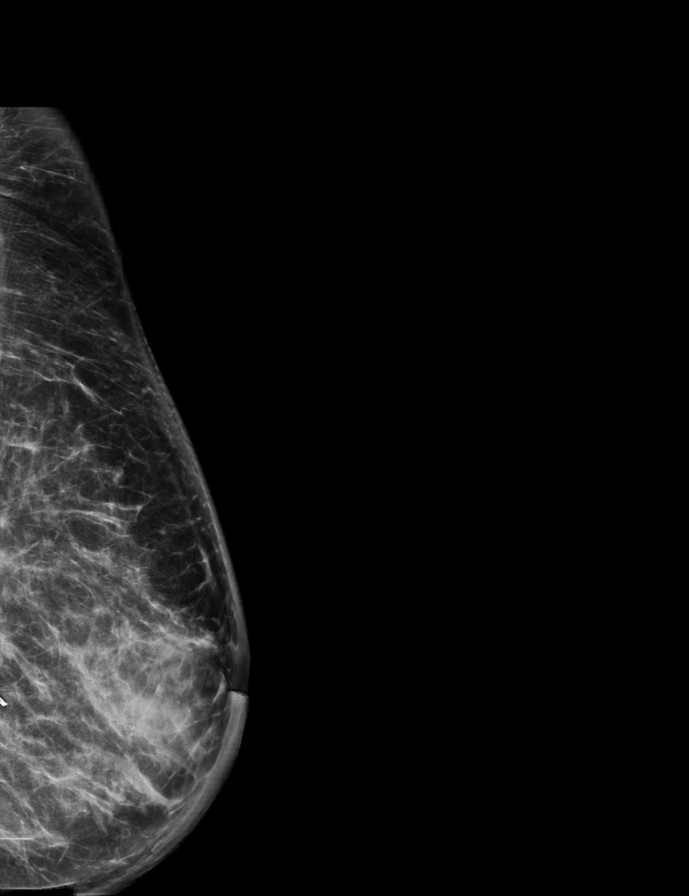

[R MLO synth-2D]
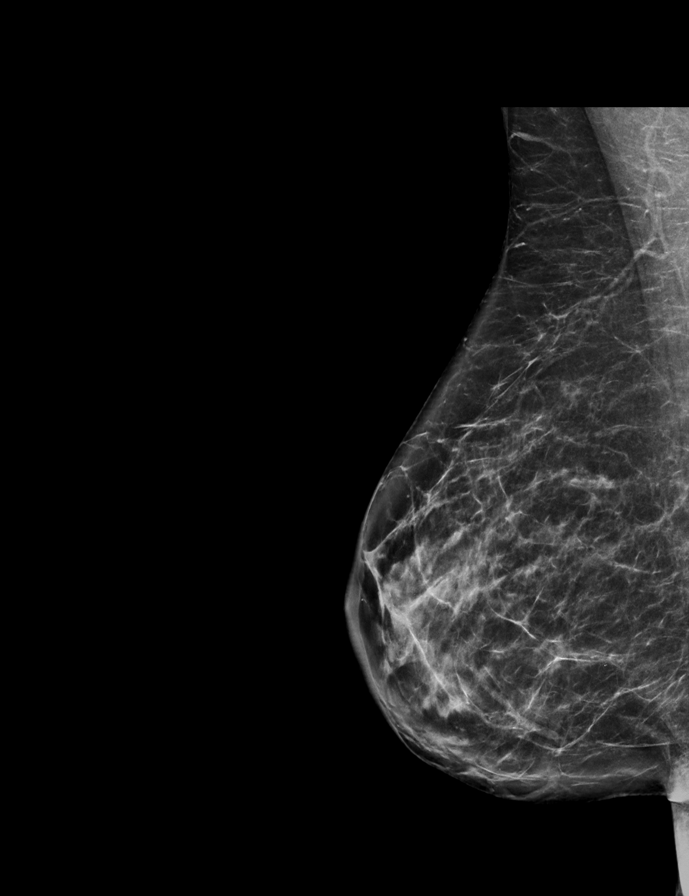

[L CC synth-2D]
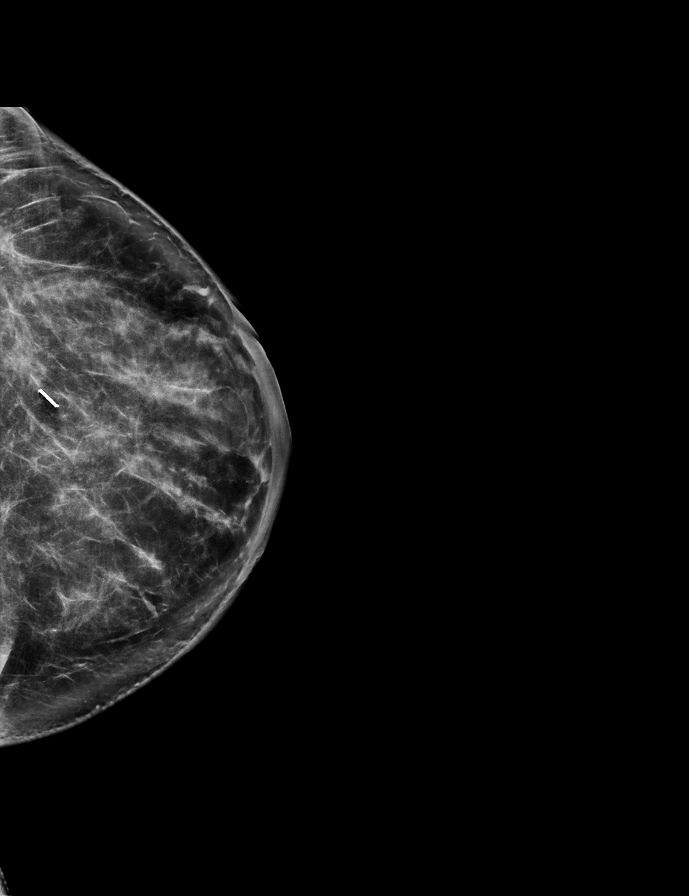

[R CC synth-2D]
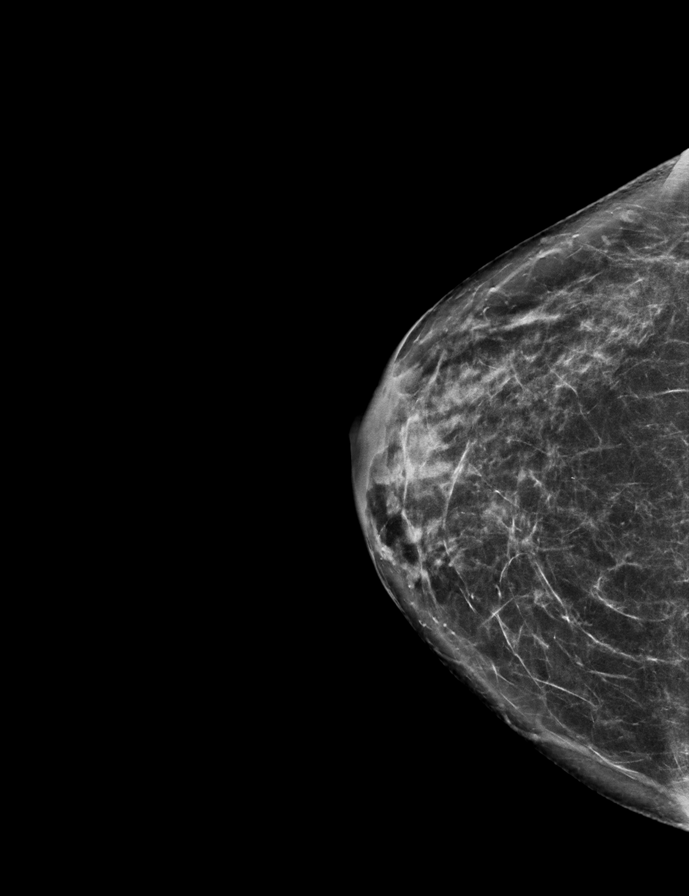

[L MLO synth-2D]
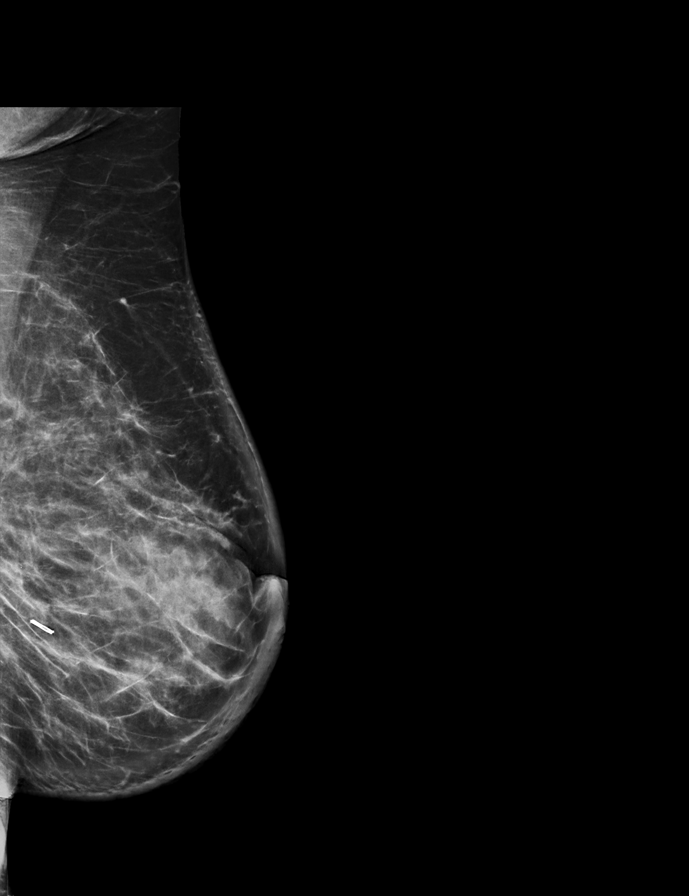

[L MLO tomo · 2 of 92 frames shown]
[frame 30/92]
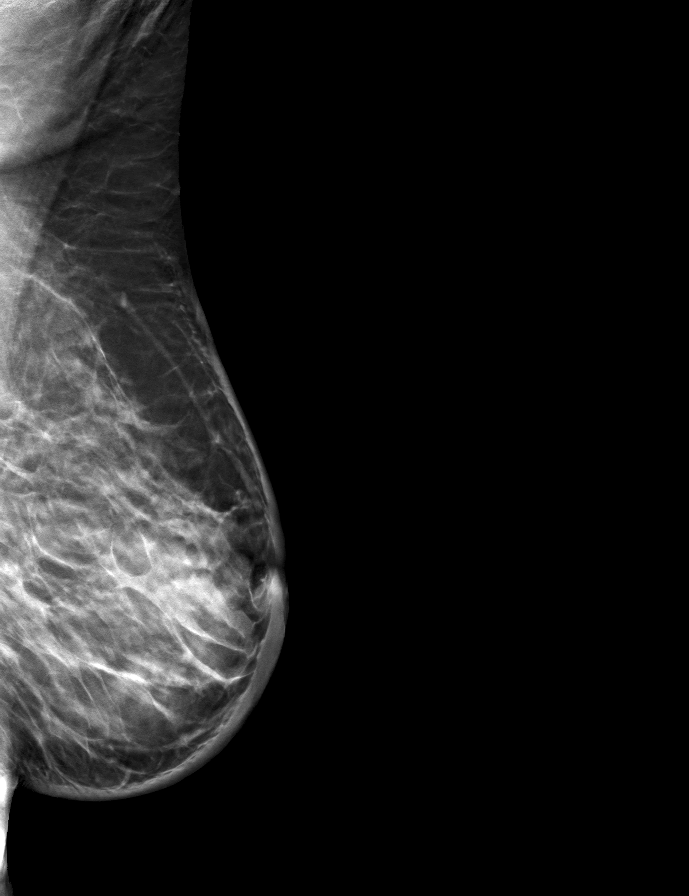
[frame 47/92]
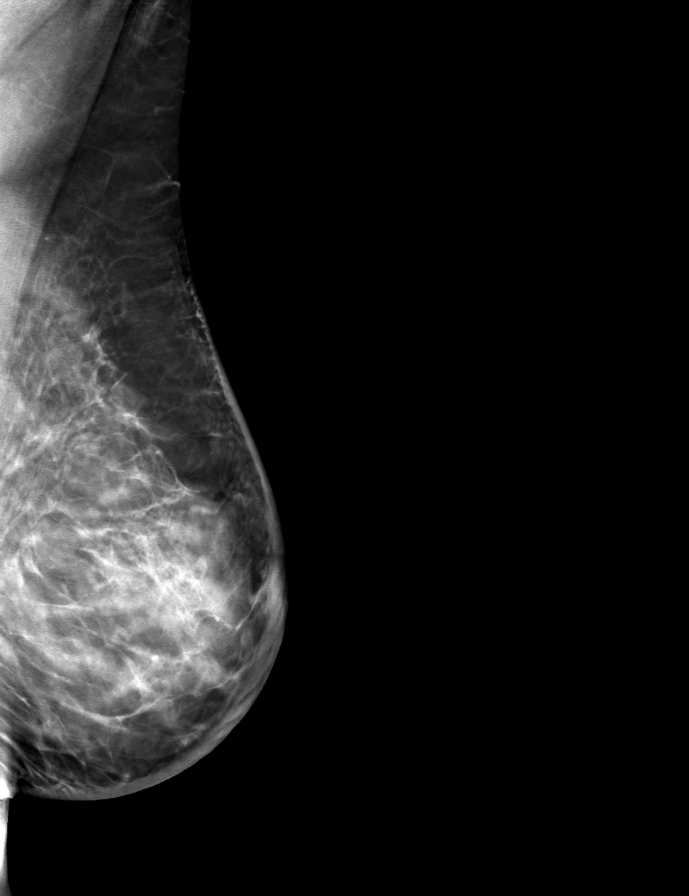

[9 of 32 positions shown; findings below may reference images not displayed]

ACR Breast Density Category c: The breast tissue is heterogeneously
dense, which may obscure small masses.
FINDINGS: Interval postsurgical changes are noted in the far posterior left
breast. Diffuse skin and trabecular thickening is consistent with
radiation related changes. No suspicious findings are identified in
the remainder of either breast.

Mammographic images were processed with CAD.
IMPRESSION: 1. No mammographic evidence of malignancy in either breast.
2. Interval posttreatment changes of the left breast.

RECOMMENDATION:
Diagnostic mammogram is suggested in 1 year. (Code:AE-P-7PT)

I have discussed the findings and recommendations with the patient.
If applicable, a reminder letter will be sent to the patient
regarding the next appointment.

BI-RADS CATEGORY  2: Benign.

## 2022-02-05 ENCOUNTER — Other Ambulatory Visit: Payer: Self-pay | Admitting: Hematology and Oncology

## 2022-02-05 DIAGNOSIS — Z9889 Other specified postprocedural states: Secondary | ICD-10-CM

## 2022-03-06 ENCOUNTER — Ambulatory Visit
Admission: RE | Admit: 2022-03-06 | Discharge: 2022-03-06 | Disposition: A | Discharge status: Home / Self Care | Payer: Managed Care, Other (non HMO) | Source: Ambulatory Visit | Attending: Hematology and Oncology | Admitting: Hematology and Oncology

## 2022-03-06 DIAGNOSIS — Z9889 Other specified postprocedural states: Secondary | ICD-10-CM

## 2022-03-19 ENCOUNTER — Other Ambulatory Visit: Payer: Self-pay | Admitting: *Deleted

## 2022-03-19 DIAGNOSIS — Z170 Estrogen receptor positive status [ER+]: Secondary | ICD-10-CM

## 2022-03-20 ENCOUNTER — Inpatient Hospital Stay (HOSPITAL_BASED_OUTPATIENT_CLINIC_OR_DEPARTMENT_OTHER): Payer: Managed Care, Other (non HMO) | Admitting: Hematology and Oncology

## 2022-03-20 ENCOUNTER — Other Ambulatory Visit: Payer: Self-pay

## 2022-03-20 ENCOUNTER — Encounter: Payer: Self-pay | Admitting: Hematology and Oncology

## 2022-03-20 ENCOUNTER — Inpatient Hospital Stay: Payer: Managed Care, Other (non HMO) | Attending: Hematology and Oncology

## 2022-03-20 VITALS — BP 152/87 | HR 67 | Temp 97.7°F | Resp 17 | Ht 67.0 in | Wt 162.0 lb

## 2022-03-20 DIAGNOSIS — Z79810 Long term (current) use of selective estrogen receptor modulators (SERMs): Secondary | ICD-10-CM | POA: Insufficient documentation

## 2022-03-20 DIAGNOSIS — C50512 Malignant neoplasm of lower-outer quadrant of left female breast: Secondary | ICD-10-CM

## 2022-03-20 DIAGNOSIS — Z170 Estrogen receptor positive status [ER+]: Secondary | ICD-10-CM

## 2022-03-20 LAB — CMP (CANCER CENTER ONLY)
ALT: 14 U/L (ref 0–44)
AST: 16 U/L (ref 15–41)
Albumin: 4.1 g/dL (ref 3.5–5.0)
Alkaline Phosphatase: 33 U/L — ABNORMAL LOW (ref 38–126)
Anion gap: 5 (ref 5–15)
BUN: 16 mg/dL (ref 8–23)
CO2: 28 mmol/L (ref 22–32)
Calcium: 8.8 mg/dL — ABNORMAL LOW (ref 8.9–10.3)
Chloride: 101 mmol/L (ref 98–111)
Creatinine: 0.94 mg/dL (ref 0.44–1.00)
GFR, Estimated: >60 mL/min (ref >60)
Glucose, Bld: 99 mg/dL (ref 70–99)
Potassium: 4.3 mmol/L (ref 3.5–5.1)
Sodium: 134 mmol/L — ABNORMAL LOW (ref 135–145)
Total Bilirubin: 0.3 mg/dL (ref 0.3–1.2)
Total Protein: 6.8 g/dL (ref 6.5–8.1)

## 2022-03-20 LAB — CBC WITH DIFFERENTIAL (CANCER CENTER ONLY)
Abs Immature Granulocytes: 0.02 10*3/uL (ref 0.00–0.07)
Basophils Absolute: 0.1 10*3/uL (ref 0.0–0.1)
Basophils Relative: 1 %
Eosinophils Absolute: 0.1 10*3/uL (ref 0.0–0.5)
Eosinophils Relative: 2 %
HCT: 35 % — ABNORMAL LOW (ref 36.0–46.0)
Hemoglobin: 11.9 g/dL — ABNORMAL LOW (ref 12.0–15.0)
Immature Granulocytes: 0 %
Lymphocytes Relative: 22 %
Lymphs Abs: 1.1 10*3/uL (ref 0.7–4.0)
MCH: 31.1 pg (ref 26.0–34.0)
MCHC: 34 g/dL (ref 30.0–36.0)
MCV: 91.4 fL (ref 80.0–100.0)
Monocytes Absolute: 0.4 10*3/uL (ref 0.1–1.0)
Monocytes Relative: 7 %
Neutro Abs: 3.4 10*3/uL (ref 1.7–7.7)
Neutrophils Relative %: 68 %
Platelet Count: 244 10*3/uL (ref 150–400)
RBC: 3.83 MIL/uL — ABNORMAL LOW (ref 3.87–5.11)
RDW: 12.5 % (ref 11.5–15.5)
WBC Count: 5 10*3/uL (ref 4.0–10.5)
nRBC: 0 % (ref 0.0–0.2)

## 2022-03-20 NOTE — Progress Notes (Signed)
[place]  Telephone:(336) [phone] Fax:(336) [phone]    ID: Allison Hanna DOB: 06-22-1961  MR#: [id number]  CBJ#:[id number]  Patient Care Team: Allison Seashore, MD as PCP - General (Internal Medicine) Allison Kaufmann, RN as Oncology Nurse Navigator Allison Germany, RN as Oncology Nurse Navigator Hanna, Allison Dad, MD (Inactive) as Consulting Physician (Oncology) Allison Pray, MD as Consulting Physician (Radiation Oncology) Allison Skates, MD as Consulting Physician (General Surgery) Allison Few, MD as Consulting Physician (Obstetrics and Gynecology) Allison Dresser, MD as Consulting Physician (Cardiology) Allison Hanna as Consulting Physician (Podiatry) Allison Bookbinder, MD as Consulting Physician (Dermatology) Allison Coyer, DO as Consulting Physician (Sports Medicine) Allison Pike, MD OTHER MD:  CHIEF COMPLAINT: Estrogen receptor positive breast cancer  CURRENT TREATMENT: tamoxifen  INTERVAL HISTORY:  Allison Hanna returns today for follow-up of her estrogen receptor positive breast cancer.  She continues on tamoxifen daily.  She has been doing well, tolerating it well.  She also uses vaginal estrogen cream for dryness.  She recently had a mammogram which did not show any evidence of malignancy.  She tries to exercise regularly, walks and plays tennis regularly.  She denies any other complaints today.  Rest of the pertinent 10 point ROS reviewed and negative    COVID 19 VACCINATION STATUS: fully vaccinated Allison Hanna), with booster 03/2020 x2; also had COVID June 2022   HISTORY OF CURRENT ILLNESS: From the original intake note:  "Allison Hanna" presented with a palpable left breast mass. She initially experienced soreness to her left breast around April 2020 and tried to get a mammogram at this point, but due to pandemic concerns, the scan was delayed.  She did not actually feel a mass in the breast until June 1.  She underwent  bilateral diagnostic mammography with tomography and left breast ultrasonography at [facility] on 02/11/2019 showing: breast density category B; irregular hyperechoic mass in the left breast at 4 o'clock 5 cm from the nipple measuring 8 mm; no enlarged adenopathy in the left axilla.  Accordingly on 02/12/2019 she proceeded to biopsy of the left breast area in question. The pathology from this procedure ([id number]) showed: invasive mammary carcinoma, e-cadherin positive. Prognostic indicators significant for: estrogen receptor, 100% positive with strong staining intensity and progesterone receptor, 0% negative. Proliferation marker Ki67 at 25%. HER2 positive by immunohistochemistry, (3+).  The patient's subsequent history is as detailed below.   PAST MEDICAL HISTORY: Past Medical History:  Diagnosis Date   Anxiety    Family history of breast cancer    Family history of melanoma    High cholesterol    Hypothyroid    Paralyzed vocal cords 2014   Personal history of chemotherapy    Personal history of radiation therapy    Raynauds syndrome    cold weather triggers fingers to turn white  History of palpitations (resolved)   PAST SURGICAL HISTORY: Past Surgical History:  Procedure Laterality Date   BREAST BIOPSY Left 05/24/2020   BREAST LUMPECTOMY Left 02/2019   BREAST LUMPECTOMY WITH RADIOACTIVE SEED AND SENTINEL LYMPH NODE BIOPSY Left 03/05/2019   Procedure: LEFT BREAST LUMPECTOMY WITH RADIOACTIVE SEED AND LEFT AXILLARY DEEP SENTINEL LYMPH NODE BIOPSY INJECT BLUE DYE LEFT BREAST;  Surgeon: Allison Skates, MD;  Location: [facility];  Service: General;  Laterality: Left;   KNEE SURGERY Bilateral    arthroscopy for menicus tears   PORTACATH PLACEMENT Right 03/05/2019   Procedure: INSERTION PORT-A-CATH;  Surgeon: Allison Skates, MD;  Location: [facility];  Service: General;  Laterality: Right;   WRIST SURGERY      FAMILY HISTORY: Family History   Problem Relation Age of Onset   Breast cancer Mother 26   Cervical cancer Mother    Heart attack Father    Cervical cancer Maternal Aunt    Breast cancer Paternal Aunt        dx over 9s   Breast cancer Paternal Aunt        dx over 44   Uterine cancer Paternal Aunt 50   Liver cancer Paternal Uncle 52   Stroke Maternal Grandmother    Parkinson's disease Paternal Grandfather    Melanoma Cousin 51       pat first cousin   Skin cancer Brother   Patient's father was 35 years old when he died from heart attack. Patient's mother died at age 79. She was diagnosed with breast cancer at age 73. Two paternal aunts were diagnosed with breast cancer at older ages, one of which also had uterine cancer. She has 1 brother. He has a history of skin cancer.    GYNECOLOGIC HISTORY:  No LMP recorded. Patient is postmenopausal. Menarche: 61 years old Age at first live birth: 61 years old [place] P 3 LMP 2007 Contraceptive: yes, pill for about 8 years, no complications HRT no  Hysterectomy? no BSO? 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  She has a degree in biology.  She is married. Husband Allison Hanna is in charge of operations for [employer]. She lives at home with her husband. Daughter Allison Hanna, age 44, lives in [place], [place] as a Research scientist (life sciences). Daughter Allison Hanna, age 43, lives in [place] as a Careers adviser. Son Allison Hanna, age 63, lives in [place] as a Ship broker and works part-time in [employer]. She has no grandchildren.     ADVANCED DIRECTIVES: Husband Allison Hanna is automatically her HCPOA.   HEALTH MAINTENANCE: Social History   Tobacco Use   Smoking status: Never   Smokeless tobacco: Never  Substance Use Topics   Alcohol use: Yes    Alcohol/week: 2.0 standard drinks of alcohol    Types: 2 Standard drinks or equivalent per week   Drug use: Never     Colonoscopy: 2010  PAP: 08/2018  Bone density:  2019, osteopenia   No Known Allergies  Current Outpatient Medications  Medication Sig Dispense Refill   B Complex-Folic Acid (SUPER B COMPLEX MAXI) TABS Take by mouth.     bimatoprost (LATISSE) 0.03 % ophthalmic solution SMARTSIG:1 Drop(s) Right Eye As Directed     calcium carbonate (OS-CAL) 600 MG TABS tablet Take 1,200 mg by mouth 2 (two) times daily with a meal.     Cholecalciferol (VITAMIN D3) 25 MCG (1000 UT) CAPS Take 2,000 Units by mouth daily.     estradiol (ESTRACE VAGINAL) 0.1 MG/GM vaginal cream Place 1 Applicatorful vaginally 2 (two) times a week. 42.5 g 12   Levothyroxine Sodium (SYNTHROID PO) Take 75 mcg by mouth daily.      Magnesium 250 MG TABS Take 250 mg by mouth daily.     rosuvastatin (CRESTOR) 5 MG tablet TAKE 1 TABLET BY MOUTH EVERY DAY 90 tablet 4   tretinoin (RETIN-A) 0.27 % cream APPLICATIONS APPLY A PEA SIZE TO FACE NIGHTLY AS DIRECTED     vitamin C (ASCORBIC ACID) 500 MG tablet Take 500 mg by mouth daily.  No current facility-administered medications for this visit.   Facility-Administered Medications Ordered in Other Visits  Medication Dose Route Frequency Provider Last Rate Last Admin   sodium chloride flush (NS) 0.9 % injection 10 mL  10 mL Intracatheter PRN Hanna, Allison Dad, MD        OBJECTIVE: White woman who appears younger than stated age  There were no vitals filed for this visit.  Wt Readings from Last 3 Encounters:  08/15/21 160 lb 14.4 oz (73 kg)  07/04/21 158 lb (71.7 kg)  06/08/21 157 lb (71.2 kg)   There is no height or weight on file to calculate BMI.    ECOG FS:1 - Symptomatic but completely ambulatory  Physical Exam Constitutional:      Appearance: Normal appearance.  Chest:     Comments: Bilateral breasts inspected.  No palpable masses or pathologic adenopathy Musculoskeletal:     Cervical back: Normal range of motion and neck supple. No rigidity.  Lymphadenopathy:     Cervical: No cervical adenopathy.  Neurological:      Mental Status: She is alert.     LAB RESULTS:  CMP     Component Value Date/Time   NA 136 08/15/2021 0942   K 4.0 08/15/2021 0942   CL 101 08/15/2021 0942   CO2 29 08/15/2021 0942   GLUCOSE 89 08/15/2021 0942   BUN 25 (H) 08/15/2021 0942   CREATININE 0.79 08/15/2021 0942   CREATININE 0.81 05/12/2019 1145   CALCIUM 9.3 08/15/2021 0942   PROT 7.4 08/15/2021 0942   ALBUMIN 4.3 08/15/2021 0942   AST 19 08/15/2021 0942   AST 19 05/12/2019 1145   ALT 17 08/15/2021 0942   ALT 16 05/12/2019 1145   ALKPHOS 32 (L) 08/15/2021 0942   BILITOT 0.5 08/15/2021 0942   BILITOT 0.3 05/12/2019 1145   GFRNONAA >60 08/15/2021 0942   GFRNONAA >60 05/12/2019 1145   GFRAA >60 04/12/2020 1140   GFRAA >60 05/12/2019 1145    No results found for: "TOTALPROTELP", "ALBUMINELP", "A1GS", "A2GS", "BETS", "BETA2SER", "GAMS", "MSPIKE", "SPEI"  No results found for: "KPAFRELGTCHN", "LAMBDASER", "KAPLAMBRATIO"  Lab Results  Component Value Date   WBC 5.1 08/15/2021   NEUTROABS 3.8 08/15/2021   HGB 12.4 08/15/2021   HCT 37.4 08/15/2021   MCV 92.8 08/15/2021   PLT 258 08/15/2021    No results found for: "LABCA2"  No components found for: "FTDDUK025"  No results for input(s): "INR" in the last 168 hours.  No results found for: "LABCA2"  No results found for: "KYH062"  No results found for: "CAN125"  No results found for: "CAN153"  No results found for: "CA2729"  No components found for: "HGQUANT"  No results found for: "CEA1", "CEA" / No results found for: "CEA1", "CEA"   No results found for: "AFPTUMOR"  No results found for: "CHROMOGRNA"  No results found for: "HGBA", "HGBA2QUANT", "HGBFQUANT", "HGBSQUAN" (Hemoglobinopathy evaluation)   No results found for: "LDH"  No results found for: "IRON", "TIBC", "IRONPCTSAT" (Iron and TIBC)  No results found for: "FERRITIN"  Urinalysis No results found for: "COLORURINE", "APPEARANCEUR", "LABSPEC", "PHURINE", "GLUCOSEU", "HGBUR",  "BILIRUBINUR", "KETONESUR", "PROTEINUR", "UROBILINOGEN", "NITRITE", "LEUKOCYTESUR"   STUDIES: MM DIAG BREAST TOMO BILATERAL  Result Date: 03/06/2022 CLINICAL DATA:  History of LEFT breast cancer in 2020 status post lumpectomy. EXAM: DIGITAL DIAGNOSTIC BILATERAL MAMMOGRAM WITH TOMOSYNTHESIS AND CAD TECHNIQUE: Bilateral digital diagnostic mammography and breast tomosynthesis was performed. The images were evaluated with computer-aided detection. COMPARISON:  Previous exam(s). ACR Breast Density Category c: The  breast tissue is heterogeneously dense, which may obscure small masses. FINDINGS: There are stable postsurgical changes within the LEFT breast. There are no new dominant masses, suspicious calcifications or secondary signs of malignancy within either breast. IMPRESSION: No evidence of malignancy within either breast. Stable postsurgical changes within the LEFT breast. RECOMMENDATION: 1.  Screening mammogram in one year.(Code:[id number]-B-01Y) 2. Per protocol, as the patient is now 2 or more years status post lumpectomy, she may return to annual screening mammography in 1 year. However, given the history of breast cancer, the patient remains eligible for annual diagnostic mammography if preferred. 3. Given patient's personal history of breast cancer, would consider the addition of annual screening breast MRI to annual screening mammography. Per American Cancer Society guidelines, annual screening MRI of the breasts is recommended if a risk assessment calculation for breast cancer, preferably using the Tyrer-Cuzick or Gail model, measures greater than 20% or for patients who are known or suspected to be positive for the breast cancer gene. If patient does not reach the 20% score, patient would be eligible for abbreviated breast MRI. This was discussed with the patient today. I have discussed the findings and recommendations with the patient. If applicable, a reminder letter will be sent to the patient regarding the  next appointment. BI-RADS CATEGORY  2: Benign. Electronically Signed   By: Franki Cabot M.D.   On: 03/06/2022 12:21     ELIGIBLE FOR AVAILABLE RESEARCH PROTOCOL: no  ASSESSMENT:   61 y.o. [place] woman status post left breast biopsy 02/12/2019 for a clinical T1b N0, stage IA invasive ductal carcinoma, estrogen receptor positive, progesterone receptor negative, HER-2 amplified, with an MIB-1-1 of 25%  (1) status post left lumpectomy and sentinel lymph node sampling 03/05/2019 for a pT1c pN0, stage IA invasive ductal carcinoma, grade 2, with positive lymphovascular invasion but negative margins  (a) a total of 8 lymph nodes removed (4 sentinel)   (2) adjuvant chemotherapy with paclitaxel and trastuzumab weekly x12 started 04/07/2019  (a) paclitaxel stopped after 4 doses because of peripheral neuropathy  (b) cyclophosphamide, methotrexate, fluorouracil started 05/12/2019, to be repeated q. 21 days x 3  (3) trastuzumab continued to complete 1 year (through August 2021)  (a) echo 02/26/2019 shows an ejection fraction in the 60-65% range  (b) echo 05/27/2019 shows an EF of 55-60%  (c) echo 08/07/2019 shows an ejection fraction in the 60-65% range  (d) echo 11/19/2019 shows an ejection fraction in the 60-65% range  (e) echo 03/03/2020 with an ejection fraction in the 55-60% range   (4) adjuvant radiation 07/15/2019 - 08/13/2019  (a) left breast / 40.05 Gy in 15 fractions  (b) boost / 12 Gy in 6 fractions  (5) tamoxifen started 10/17/2019  (6) persistent left lateral breast mass  (a) ultrasound-guided biopsy 05/24/2020 shows benign lymph node tissue  (b) left mammography and ultrasonography 11/25/2020 shows small lymph node, stable  (6) genetics testing 02/25/2019 through the Multi-Gene Panel offered by [facility] found no deleterious mutations in AIP, ALK, APC, ATM, AXIN2,BAP1,  BARD1, BLM, BMPR1A, BRCA1, BRCA2, BRIP1, CASR, CDC73, CDH1, CDK4, CDKN1B, CDKN1C, CDKN2A (p14ARF), CDKN2A  (p16INK4a), CEBPA, CHEK2, CTNNA1, DICER1, DIS3L2, EGFR (c.2369C>T, p.Thr790Met variant only), EPCAM (Deletion/duplication testing only), FH, FLCN, GATA2, GPC3, GREM1 (Promoter region deletion/duplication testing only), HOXB13 (c.251G>A, p.Gly84Glu), HRAS, KIT, MAX, MEN1, MET, MITF (c.952G>A, p.Glu318Lys variant only), MLH1, MSH2, MSH3, MSH6, MUTYH, NBN, NF1, NF2, NTHL1, PALB2, PDGFRA, PHOX2B, PMS2, POLD1, POLE, POT1, PRKAR1A, PTCH1, PTEN, RAD50, RAD51C, RAD51D, RB1, RECQL4, RET, RNF43, RUNX1, SDHAF2, SDHA (  sequence changes only), SDHB, SDHC, SDHD, SMAD4, SMARCA4, SMARCB1, SMARCE1, STK11, SUFU, TERC, TERT, TMEM127, TP53, TSC1, TSC2, VHL, WRN and WT1.     PLAN:  Ms. Brener is here for follow-up.  She has been tolerating tamoxifen well, plan is to continue it for 10 years.  She also uses vaginal estrogen for vaginal dryness.  Since last visit, she denies any new changes.  She recently had a mammogram which was also unremarkable. She continues to exercise regularly, walks, plays tennis.   We have once again discussed about no role of antiestrogen therapy beyond 10 years. She will have annual mammograms and will return to clinic in about a year Total encounter time 30 minutes.*  *Total Encounter Time as defined by the Centers for Medicare and Medicaid Services includes, in addition to the face-to-face time of a patient visit (documented in the note above) non-face-to-face time: obtaining and reviewing outside history, ordering and reviewing medications, tests or procedures, care coordination (communications with other health care professionals or caregivers) and documentation in the medical record.

## 2022-09-14 ENCOUNTER — Other Ambulatory Visit: Payer: Self-pay | Admitting: *Deleted

## 2022-09-14 MED ORDER — TAMOXIFEN CITRATE 20 MG PO TABS: 20.0000 mg | ORAL_TABLET | Freq: Every day | ORAL | 4 refills | Status: DC

## 2023-03-08 ENCOUNTER — Ambulatory Visit
Admission: RE | Admit: 2023-03-08 | Discharge: 2023-03-08 | Disposition: A | Discharge status: Home / Self Care | Payer: Managed Care, Other (non HMO) | Source: Ambulatory Visit | Attending: Hematology and Oncology | Admitting: Hematology and Oncology

## 2023-03-08 DIAGNOSIS — Z170 Estrogen receptor positive status [ER+]: Secondary | ICD-10-CM

## 2023-03-11 ENCOUNTER — Other Ambulatory Visit: Payer: Self-pay | Admitting: Hematology and Oncology

## 2023-03-11 DIAGNOSIS — Z853 Personal history of malignant neoplasm of breast: Secondary | ICD-10-CM

## 2023-03-21 ENCOUNTER — Inpatient Hospital Stay: Payer: Managed Care, Other (non HMO) | Attending: Hematology and Oncology | Admitting: Hematology and Oncology

## 2023-03-21 ENCOUNTER — Other Ambulatory Visit: Payer: Self-pay

## 2023-03-21 VITALS — BP 145/84 | HR 64 | Temp 97.5°F | Resp 18 | Ht 67.0 in | Wt 163.0 lb

## 2023-03-21 DIAGNOSIS — C50912 Malignant neoplasm of unspecified site of left female breast: Secondary | ICD-10-CM | POA: Insufficient documentation

## 2023-03-21 DIAGNOSIS — C50512 Malignant neoplasm of lower-outer quadrant of left female breast: Secondary | ICD-10-CM

## 2023-03-21 DIAGNOSIS — Z79810 Long term (current) use of selective estrogen receptor modulators (SERMs): Secondary | ICD-10-CM | POA: Insufficient documentation

## 2023-03-21 DIAGNOSIS — Z170 Estrogen receptor positive status [ER+]: Secondary | ICD-10-CM | POA: Diagnosis not present

## 2023-03-21 NOTE — Progress Notes (Signed)
[facility]  [phone] Fax:([phone]) [phone]    ID: Allison Hanna DOB: 06-11-1961  MR#: [id number]  XLK#:[id number]  Patient Care Team: Georgianne Fick, MD as PCP - General (Internal Medicine) Pershing Proud, RN as Oncology Nurse Navigator Donnelly Angelica, RN as Oncology Nurse Navigator Antony Blackbird, MD as Consulting Physician (Radiation Oncology) Claud Kelp, MD as Consulting Physician (General Surgery) Olivia Mackie, MD as Consulting Physician (Obstetrics and Gynecology) Laurey Morale, MD as Consulting Physician (Cardiology) Rosalva Ferron, Gillie Manners, DPM as Consulting Physician (Podiatry) Venancio Poisson, MD as Consulting Physician (Dermatology) Ralene Cork, DO as Consulting Physician (Sports Medicine) Rachel Moulds, MD as Consulting Physician (Hematology and Oncology) Rachel Moulds, MD  CHIEF COMPLAINT: Estrogen receptor positive breast cancer  CURRENT TREATMENT: tamoxifen  INTERVAL HISTORY:  Marayah returns today for follow-up of her estrogen receptor positive breast cancer.  She continues on tamoxifen daily.  She has been doing well, tolerating it well.  She also uses vaginal estrogen cream for dryness.  She recently had a mammogram which did not show any evidence of malignancy.  She tries to exercise regularly, walks and plays tennis regularly.  She has been walking 5 times a week and plays tennis the other days. She is hoping to jog. She denies any other complaints today.  Rest of the pertinent 10 point ROS reviewed and negative    COVID 19 VACCINATION STATUS: fully vaccinated [employer]), with booster 03/2020 x2; also had COVID June 2022   HISTORY OF CURRENT ILLNESS: From the original intake note:  "Alexiss" presented with a palpable left breast mass. She initially experienced soreness to her left breast around April 2020 and tried to get a mammogram at this point, but due to pandemic concerns, the scan was delayed.  She  did not actually feel a mass in the breast until June 1.  She underwent bilateral diagnostic mammography with tomography and left breast ultrasonography at [facility] on 02/11/2019 showing: breast density category B; irregular hyperechoic mass in the left breast at 4 o'clock 5 cm from the nipple measuring 8 mm; no enlarged adenopathy in the left axilla.  Accordingly on 02/12/2019 she proceeded to biopsy of the left breast area in question. The pathology from this procedure ([id number]) showed: invasive mammary carcinoma, e-cadherin positive. Prognostic indicators significant for: estrogen receptor, 100% positive with strong staining intensity and progesterone receptor, 0% negative. Proliferation marker Ki67 at 25%. HER2 positive by immunohistochemistry, (3+).  The patient's subsequent history is as detailed below.   PAST MEDICAL HISTORY: Past Medical History:  Diagnosis Date   Anxiety    Family history of breast cancer    Family history of melanoma    High cholesterol    Hypothyroid    Paralyzed vocal cords 2014   Personal history of chemotherapy    Personal history of radiation therapy    Raynauds syndrome    cold weather triggers fingers to turn white  History of palpitations (resolved)   PAST SURGICAL HISTORY: Past Surgical History:  Procedure Laterality Date   BREAST BIOPSY Left 05/24/2020   BREAST LUMPECTOMY Left 02/2019   BREAST LUMPECTOMY WITH RADIOACTIVE SEED AND SENTINEL LYMPH NODE BIOPSY Left 03/05/2019   Procedure: LEFT BREAST LUMPECTOMY WITH RADIOACTIVE SEED AND LEFT AXILLARY DEEP SENTINEL LYMPH NODE BIOPSY INJECT BLUE DYE LEFT BREAST;  Surgeon: Claud Kelp, MD;  Location: [facility];  Service: General;  Laterality: Left;   KNEE SURGERY Bilateral    arthroscopy for menicus tears  PORTACATH PLACEMENT Right 03/05/2019   Procedure: INSERTION PORT-A-CATH;  Surgeon: Claud Kelp, MD;  Location: [facility];  Service: General;   Laterality: Right;   WRIST SURGERY      FAMILY HISTORY: Family History  Problem Relation Age of Onset   Breast cancer Mother 82   Cervical cancer Mother    Heart attack Father    Cervical cancer Maternal Aunt    Breast cancer Paternal Aunt        dx over 33s   Breast cancer Paternal Aunt        dx over 43   Uterine cancer Paternal Aunt 74   Liver cancer Paternal Uncle 38   Stroke Maternal Grandmother    Parkinson's disease Paternal Grandfather    Melanoma Cousin 40       pat first cousin   Skin cancer Brother   Patient's father was 15 years old when he died from heart attack. Patient's mother died at age 79. She was diagnosed with breast cancer at age 65. Two paternal aunts were diagnosed with breast cancer at older ages, one of which also had uterine cancer. She has 1 brother. He has a history of skin cancer.    GYNECOLOGIC HISTORY:  No LMP recorded. Patient is postmenopausal. Menarche: 62 years old Age at first live birth: 62 years old GX P 3 LMP 2007 Contraceptive: yes, pill for about 8 years, no complications HRT no  Hysterectomy? no BSO? 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  She has a degree in biology.  She is married. Husband Clovis Riley is in charge of operations for [employer] She lives at home with her husband. Daughter Fleet Contras, age 64, lives in [place], [place] as a Journalist, newspaper. Daughter Dorene Grebe, age 64, lives in [place] as a Dentist. Son Loraine Leriche, age 54, lives in [place] as a Consulting civil engineer and works part-time in [employer]. She has no grandchildren.     ADVANCED DIRECTIVES: Husband Clovis Riley is automatically her HCPOA.   HEALTH MAINTENANCE: Social History   Tobacco Use   Smoking status: Never   Smokeless tobacco: Never  Substance Use Topics   Alcohol use: Yes    Alcohol/week: 2.0 standard drinks of alcohol    Types: 2 Standard drinks or equivalent per  week   Drug use: Never     Colonoscopy: 2010  PAP: 08/2018  Bone density: 2019, osteopenia   No Known Allergies  Current Outpatient Medications  Medication Sig Dispense Refill   B Complex-Folic Acid (SUPER B COMPLEX MAXI) TABS Take by mouth.     bimatoprost (LATISSE) 0.03 % ophthalmic solution SMARTSIG:1 Drop(s) Right Eye As Directed     calcium carbonate (OS-CAL) 600 MG TABS tablet Take 1,200 mg by mouth 2 (two) times daily with a meal.     Cholecalciferol (VITAMIN D3) 25 MCG (1000 UT) CAPS Take 2,000 Units by mouth daily.     estradiol (ESTRACE VAGINAL) 0.1 MG/GM vaginal cream Place 1 Applicatorful vaginally 2 (two) times a week. 42.5 g 12   Levothyroxine Sodium (SYNTHROID PO) Take 75 mcg by mouth daily.      Magnesium 250 MG TABS Take 250 mg by mouth daily.     rosuvastatin (CRESTOR) 5 MG tablet TAKE 1 TABLET BY MOUTH EVERY DAY 90 tablet 4   tamoxifen (NOLVADEX) 20 MG tablet Take 1 tablet (20 mg total) by mouth daily. 90 tablet 4  tretinoin (RETIN-A) 0.05 % cream APPLICATIONS APPLY A PEA SIZE TO FACE NIGHTLY AS DIRECTED     vitamin C (ASCORBIC ACID) 500 MG tablet Take 500 mg by mouth daily.     No current facility-administered medications for this visit.   Facility-Administered Medications Ordered in Other Visits  Medication Dose Route Frequency Provider Last Rate Last Admin   sodium chloride flush (NS) 0.9 % injection 10 mL  10 mL Intracatheter PRN Magrinat, Valentino Hue, MD        OBJECTIVE: White woman who appears younger than stated age  Vitals:   03/21/23 1151  BP: (!) 145/84  Pulse: 64  Resp: 18  Temp: (!) 97.5 F (36.4 C)  SpO2: 99%    Wt Readings from Last 3 Encounters:  03/21/23 163 lb (73.9 kg)  03/20/22 162 lb (73.5 kg)  08/15/21 160 lb 14.4 oz (73 kg)   Body mass index is 25.53 kg/m.    ECOG FS:1 - Symptomatic but completely ambulatory  Physical Exam Constitutional:      Appearance: Normal appearance.  Chest:     Comments: Bilateral breasts  inspected.  No palpable masses or pathologic adenopathy Musculoskeletal:     Cervical back: Normal range of motion and neck supple. No rigidity.  Lymphadenopathy:     Cervical: No cervical adenopathy.  Neurological:     Mental Status: She is alert.     LAB RESULTS:  CMP     Component Value Date/Time   NA 134 (L) 03/20/2022 1158   K 4.3 03/20/2022 1158   CL 101 03/20/2022 1158   CO2 28 03/20/2022 1158   GLUCOSE 99 03/20/2022 1158   BUN 16 03/20/2022 1158   CREATININE 0.94 03/20/2022 1158   CALCIUM 8.8 (L) 03/20/2022 1158   PROT 6.8 03/20/2022 1158   ALBUMIN 4.1 03/20/2022 1158   AST 16 03/20/2022 1158   ALT 14 03/20/2022 1158   ALKPHOS 33 (L) 03/20/2022 1158   BILITOT 0.3 03/20/2022 1158   GFRNONAA >60 03/20/2022 1158   GFRAA >60 04/12/2020 1140   GFRAA >60 05/12/2019 1145    No results found for: "TOTALPROTELP", "ALBUMINELP", "A1GS", "A2GS", "BETS", "BETA2SER", "GAMS", "MSPIKE", "SPEI"  No results found for: "KPAFRELGTCHN", "LAMBDASER", "KAPLAMBRATIO"  Lab Results  Component Value Date   WBC 5.0 03/20/2022   NEUTROABS 3.4 03/20/2022   HGB 11.9 (L) 03/20/2022   HCT 35.0 (L) 03/20/2022   MCV 91.4 03/20/2022   PLT 244 03/20/2022    No results found for: "LABCA2"  No components found for: "MWUXLK440"  No results for input(s): "INR" in the last 168 hours.  No results found for: "LABCA2"  No results found for: "NUU725"  No results found for: "CAN125"  No results found for: "CAN153"  No results found for: "CA2729"  No components found for: "HGQUANT"  No results found for: "CEA1", "CEA" / No results found for: "CEA1", "CEA"   No results found for: "AFPTUMOR"  No results found for: "CHROMOGRNA"  No results found for: "HGBA", "HGBA2QUANT", "HGBFQUANT", "HGBSQUAN" (Hemoglobinopathy evaluation)   No results found for: "LDH"  No results found for: "IRON", "TIBC", "IRONPCTSAT" (Iron and TIBC)  No results found for: "FERRITIN"  Urinalysis No results  found for: "COLORURINE", "APPEARANCEUR", "LABSPEC", "PHURINE", "GLUCOSEU", "HGBUR", "BILIRUBINUR", "KETONESUR", "PROTEINUR", "UROBILINOGEN", "NITRITE", "LEUKOCYTESUR"   STUDIES: MM DIAG BREAST TOMO BILATERAL  Result Date: 03/08/2023 CLINICAL DATA:  62 year old female presenting for annual exam. History of left breast cancer status post lumpectomy in 2020. No new problems. EXAM: DIGITAL DIAGNOSTIC BILATERAL  MAMMOGRAM WITH TOMOSYNTHESIS AND CAD TECHNIQUE: Bilateral digital diagnostic mammography and breast tomosynthesis was performed. The images were evaluated with computer-aided detection. COMPARISON:  Previous exam(s). ACR Breast Density Category c: The breasts are heterogeneously dense, which may obscure small masses. FINDINGS: Right breast: No suspicious mass, distortion, or microcalcifications are identified to suggest presence of malignancy. Left breast: A spot 2D magnification view of the lumpectomy site was performed in addition to standard views. There are stable postsurgical changes. No suspicious mass, distortion, or microcalcifications are identified to suggest presence of malignancy. IMPRESSION: No mammographic evidence of malignancy in the bilateral breasts. Stable postsurgical changes in the left breast. RECOMMENDATION: Diagnostic bilateral mammogram in 1 year. I have discussed the findings and recommendations with the patient. If applicable, a reminder letter will be sent to the patient regarding the next appointment. BI-RADS CATEGORY  2: Benign. Electronically Signed   By: Emmaline Kluver M.D.   On: 03/08/2023 09:25     ELIGIBLE FOR AVAILABLE RESEARCH PROTOCOL: no  ASSESSMENT:   62 y.o. [place] woman status post left breast biopsy 02/12/2019 for a clinical T1b N0, stage IA invasive ductal carcinoma, estrogen receptor positive, progesterone receptor negative, HER-2 amplified, with an MIB-1-1 of 25%  (1) status post left lumpectomy and sentinel lymph node sampling 03/05/2019 for a  pT1c pN0, stage IA invasive ductal carcinoma, grade 2, with positive lymphovascular invasion but negative margins  (a) a total of 8 lymph nodes removed (4 sentinel)   (2) adjuvant chemotherapy with paclitaxel and trastuzumab weekly x12 started 04/07/2019  (a) paclitaxel stopped after 4 doses because of peripheral neuropathy  (b) cyclophosphamide, methotrexate, fluorouracil started 05/12/2019, to be repeated q. 21 days x 3  (3) trastuzumab continued to complete 1 year (through August 2021)  (a) echo 02/26/2019 shows an ejection fraction in the 60-65% range  (b) echo 05/27/2019 shows an EF of 55-60%  (c) echo 08/07/2019 shows an ejection fraction in the 60-65% range  (d) echo 11/19/2019 shows an ejection fraction in the 60-65% range  (e) echo 03/03/2020 with an ejection fraction in the 55-60% range   (4) adjuvant radiation 07/15/2019 - 08/13/2019  (a) left breast / 40.05 Gy in 15 fractions  (b) boost / 12 Gy in 6 fractions  (5) tamoxifen started 10/17/2019  (6) persistent left lateral breast mass  (a) ultrasound-guided biopsy 05/24/2020 shows benign lymph node tissue  (b) left mammography and ultrasonography 11/25/2020 shows small lymph node, stable  (6) genetics testing 02/25/2019 through the Multi-Gene Panel offered by [facility] found no deleterious mutations in AIP, ALK, APC, ATM, AXIN2,BAP1,  BARD1, BLM, BMPR1A, BRCA1, BRCA2, BRIP1, CASR, CDC73, CDH1, CDK4, CDKN1B, CDKN1C, CDKN2A (p14ARF), CDKN2A (p16INK4a), CEBPA, CHEK2, CTNNA1, DICER1, DIS3L2, EGFR (c.2369C>T, p.Thr790Met variant only), EPCAM (Deletion/duplication testing only), FH, FLCN, GATA2, GPC3, GREM1 (Promoter region deletion/duplication testing only), HOXB13 (c.251G>A, p.Gly84Glu), HRAS, KIT, MAX, MEN1, MET, MITF (c.952G>A, p.Glu318Lys variant only), MLH1, MSH2, MSH3, MSH6, MUTYH, NBN, NF1, NF2, NTHL1, PALB2, PDGFRA, PHOX2B, PMS2, POLD1, POLE, POT1, PRKAR1A, PTCH1, PTEN, RAD50, RAD51C, RAD51D, RB1, RECQL4, RET, RNF43, RUNX1, SDHAF2,  SDHA (sequence changes only), SDHB, SDHC, SDHD, SMAD4, SMARCA4, SMARCB1, SMARCE1, STK11, SUFU, TERC, TERT, TMEM127, TP53, TSC1, TSC2, VHL, WRN and WT1.     PLAN:  Ms. Mcqueen is here for follow-up.  She has been tolerating tamoxifen well, plan is to continue it for 10 years.  She also uses vaginal estrogen for vaginal dryness.  Since last visit, she denies any new changes.  She recently had a mammogram which was also  unremarkable. She continues to exercise regularly, walks, plays tennis. I applauded her exercise regimen. We have also discussed about strength training in addition to cardio.   We have once again discussed about no role of antiestrogen therapy beyond 10 years. She will have annual mammograms and will return to clinic in about a year Total encounter time 30 minutes.*  *Total Encounter Time as defined by the Centers for Medicare and Medicaid Services includes, in addition to the face-to-face time of a patient visit (documented in the note above) non-face-to-face time: obtaining and reviewing outside history, ordering and reviewing medications, tests or procedures, care coordination (communications with other health care professionals or caregivers) and documentation in the medical record.

## 2023-07-17 ENCOUNTER — Encounter: Payer: Self-pay | Admitting: Oncology

## 2023-07-17 NOTE — Telephone Encounter (Signed)
Telephone call  

## 2023-07-18 ENCOUNTER — Encounter: Payer: Self-pay | Admitting: Oncology

## 2023-07-18 NOTE — Telephone Encounter (Signed)
Telephone call  

## 2023-10-07 ENCOUNTER — Other Ambulatory Visit: Payer: Self-pay | Admitting: Hematology and Oncology

## 2024-03-09 ENCOUNTER — Encounter: Payer: Self-pay | Admitting: Oncology

## 2024-03-09 ENCOUNTER — Other Ambulatory Visit: Payer: Self-pay | Admitting: Hematology and Oncology

## 2024-03-09 ENCOUNTER — Ambulatory Visit
Admission: RE | Admit: 2024-03-09 | Discharge: 2024-03-09 | Disposition: A | Discharge status: Home / Self Care | Source: Ambulatory Visit | Attending: Hematology and Oncology

## 2024-03-09 ENCOUNTER — Ambulatory Visit
Admission: RE | Admit: 2024-03-09 | Discharge: 2024-03-09 | Disposition: A | Discharge status: Home / Self Care | Payer: Managed Care, Other (non HMO) | Source: Ambulatory Visit | Attending: Hematology and Oncology

## 2024-03-09 DIAGNOSIS — Z853 Personal history of malignant neoplasm of breast: Secondary | ICD-10-CM

## 2024-03-19 ENCOUNTER — Telehealth: Payer: Self-pay | Admitting: *Deleted

## 2024-03-19 NOTE — Telephone Encounter (Signed)
 Pt confirmed appt for 03/20/24.

## 2024-03-20 ENCOUNTER — Inpatient Hospital Stay: Payer: Managed Care, Other (non HMO) | Attending: Hematology and Oncology | Admitting: Hematology and Oncology

## 2024-03-20 VITALS — BP 135/75 | HR 69 | Temp 98.4°F | Resp 17 | Ht 67.0 in | Wt 166.0 lb

## 2024-03-20 DIAGNOSIS — C50512 Malignant neoplasm of lower-outer quadrant of left female breast: Secondary | ICD-10-CM | POA: Insufficient documentation

## 2024-03-20 DIAGNOSIS — Z79810 Long term (current) use of selective estrogen receptor modulators (SERMs): Secondary | ICD-10-CM | POA: Diagnosis not present

## 2024-03-20 DIAGNOSIS — Z170 Estrogen receptor positive status [ER+]: Secondary | ICD-10-CM | POA: Diagnosis not present

## 2024-03-20 DIAGNOSIS — M8580 Other specified disorders of bone density and structure, unspecified site: Secondary | ICD-10-CM | POA: Insufficient documentation

## 2024-03-20 NOTE — Addendum Note (Signed)
 Addended by: Codie Krogh, NAGA VENKATA KALIPRAVEENA on: 03/20/2024 02:20 PM   Modules accepted: Orders

## 2024-03-20 NOTE — Progress Notes (Signed)
 [facility]  Telephone:([phone]) [phone] [phone]) [phone]    ID: Allison Hanna DOB: [birth year]  MR#: [id number]  RDW#:[id number]  Patient Care Team: Verdia Lombard, MD as PCP - General (Internal Medicine) Glean Stephane JAYSON, RN (Inactive) as Oncology Nurse Navigator Tyree Nanetta SAILOR, RN as Oncology Nurse Navigator Shannon Agent, MD as Consulting Physician (Radiation Oncology) Gail Favorite, MD as Consulting Physician (General Surgery) Gorge Ade, MD as Consulting Physician (Obstetrics and Gynecology) Rolan Ezra RAMAN, MD as Consulting Physician (Cardiology) Alex Glendia JONELLE Tobie, Franky SQUIBB, DPM as Consulting Physician (Podiatry) Cary Doffing, MD as Consulting Physician (Dermatology) Arvell Evalene JONELLE, DO as Consulting Physician (Sports Medicine) Loretha Ash, MD as Consulting Physician (Hematology and Oncology) Ash Loretha, MD  CHIEF COMPLAINT: Estrogen receptor positive breast cancer  CURRENT TREATMENT: tamoxifen   INTERVAL HISTORY:  Allison Hanna returns today for follow-up of her estrogen receptor positive breast cancer.  Discussed the use of AI scribe software for clinical note transcription with the patient, who gave verbal consent to proceed.  History of Present Illness Allison Hanna is a 63 year old female with breast cancer who presents for follow-up regarding scar tissue and medication side effects.  She experiences persistent soreness and scar tissue along her ribs following previous breast cancer surgery. The area is tender to touch, and she notes a 'little knot' that she is unsure about. A mammogram and ultrasound showed normal results with only scar tissue and dense tissue in the outer portions of the breast. A biopsy confirmed the absence of any suspicious lumps.  She is currently taking tamoxifen  with no new side effects. She is also on rosuvastatin , which has successfully lowered her LDL cholesterol from 140 mg/dL to a healthier level without  affecting her HDL cholesterol, which remains high at 95-105 mg/dL. However, she experiences severe night cramps in her legs, which she attributes to the rosuvastatin . These cramps occur at night, causing significant discomfort and difficulty moving. She takes the medication before bed.  Her past medical history includes osteopenia, which has improved since starting tamoxifen , as her bone density has increased. She engages in weight-bearing activities such as tennis, which she believes has contributed to her improved bone health.  She mentions a family history of anxiety and depression, particularly in her son. 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  Rest of the pertinent 10 point ROS reviewed and negative    COVID 19 VACCINATION STATUS: fully vaccinated [employer]), with booster 03/2020 x2; also had COVID June 2022   HISTORY OF CURRENT ILLNESS: From the original intake note:  Allison Hanna presented with a palpable left breast mass. She initially experienced soreness to her left breast around April 2020 and tried to get a mammogram at this point, but due to pandemic concerns, the scan was delayed.  She did not actually feel a mass in the breast until June 1.  She underwent bilateral diagnostic mammography with tomography and left breast ultrasonography at [facility] on 02/11/2019 showing: breast density category B; irregular hyperechoic mass in the left breast at 4 o'clock 5 cm from the nipple measuring 8 mm; no enlarged adenopathy in the left axilla.  Accordingly on 02/12/2019 she proceeded to biopsy of the left breast area in question. The pathology from this procedure ([id number]) showed: invasive mammary carcinoma, e-cadherin positive. Prognostic indicators significant for: estrogen receptor, 100% positive with strong staining intensity and progesterone receptor, 0% negative. Proliferation  marker  Ki67 at 25%. HER2 positive by immunohistochemistry, (3+).  The patient's subsequent history is as detailed below.   PAST MEDICAL HISTORY: Past Medical History:  Diagnosis Date   Anxiety    Family history of breast cancer    Family history of melanoma    High cholesterol    Hypothyroid    Paralyzed vocal cords 2014   Personal history of chemotherapy    Personal history of radiation therapy    Raynauds syndrome    cold weather triggers fingers to turn white  History of palpitations (resolved)   PAST SURGICAL HISTORY: Past Surgical History:  Procedure Laterality Date   BREAST BIOPSY Left 05/24/2020   BREAST LUMPECTOMY Left 02/2019   BREAST LUMPECTOMY WITH RADIOACTIVE SEED AND SENTINEL LYMPH NODE BIOPSY Left 03/05/2019   Procedure: LEFT BREAST LUMPECTOMY WITH RADIOACTIVE SEED AND LEFT AXILLARY DEEP SENTINEL LYMPH NODE BIOPSY INJECT BLUE DYE LEFT BREAST;  Surgeon: Gail Favorite, MD;  Location: [facility];  Service: General;  Laterality: Left;   KNEE SURGERY Bilateral    arthroscopy for menicus tears   PORTACATH PLACEMENT Right 03/05/2019   Procedure: INSERTION PORT-A-CATH;  Surgeon: Gail Favorite, MD;  Location: [facility];  Service: General;  Laterality: Right;   WRIST SURGERY      FAMILY HISTORY: Family History  Problem Relation Age of Onset   Breast cancer Mother 59   Cervical cancer Mother    Heart attack Father    Cervical cancer Maternal Aunt    Breast cancer Paternal Aunt        dx over 72s   Breast cancer Paternal Aunt        dx over 36   Uterine cancer Paternal Aunt 15   Liver cancer Paternal Uncle 73   Stroke Maternal Grandmother    Parkinson's disease Paternal Grandfather    Melanoma Cousin 40       pat first cousin   Skin cancer Brother   Patient's father was 10 years old when he died from heart attack. Patient's mother died at age 63. She was diagnosed with breast cancer at age 40. Two paternal aunts were diagnosed with  breast cancer at older ages, one of which also had uterine cancer. She has 1 brother. He has a history of skin cancer.    GYNECOLOGIC HISTORY:  No LMP recorded. Patient is postmenopausal. Menarche: 63 years old Age at first live birth: 63 years old GX P 3 LMP 2007 Contraceptive: yes, pill for about 8 years, no complications HRT no  Hysterectomy? no BSO? 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  She has a degree in biology.  She is married. Husband Allison Hanna is in charge of operations for [employer] She lives at home with her husband. Daughter Allison Hanna, age 63, lives in [place], [place] as a Journalist, newspaper. Daughter Allison Hanna, age 70, lives in [place] as a Dentist. Son Allison Hanna, age 54, lives in [place] as a Consulting civil engineer and works part-time in [employer]. She has no grandchildren.     ADVANCED DIRECTIVES: Husband Allison Hanna is automatically her HCPOA.   HEALTH MAINTENANCE: Social History   Tobacco Use   Smoking status: Never   Smokeless tobacco: Never  Substance Use Topics   Alcohol use: Yes    Alcohol/week: 2.0 standard drinks of alcohol    Types: 2 Standard drinks or equivalent per week   Drug use: Never  Colonoscopy: 2010  PAP: 08/2018  Bone density: 2019, osteopenia   No Known Allergies  Current Outpatient Medications  Medication Sig Dispense Refill   B Complex-Folic Acid (SUPER B COMPLEX MAXI) TABS Take by mouth.     bimatoprost (LATISSE) 0.03 % ophthalmic solution SMARTSIG:1 Drop(s) Right Eye As Directed     calcium  carbonate (OS-CAL) 600 MG TABS tablet Take 1,200 mg by mouth 2 (two) times daily with a meal.     Cholecalciferol (VITAMIN D3) 25 MCG (1000 UT) CAPS Take 2,000 Units by mouth daily.     estradiol  (ESTRACE  VAGINAL) 0.1 MG/GM vaginal cream Place 1 Applicatorful vaginally 2 (two) times a week. 42.5 g 12   Levothyroxine Sodium (SYNTHROID PO) Take 75 mcg by mouth  daily.      Magnesium 250 MG TABS Take 250 mg by mouth daily.     rosuvastatin  (CRESTOR ) 5 MG tablet TAKE 1 TABLET BY MOUTH EVERY DAY 90 tablet 4   tamoxifen  (NOLVADEX ) 20 MG tablet TAKE 1 TABLET BY MOUTH EVERY DAY 90 tablet 4   tretinoin (RETIN-A) 0.05 % cream APPLICATIONS APPLY A PEA SIZE TO FACE NIGHTLY AS DIRECTED     vitamin C (ASCORBIC ACID) 500 MG tablet Take 500 mg by mouth daily.     No current facility-administered medications for this visit.   Facility-Administered Medications Ordered in Other Visits  Medication Dose Route Frequency Provider Last Rate Last Admin   sodium chloride  flush (NS) 0.9 % injection 10 mL  10 mL Intracatheter PRN Magrinat, Sandria BROCKS, MD        OBJECTIVE: White woman who appears younger than stated age  There were no vitals filed for this visit.   Wt Readings from Last 3 Encounters:  03/21/23 163 lb (73.9 kg)  03/20/22 162 lb (73.5 kg)  08/15/21 160 lb 14.4 oz (73 kg)   There is no height or weight on file to calculate BMI.    ECOG FS:1 - Symptomatic but completely ambulatory  Physical Exam Constitutional:      Appearance: Normal appearance.  Chest:     Comments: Bilateral breasts inspected.  No palpable masses or pathologic adenopathy Musculoskeletal:     Cervical back: Normal range of motion and neck supple. No rigidity.  Lymphadenopathy:     Cervical: No cervical adenopathy.  Neurological:     Mental Status: She is alert.     LAB RESULTS:  CMP     Component Value Date/Time   NA 134 (L) 03/20/2022 1158   K 4.3 03/20/2022 1158   CL 101 03/20/2022 1158   CO2 28 03/20/2022 1158   GLUCOSE 99 03/20/2022 1158   BUN 16 03/20/2022 1158   CREATININE 0.94 03/20/2022 1158   CALCIUM  8.8 (L) 03/20/2022 1158   PROT 6.8 03/20/2022 1158   ALBUMIN 4.1 03/20/2022 1158   AST 16 03/20/2022 1158   ALT 14 03/20/2022 1158   ALKPHOS 33 (L) 03/20/2022 1158   BILITOT 0.3 03/20/2022 1158   GFRNONAA >60 03/20/2022 1158   GFRAA >60 04/12/2020 1140    GFRAA >60 05/12/2019 1145    No results found for: TOTALPROTELP, ALBUMINELP, A1GS, A2GS, BETS, BETA2SER, GAMS, MSPIKE, SPEI  No results found for: KPAFRELGTCHN, LAMBDASER, KAPLAMBRATIO  Lab Results  Component Value Date   WBC 5.0 03/20/2022   NEUTROABS 3.4 03/20/2022   HGB 11.9 (L) 03/20/2022   HCT 35.0 (L) 03/20/2022   MCV 91.4 03/20/2022   PLT 244 03/20/2022    No results found for: LABCA2  No  components found for: [id number]  No results for input(s): INR in the last 168 hours.  No results found for: LABCA2  No results found for: RJW800  No results found for: CAN125  No results found for: CAN153  No results found for: CA2729  No components found for: HGQUANT  No results found for: CEA1, CEA / No results found for: CEA1, CEA   No results found for: AFPTUMOR  No results found for: CHROMOGRNA  No results found for: HGBA, HGBA2QUANT, HGBFQUANT, HGBSQUAN (Hemoglobinopathy evaluation)   No results found for: LDH  No results found for: IRON, TIBC, IRONPCTSAT (Iron and TIBC)  No results found for: FERRITIN  Urinalysis No results found for: COLORURINE, APPEARANCEUR, LABSPEC, PHURINE, GLUCOSEU, HGBUR, BILIRUBINUR, KETONESUR, PROTEINUR, UROBILINOGEN, NITRITE, LEUKOCYTESUR   STUDIES: MM 3D DIAGNOSTIC MAMMOGRAM BILATERAL BREAST Result Date: 03/09/2024 CLINICAL DATA:  Patient with a history of a left lumpectomy for breast carcinoma performed in July 2020, treated with adjuvant radiation therapy. Patient currently on antiestrogen medication. She also reports lateral left breast pain for approximately 1 year. EXAM: DIGITAL DIAGNOSTIC BILATERAL MAMMOGRAM WITH TOMOSYNTHESIS AND CAD; ULTRASOUND LEFT BREAST LIMITED TECHNIQUE: Bilateral digital diagnostic mammography and breast tomosynthesis was performed. The images were evaluated with computer-aided detection. ; Targeted  ultrasound examination of the left breast was performed. COMPARISON:  Previous exam(s). ACR Breast Density Category c: The breasts are heterogeneously dense, which may obscure small masses. FINDINGS: There are stable post lumpectomy changes in the posterior, lower outer left breast. There are no breast masses, areas of significant asymmetry, areas of nonsurgical architectural distortion or suspicious calcifications. On physical exam, no mass is palpated throughout the lateral left breast. Targeted ultrasound is performed, showing normal fibroglandular tissue throughout the lateral left breast. Post lumpectomy scar is noted in the lower outer breast. No mass or suspicious finding. IMPRESSION: 1. No evidence of new or recurrent breast carcinoma. 2. Benign post lumpectomy changes on the left. RECOMMENDATION: Screening mammogram in one year.(Code:SM-B-01Y) I have discussed the findings and recommendations with the patient. If applicable, a reminder letter will be sent to the patient regarding the next appointment. BI-RADS CATEGORY  2: Benign. Electronically Signed   By: Alm Parkins M.D.   On: 03/09/2024 08:46   US  LIMITED ULTRASOUND INCLUDING AXILLA LEFT BREAST  Result Date: 03/09/2024 CLINICAL DATA:  Patient with a history of a left lumpectomy for breast carcinoma performed in July 2020, treated with adjuvant radiation therapy. Patient currently on antiestrogen medication. She also reports lateral left breast pain for approximately 1 year. EXAM: DIGITAL DIAGNOSTIC BILATERAL MAMMOGRAM WITH TOMOSYNTHESIS AND CAD; ULTRASOUND LEFT BREAST LIMITED TECHNIQUE: Bilateral digital diagnostic mammography and breast tomosynthesis was performed. The images were evaluated with computer-aided detection. ; Targeted ultrasound examination of the left breast was performed. COMPARISON:  Previous exam(s). ACR Breast Density Category c: The breasts are heterogeneously dense, which may obscure small masses. FINDINGS: There are stable  post lumpectomy changes in the posterior, lower outer left breast. There are no breast masses, areas of significant asymmetry, areas of nonsurgical architectural distortion or suspicious calcifications. On physical exam, no mass is palpated throughout the lateral left breast. Targeted ultrasound is performed, showing normal fibroglandular tissue throughout the lateral left breast. Post lumpectomy scar is noted in the lower outer breast. No mass or suspicious finding. IMPRESSION: 1. No evidence of new or recurrent breast carcinoma. 2. Benign post lumpectomy changes on the left. RECOMMENDATION: Screening mammogram in one year.(Code:SM-B-01Y) I have discussed the findings and recommendations with the patient. If applicable,  a reminder letter will be sent to the patient regarding the next appointment. BI-RADS CATEGORY  2: Benign. Electronically Signed   By: Alm Parkins M.D.   On: 03/09/2024 08:46     ELIGIBLE FOR AVAILABLE RESEARCH PROTOCOL: no  ASSESSMENT:   63 y.o. [place] woman status post left breast biopsy 02/12/2019 for a clinical T1b N0, stage IA invasive ductal carcinoma, estrogen receptor positive, progesterone receptor negative, HER-2 amplified, with an MIB-1-1 of 25%  (1) status post left lumpectomy and sentinel lymph node sampling 03/05/2019 for a pT1c pN0, stage IA invasive ductal carcinoma, grade 2, with positive lymphovascular invasion but negative margins  (a) a total of 8 lymph nodes removed (4 sentinel)   (2) adjuvant chemotherapy with paclitaxel  and trastuzumab  weekly x12 started 04/07/2019  (a) paclitaxel  stopped after 4 doses because of peripheral neuropathy  (b) cyclophosphamide , methotrexate , fluorouracil  started 05/12/2019, to be repeated q. 21 days x 3  (3) trastuzumab  continued to complete 1 year (through August 2021)  (a) echo 02/26/2019 shows an ejection fraction in the 60-65% range  (b) echo 05/27/2019 shows an EF of 55-60%  (c) echo 08/07/2019 shows an ejection  fraction in the 60-65% range  (d) echo 11/19/2019 shows an ejection fraction in the 60-65% range  (e) echo 03/03/2020 with an ejection fraction in the 55-60% range   (4) adjuvant radiation 07/15/2019 - 08/13/2019  (a) left breast / 40.05 Gy in 15 fractions  (b) boost / 12 Gy in 6 fractions  (5) tamoxifen  started 10/17/2019  (6) persistent left lateral breast mass  (a) ultrasound-guided biopsy 05/24/2020 shows benign lymph node tissue  (b) left mammography and ultrasonography 11/25/2020 shows small lymph node, stable  (6) genetics testing 02/25/2019 through the Multi-Gene Panel offered by [facility] found no deleterious mutations in AIP, ALK, APC, ATM, AXIN2,BAP1,  BARD1, BLM, BMPR1A, BRCA1, BRCA2, BRIP1, CASR, CDC73, CDH1, CDK4, CDKN1B, CDKN1C, CDKN2A (p14ARF), CDKN2A (p16INK4a), CEBPA, CHEK2, CTNNA1, DICER1, DIS3L2, EGFR (c.2369C>T, p.Thr790Met variant only), EPCAM (Deletion/duplication testing only), FH, FLCN, GATA2, GPC3, GREM1 (Promoter region deletion/duplication testing only), HOXB13 (c.251G>A, p.Gly84Glu), HRAS, KIT, MAX, MEN1, MET, MITF (c.952G>A, p.Glu318Lys variant only), MLH1, MSH2, MSH3, MSH6, MUTYH, NBN, NF1, NF2, NTHL1, PALB2, PDGFRA, PHOX2B, PMS2, POLD1, POLE, POT1, PRKAR1A, PTCH1, PTEN, RAD50, RAD51C, RAD51D, RB1, RECQL4, RET, RNF43, RUNX1, SDHAF2, SDHA (sequence changes only), SDHB, SDHC, SDHD, SMAD4, SMARCA4, SMARCB1, SMARCE1, STK11, SUFU, TERC, TERT, TMEM127, TP53, TSC1, TSC2, VHL, WRN and WT1.     PLAN:  Assessment and Plan Assessment & Plan Scar tissue pain Chronic pain due to fibrous scar tissue from previous lumpectomy. Imaging and biopsy showed no malignancy. - Reassured pain is related to scar tissue, unlikely malignancy. No palpable masses  Tamoxifen  therapy Ongoing tamoxifen  therapy for breast cancer until 2031. Benefits include improved bone density. Risks include increased risk of blood clots and endometrial cancer, especially after five years. - Continue  tamoxifen  therapy until 2031. - Monitor for signs of blood clots and endometrial cancer.  Osteopenia Osteopenia improved significantly, likely due to tamoxifen  and weight-bearing exercises. - Continue tamoxifen  therapy. - Continue weight-bearing exercises.  Hyperlipidemia Hyperlipidemia managed with rosuvastatin , effectively lowering LDL without affecting HDL. Muscle cramps noted as a side effect. - Consider rosuvastatin  every other day to reduce cramps especially since she has excellent control. - Discuss alternative medications like Repatha if cramps persist.   Total encounter time 30 minutes.*  *Total Encounter Time as defined by the Centers for Medicare and Medicaid Services includes, in addition to the face-to-face time of a  patient visit (documented in the note above) non-face-to-face time: obtaining and reviewing outside history, ordering and reviewing medications, tests or procedures, care coordination (communications with other health care professionals or caregivers) and documentation in the medical record.

## 2024-08-12 ENCOUNTER — Telehealth: Payer: Self-pay | Admitting: Hematology and Oncology

## 2024-08-12 NOTE — Telephone Encounter (Signed)
 I spoke to the pt regarding 03/22/25 appt being rescheduled to 03/22/25. Patient is aware of new appt date and time.

## 2025-03-10 ENCOUNTER — Encounter

## 2025-03-22 ENCOUNTER — Ambulatory Visit: Admitting: Hematology and Oncology

## 2025-03-23 ENCOUNTER — Inpatient Hospital Stay: Admitting: Hematology and Oncology
